# Patient Record
Sex: Male | Born: 1950
Health system: Southern US, Community
[De-identification: ages and names within clinical notes are randomized; demographics above are authoritative.]

## PROBLEM LIST (undated history)

## (undated) DIAGNOSIS — B019 Varicella without complication: Secondary | ICD-10-CM

## (undated) DIAGNOSIS — R7303 Prediabetes: Secondary | ICD-10-CM

## (undated) DIAGNOSIS — E785 Hyperlipidemia, unspecified: Secondary | ICD-10-CM

## (undated) DIAGNOSIS — M549 Dorsalgia, unspecified: Secondary | ICD-10-CM

## (undated) HISTORY — DX: Prediabetes: R73.03

## (undated) HISTORY — DX: Varicella without complication: B01.9

## (undated) HISTORY — DX: Hyperlipidemia, unspecified: E78.5

## (undated) HISTORY — DX: Dorsalgia, unspecified: M54.9

---

## 2006-11-02 ENCOUNTER — Ambulatory Visit: Payer: Self-pay | Admitting: Pulmonary Disease

## 2006-11-09 ENCOUNTER — Ambulatory Visit (HOSPITAL_BASED_OUTPATIENT_CLINIC_OR_DEPARTMENT_OTHER): Admission: RE | Admit: 2006-11-09 | Discharge: 2006-11-09 | Payer: Self-pay | Admitting: Pulmonary Disease

## 2006-11-16 ENCOUNTER — Ambulatory Visit: Payer: Self-pay | Admitting: Pulmonary Disease

## 2006-11-26 ENCOUNTER — Ambulatory Visit (HOSPITAL_COMMUNITY): Admission: RE | Admit: 2006-11-26 | Discharge: 2006-11-26 | Payer: Self-pay | Admitting: Pulmonary Disease

## 2006-12-17 ENCOUNTER — Ambulatory Visit: Payer: Self-pay | Admitting: Pulmonary Disease

## 2008-05-05 HISTORY — PX: OTHER SURGICAL HISTORY: SHX169

## 2008-05-16 ENCOUNTER — Emergency Department (HOSPITAL_BASED_OUTPATIENT_CLINIC_OR_DEPARTMENT_OTHER): Admission: EM | Admit: 2008-05-16 | Discharge: 2008-05-16 | Payer: Self-pay | Admitting: Emergency Medicine

## 2008-05-16 ENCOUNTER — Ambulatory Visit: Payer: Self-pay | Admitting: Diagnostic Radiology

## 2009-11-02 ENCOUNTER — Ambulatory Visit (HOSPITAL_BASED_OUTPATIENT_CLINIC_OR_DEPARTMENT_OTHER): Admission: RE | Admit: 2009-11-02 | Discharge: 2009-11-02 | Payer: Self-pay | Admitting: Orthopedic Surgery

## 2010-04-12 ENCOUNTER — Ambulatory Visit
Admission: RE | Admit: 2010-04-12 | Discharge: 2010-04-12 | Payer: Self-pay | Source: Home / Self Care | Attending: Orthopedic Surgery | Admitting: Orthopedic Surgery

## 2010-06-24 NOTE — Op Note (Signed)
  Justin Boyd, Justin Boyd              ACCOUNT NO.:  [id number]  MEDICAL RECORD NO.:  [id number]          PATIENT TYPE:  AMB  LOCATION:  DSC                          FACILITY:  [facility]  PHYSICIAN:  Cindee Salt, M.D.       DATE OF BIRTH:  December 19, 1950  DATE OF PROCEDURE:  04/12/2010 DATE OF DISCHARGE:                              OPERATIVE REPORT   PREOPERATIVE DIAGNOSIS:  Carpal tunnel syndrome, right hand.  POSTOPERATIVE DIAGNOSIS:  Carpal tunnel syndrome, right hand.  OPERATION:  Decompression of right median nerve.  SURGEON:  Cindee Salt, MD  ASSISTANT:  None.  ANESTHESIA:  Forearm-based IV regional with local infiltration.  ANESTHESIOLOGIST:  Germaine Pomfret, MD  HISTORY:  The patient is a 60 year old male with a history of carpal tunnel syndrome, EMG nerve conduction is positive, which has not responded to conservative treatment.  He has elected to undergo surgical decompression.  Pre, peri, and postoperative course have been discussed along with risks and complications.  He is aware that there is no guarantee with the surgery, possibility of infection, recurrence of injury to arteries, nerves, tendons, incomplete relief of symptoms, and dystrophy.  In the preoperative area, the patient is seen, the extremity marked by both the patient and surgeon, and antibiotic given.  PROCEDURE:  The patient was brought to the operating room where a forearm-based IV regional anesthetic was carried out without difficulty, was prepped using ChloraPrep, supine position with the right arm free. A 3-minute dry time was allowed.  Time-out taken confirming the patient and procedure.  A longitudinal incision was made in the palm and carried down through the subcutaneous tissue.  Bleeders were electrocauterized. Palmar fascia was split.  Superficial palmar arch identified.  The flexor tendon to the ring little finger identified.  To the ulnar side of the median nerve, the carpal retinaculum was  incised with sharp dissection.  A right-angle and Sewall retractor were placed between the skin and forearm fascia.  The fascia was released for approximately a 1.5 cm proximal to the wrist crease under direct vision.  The canal was explored.  The area of compression to the median nerve was apparent.  No further lesions were identified.  The wound was irrigated.  The skin was closed with interrupted 5-0 Vicryl Rapide sutures.  A local infiltration was given during the procedure with 0.25% Marcaine without epinephrine, approximately 5 mL was used.  Sterile compressive dressing and splint with the wrist fingers free was applied.  The patient tolerated the procedure well and was taken to the recovery room for observation in satisfactory condition.  He will be discharged to home to return to the [facility] [place] in 1 week, on [facility]win NX.          ______________________________ Cindee Salt, M.D.     GK/MEDQ  D:  04/12/2010  T:  04/13/2010  Job:  [id number]  Electronically Signed by Cindee Salt M.D. on 06/24/2010 12:33:24 PM

## 2010-08-20 ENCOUNTER — Ambulatory Visit (HOSPITAL_BASED_OUTPATIENT_CLINIC_OR_DEPARTMENT_OTHER)
Admission: RE | Admit: 2010-08-20 | Discharge: 2010-08-20 | Disposition: A | Discharge status: Home / Self Care | Payer: BC Managed Care – PPO | Source: Ambulatory Visit | Attending: Orthopedic Surgery | Admitting: Orthopedic Surgery

## 2010-08-20 DIAGNOSIS — M65849 Other synovitis and tenosynovitis, unspecified hand: Secondary | ICD-10-CM | POA: Insufficient documentation

## 2010-08-20 DIAGNOSIS — F17200 Nicotine dependence, unspecified, uncomplicated: Secondary | ICD-10-CM | POA: Insufficient documentation

## 2010-08-20 DIAGNOSIS — Z01812 Encounter for preprocedural laboratory examination: Secondary | ICD-10-CM | POA: Insufficient documentation

## 2010-08-20 DIAGNOSIS — M65839 Other synovitis and tenosynovitis, unspecified forearm: Secondary | ICD-10-CM | POA: Insufficient documentation

## 2010-08-20 DIAGNOSIS — M6530 Trigger finger, unspecified finger: Secondary | ICD-10-CM | POA: Insufficient documentation

## 2010-08-20 LAB — POCT HEMOGLOBIN-HEMACUE: Hemoglobin: 16.5 g/dL (ref 13.0–17.0)

## 2010-09-17 NOTE — Procedures (Signed)
NAMEBRETT, Justin Boyd              ACCOUNT NO.:  [id number]   MEDICAL RECORD NO.:  [id number]          PATIENT TYPE:  OUT   LOCATION:  SLEEP CENTER                 FACILITY:  [facility]   PHYSICIAN:  Barbaraann Share, MD,FCCPDATE OF BIRTH:  02[birth year]18-1951   DATE OF STUDY:  11/09/2006                            NOCTURNAL POLYSOMNOGRAM   REFERRING PHYSICIAN:  Barbaraann Share, MD,FCCP   INDICATION FOR STUDY:  Hypersomnia with sleep apnea, also history of  seizure disorder.   EPWORTH SLEEPINESS SCORE:  10.   MEDICATIONS:   SLEEP ARCHITECTURE:  The patient had total sleep time of 346 minutes  with no slow wave sleep and decreased REM.  Sleep onset latency was  normal and REM onset was prolonged.  Sleep efficiency was decreased at  82%.   RESPIRATORY DATA:  Patient was found to have 19 obstructive hypopneas, 8  obstructive apneas and 4 central apneas for an apnea/hypopnea index of 5  events/hour.  The events were not positional and there was moderate  snoring noted throughout.   OXYGEN DATA:  Patient had O2 desaturation as low as 85% with a few of  his obstructive events.   CARDIAC DATA:  No clinically significant cardiac arrhythmias were noted.   MOVEMENT-PARASOMNIA:  Patient was found to have small numbers of leg  jerks with no significant sleep disruption.  There were also 2  incidences noted where there was questionable seizure activity through  all leads in the expanded seizure montage.   IMPRESSIONS-RECOMMENDATIONS:  1. Very mild obstructive sleep apnea/hypopnea syndrome with an      apnea/hypopnea index of 5 events/hour and O2 desaturation as low as      85%.  Treatment for this may include weight loss alone if      applicable, upper airway surgery, oral appliance, and also CPAP.  2. Questionable seizure activity noted on 2 occasions during the      night.  This is not clear-cut and will be reviewed with a      neurologist.  The patient may ultimately need a sleep deprived EEG    for better clarification.      Barbaraann Share, MD,FCCP  Diplomate, American Board of Sleep  Medicine  Electronically Signed     [facility]/MEDQ  D:  11/18/2006 17:17:24  T:  11/19/2006 12:55:54  Job:  [id number]

## 2010-09-17 NOTE — Procedures (Signed)
EEG#:  [id number].   This is a sleep-deprived EEG in a patient noted to have epileptiform  activity on a sleep study EEG performed in July 2008.   No medications were listed.  This was a sleep-deprived study.   TECHNICAL DESCRIPTION:  This EEG was recorded during the awake state.  There was no stage-II sleep present during this recording.  There was  much low-voltage fast beta activity but the predominant background  rhythm is 14 Hz with higher amplitudes seen in the posterior head  regions like by eye-opening and accentuated by eye-closing maneuvers.  There is muscle artifact seen during much of this EEG.  Photic  stimulation was performed which did not produce a driving response.  Hyperventilation testing was performed without any definite  abnormalities present.  There was some drowsiness in the EEG but there  was no evidence of any epileptiform activity present.  There is much low-  voltage fast beta activity suggestive of medication effect, but none  were listed.   IMPRESSION:  This is a normal EEG during the awake state.  It was a  sleep-deprived study with much low-voltage fast beta activity suggestive  of medication effect.  No definite epileptiform activity was seen on  this study.           ______________________________  Genene Churn. Sandria Manly, M.D.     ZOX:WRUE  D:  11/26/2006 15:14:07  T:  11/27/2006 08:40:45  Job #:  [id number]   cc:   Barbaraann Share, MD,FCCP  [address]  [place]  [place] [address]

## 2010-09-17 NOTE — Procedures (Signed)
Justin Boyd, Justin Boyd              ACCOUNT NO.:  [id number]   MEDICAL RECORD NO.:  [id number]          PATIENT TYPE:  OUT   LOCATION:  SLEEP CENTER                 FACILITY:  [facility]   PHYSICIAN:  Barbaraann Share, MD,FCCPDATE OF BIRTH:  [birth year]Aug-1950   DATE OF STUDY:                            NOCTURNAL POLYSOMNOGRAM   REFERRING PHYSICIAN:   HISTORY OF PRESENT ILLNESS:  The patient is   DICTATION ENDS HERE.      Barbaraann Share, MD,FCCP  Diplomate, American Board of Sleep  Medicine     [facility]/MEDQ  D:  11/18/2006 17:14:02  T:  11/19/2006 12:39:29  Job:  [id number]

## 2010-09-17 NOTE — Assessment & Plan Note (Signed)
[facility]                             PULMONARY OFFICE NOTE   GENIE, MIRABAL                       MRN:          [id number]  DATE:11/02/2006                            DOB:          07/26/1950    The patient is a 60 year old gentleman who I have been asked to see for  unknown sleep issues.  The patient states that for the last 2 years he  has had significant sleepiness during the day with periods of  inactivity.  He works as a Visual merchandiser and feels that his  sleepiness interferes with his work.  He typically goes to bed between  9:30 and 10, and it typically takes him 15-30 minutes to get to sleep  after taking a Tylenol PM.  He typically arises at 4:40 a.m. to start  his day.  The patient states that he will awaken at least 6-8 times a  night and really does not know why.  He goes back to sleep usually  within minutes.  He does not have shortness of breath or GERD symptoms  whenever he arises.  The patient does states the he has leg jerks prior  to going to bed and describes it as an unusual sensation in his legs in  the evenings.  It clearly gets better if he gets up and tries to walk  around.  The patient does have some sleepiness with driving, but has not  been involved in any accidents.  He has lost 15 pounds over the last few  years.   PAST MEDICAL HISTORY:  1. Significant only for episodic vertigo.  2. Barrett's esophagus.  3. Questionable history of a seizure disorder.   CURRENT MEDICATIONS:  1. Tylenol PM 1 daily.  2. Zantac 150 daily.   The patient is intolerant/allergic to PENICILLIN which causes pain.   SOCIAL HISTORY:  The patient is married and does not have children.  He  has a history of smoking for 32 years.   FAMILY HISTORY:  Remarkable for mother and father both having heart  disease.   REVIEW OF SYSTEMS:  As per history of present illness, also see patient  intake form documented in the chart.   PHYSICAL  EXAMINATION:  IN GENERAL:  He is an overweight male in no acute  distress.  Blood pressure is 105/70, pulse 69, temperature 97.9, weight is 189  pounds, O2 saturation on room air is 94%.  HEENT:  Pupils are equal, round, and reactive to light and  accommodation, extraocular muscles are intact.  Nares show no  significant septal deviation.  Oropharynx does show mild elongation of  the soft palate and uvula.  NECK:  Supple without JVD or lymphadenopathy.  There is no palpable  thyromegaly.  CHEST:  Totally clear except for a few rhonchi.  CARDIAC EXAM:  Reveals regular rate and rhythm with no murmurs, rubs, or  gallops.  ABDOMEN:  Soft, nontender, good bowel sounds.  GENITAL/RECTAL/BREAST EXAM:  Not done and not indicated.  LOWER EXTREMITIES:  Without edema.  Pulses are intact distally.  NEUROLOGICAL:  He is alert and  oriented with no obvious motor deficits.   IMPRESSION:  Large number of awakenings during the night of unknown  etiology, and accompanied by poor sleep efficiency and inappropriate  daytime sleepiness which is interfering with his quality of life.  The  patient is not sure if he snores, and denies any gasping arousals.  He  does have leg jerks during the night and symptoms in the early evening  that may be consistent with restless leg syndrome.  He also has a  possible history of seizures, and I would wonder whether this is  spilling over into the evening.  At this point in time, he clearly needs  nocturnal polysomnogram to help with differential diagnosis.   PLAN:  1. Will order nocturnal polysomnogram with expanded seizure montage,      and also arm leads to rule out REM behavior disorder.  2. The patient will follow up after the above.     Barbaraann Share, MD,FCCP  Electronically Signed    KMC/MedQ  DD: 11/24/2006  DT: 11/24/2006  Job #: [id number]   cc:   Lieutenant Diego, MD, M.P.H.

## 2010-11-22 NOTE — Op Note (Signed)
  Justin Boyd, Justin Boyd              ACCOUNT NO.:  [id number]  MEDICAL RECORD NO.:  [id number]           PATIENT TYPE:  LOCATION:                                 FACILITY:  PHYSICIAN:  Cindee Salt, M.D.       DATE OF BIRTH:  December 30, 1950  DATE OF PROCEDURE:  08/20/2010 DATE OF DISCHARGE:                              OPERATIVE REPORT   PREOPERATIVE DIAGNOSIS:  Stenosing tenosynovitis, right thumb.  POSTOPERATIVE DIAGNOSIS:  Stenosing tenosynovitis, right thumb.  OPERATION:  Release A1 pulley, right thumb.  SURGEON:  Cindee Salt, MD  ASSISTANT:  Betha Loa, MD  ANESTHESIA:  Forearm-based IV regional with local infiltration.  HISTORY:  The patient is a 60 year old male with history of triggering of his right thumb.  This has not responded to conservative treatment, and he has elected to undergo surgical release of the A1 pulley.  Pre, peri, and postoperative course have been discussed along with risks and complications.  He is aware that there is no guarantee with the surgery, possibility of infection, recurrence, injury to arteries, nerves, tendons, incomplete relief of symptoms, dystrophy.  In the preoperative area, the patient was seen.  The extremity was marked by both the patient and surgeon, antibiotic given.  PROCEDURE:  The patient was brought to the operating room where forearm- based IV regional anesthetic was carried out without difficulty.  He was prepped using ChloraPrep in supine position, right arm free, 3-minute dry time was allowed.  Time-out taken confirming the patient and the procedure.  A transverse incision was made over the A1 pulley of the right thumb, carried down through subcutaneous tissue.  Neurovascular structures identified and protected, retractors were placed.  A1 pulley was then released on its radial aspect.  It was found to be extremely tight, the oblique pulley was left intact.  No further lesions were identified.  Thumb was placed through full  range motion, no further triggering was noted.  The wound was irrigated and closed with interrupted 5-0 Vicryl Rapide suture.  Local infiltration with 0.25% Marcaine without epinephrine was given, approximately 4 mL was used.  A sterile compressive dressing with thumb free was applied.  On inflation of the tourniquet, all fingers immediately pinked.  He was taken to the recovery room for observation in satisfactory condition.  He will be discharged home to return to the [facility] [place] in 1 week on [place]win NX.          ______________________________ Cindee Salt, M.D.     GK/MEDQ  D:  08/20/2010  T:  08/20/2010  Job:  [id number]  Electronically Signed by Cindee Salt M.D. on 11/22/2010 09:09:27 AM

## 2011-02-27 DIAGNOSIS — E785 Hyperlipidemia, unspecified: Secondary | ICD-10-CM

## 2011-02-27 HISTORY — DX: Hyperlipidemia, unspecified: E78.5

## 2011-12-04 DIAGNOSIS — K5790 Diverticulosis of intestine, part unspecified, without perforation or abscess without bleeding: Secondary | ICD-10-CM | POA: Insufficient documentation

## 2011-12-04 HISTORY — DX: Diverticulosis of intestine, part unspecified, without perforation or abscess without bleeding: K57.90

## 2014-05-05 HISTORY — PX: SHOULDER SURGERY: SHX246

## 2017-01-04 DIAGNOSIS — M545 Low back pain: Secondary | ICD-10-CM | POA: Diagnosis not present

## 2017-04-06 DIAGNOSIS — M7712 Lateral epicondylitis, left elbow: Secondary | ICD-10-CM | POA: Diagnosis not present

## 2017-04-17 DIAGNOSIS — M25522 Pain in left elbow: Secondary | ICD-10-CM | POA: Diagnosis not present

## 2017-04-17 DIAGNOSIS — M7712 Lateral epicondylitis, left elbow: Secondary | ICD-10-CM | POA: Diagnosis not present

## 2017-04-26 DIAGNOSIS — R0781 Pleurodynia: Secondary | ICD-10-CM | POA: Diagnosis not present

## 2017-04-26 DIAGNOSIS — S299XXA Unspecified injury of thorax, initial encounter: Secondary | ICD-10-CM | POA: Diagnosis not present

## 2017-04-26 DIAGNOSIS — R0789 Other chest pain: Secondary | ICD-10-CM | POA: Diagnosis not present

## 2017-04-26 DIAGNOSIS — T148XXA Other injury of unspecified body region, initial encounter: Secondary | ICD-10-CM | POA: Diagnosis not present

## 2017-04-26 DIAGNOSIS — W19XXXA Unspecified fall, initial encounter: Secondary | ICD-10-CM | POA: Diagnosis not present

## 2017-04-29 DIAGNOSIS — M7712 Lateral epicondylitis, left elbow: Secondary | ICD-10-CM | POA: Diagnosis not present

## 2017-04-29 DIAGNOSIS — M25522 Pain in left elbow: Secondary | ICD-10-CM | POA: Diagnosis not present

## 2017-05-06 DIAGNOSIS — M25522 Pain in left elbow: Secondary | ICD-10-CM | POA: Diagnosis not present

## 2017-05-06 DIAGNOSIS — M7712 Lateral epicondylitis, left elbow: Secondary | ICD-10-CM | POA: Diagnosis not present

## 2017-05-13 DIAGNOSIS — M25522 Pain in left elbow: Secondary | ICD-10-CM | POA: Diagnosis not present

## 2017-05-13 DIAGNOSIS — M7712 Lateral epicondylitis, left elbow: Secondary | ICD-10-CM | POA: Diagnosis not present

## 2017-07-15 DIAGNOSIS — Z713 Dietary counseling and surveillance: Secondary | ICD-10-CM | POA: Diagnosis not present

## 2017-11-13 DIAGNOSIS — Z79899 Other long term (current) drug therapy: Secondary | ICD-10-CM | POA: Diagnosis not present

## 2017-11-13 DIAGNOSIS — Z5181 Encounter for therapeutic drug level monitoring: Secondary | ICD-10-CM | POA: Diagnosis not present

## 2018-02-01 DIAGNOSIS — N289 Disorder of kidney and ureter, unspecified: Secondary | ICD-10-CM | POA: Diagnosis not present

## 2018-02-01 DIAGNOSIS — K6389 Other specified diseases of intestine: Secondary | ICD-10-CM | POA: Diagnosis not present

## 2018-02-01 DIAGNOSIS — N2889 Other specified disorders of kidney and ureter: Secondary | ICD-10-CM | POA: Diagnosis not present

## 2018-02-01 DIAGNOSIS — K2270 Barrett's esophagus without dysplasia: Secondary | ICD-10-CM | POA: Diagnosis not present

## 2018-02-01 DIAGNOSIS — Z86010 Personal history of colonic polyps: Secondary | ICD-10-CM | POA: Diagnosis not present

## 2018-02-01 DIAGNOSIS — I77811 Abdominal aortic ectasia: Secondary | ICD-10-CM | POA: Diagnosis not present

## 2018-02-01 DIAGNOSIS — R1010 Upper abdominal pain, unspecified: Secondary | ICD-10-CM | POA: Diagnosis not present

## 2018-02-01 DIAGNOSIS — K219 Gastro-esophageal reflux disease without esophagitis: Secondary | ICD-10-CM | POA: Diagnosis not present

## 2018-02-05 DIAGNOSIS — K2930 Chronic superficial gastritis without bleeding: Secondary | ICD-10-CM | POA: Diagnosis not present

## 2018-02-05 DIAGNOSIS — K2270 Barrett's esophagus without dysplasia: Secondary | ICD-10-CM | POA: Diagnosis not present

## 2018-02-05 DIAGNOSIS — R933 Abnormal findings on diagnostic imaging of other parts of digestive tract: Secondary | ICD-10-CM | POA: Diagnosis not present

## 2018-02-05 DIAGNOSIS — K210 Gastro-esophageal reflux disease with esophagitis: Secondary | ICD-10-CM | POA: Diagnosis not present

## 2018-02-05 DIAGNOSIS — K317 Polyp of stomach and duodenum: Secondary | ICD-10-CM | POA: Diagnosis not present

## 2018-02-05 DIAGNOSIS — R1010 Upper abdominal pain, unspecified: Secondary | ICD-10-CM | POA: Diagnosis not present

## 2018-02-05 DIAGNOSIS — K3189 Other diseases of stomach and duodenum: Secondary | ICD-10-CM | POA: Diagnosis not present

## 2018-02-05 DIAGNOSIS — K2950 Unspecified chronic gastritis without bleeding: Secondary | ICD-10-CM | POA: Diagnosis not present

## 2018-02-05 DIAGNOSIS — K449 Diaphragmatic hernia without obstruction or gangrene: Secondary | ICD-10-CM | POA: Diagnosis not present

## 2018-02-18 DIAGNOSIS — N289 Disorder of kidney and ureter, unspecified: Secondary | ICD-10-CM | POA: Diagnosis not present

## 2018-02-18 DIAGNOSIS — K869 Disease of pancreas, unspecified: Secondary | ICD-10-CM | POA: Diagnosis not present

## 2018-02-18 DIAGNOSIS — N2889 Other specified disorders of kidney and ureter: Secondary | ICD-10-CM | POA: Diagnosis not present

## 2018-02-18 DIAGNOSIS — K8689 Other specified diseases of pancreas: Secondary | ICD-10-CM | POA: Diagnosis not present

## 2018-03-26 ENCOUNTER — Ambulatory Visit (INDEPENDENT_AMBULATORY_CARE_PROVIDER_SITE_OTHER): Payer: BLUE CROSS/BLUE SHIELD | Admitting: Family Medicine

## 2018-03-26 ENCOUNTER — Encounter: Payer: Self-pay | Admitting: Family Medicine

## 2018-03-26 ENCOUNTER — Ambulatory Visit: Payer: Self-pay | Admitting: Family Medicine

## 2018-03-26 ENCOUNTER — Ambulatory Visit (INDEPENDENT_AMBULATORY_CARE_PROVIDER_SITE_OTHER): Payer: BLUE CROSS/BLUE SHIELD

## 2018-03-26 VITALS — BP 160/82 | HR 83 | Ht 64.0 in | Wt 194.4 lb

## 2018-03-26 DIAGNOSIS — Z23 Encounter for immunization: Secondary | ICD-10-CM

## 2018-03-26 DIAGNOSIS — R0602 Shortness of breath: Secondary | ICD-10-CM

## 2018-03-26 DIAGNOSIS — R9389 Abnormal findings on diagnostic imaging of other specified body structures: Secondary | ICD-10-CM | POA: Diagnosis not present

## 2018-03-26 DIAGNOSIS — Z0001 Encounter for general adult medical examination with abnormal findings: Secondary | ICD-10-CM

## 2018-03-26 DIAGNOSIS — Z720 Tobacco use: Secondary | ICD-10-CM

## 2018-03-26 DIAGNOSIS — R030 Elevated blood-pressure reading, without diagnosis of hypertension: Secondary | ICD-10-CM

## 2018-03-26 MED ORDER — VARENICLINE TARTRATE 0.5 MG X 11 & 1 MG X 42 PO MISC: ORAL | 0 refills | Status: DC

## 2018-03-26 NOTE — Progress Notes (Addendum)
New Patient Office Visit  Subjective:  Patient ID: Justin Boyd, male    DOB: Jul 22, 1950  Age: 67 y.o. MRN: [id number]  CC:  Chief Complaint  Patient presents with  . Establish Care    HPI Mirza Fessel presents for establishment of care. He is married and lives with his wife. Both of his parents are deceased as well as her sister. His brother is still living. Patient is in agreement to get his first pneumonia vaccine and would also like the new shingles vaccine. Patient has regular insurance, not medicare. He has already had his flu vaccine and had a colonoscopy in 2013.  Patient is here today for a physical exam and to establish care.  He is not fasting.  He continues to work.  He wants to quit smoking and is interested in the possibility of taking Chantix to help him quit.  He has read about it online.  His exercise capacity has been due to shortness of breath he experienced on exertion.  He does feel his heart race at times with this.  He has no chest pain nausea or diaphoresis with exertion.  His cholesterol has been treated in the past but he was lost to follow-up.  He has no history of hypertension.  Recently his blood pressure has been up and down.  Last colonoscopy was 3 years ago.  He is on the 5-year plan with [facility] in in [place].  Endoscopy 2 years ago was normal.  He takes Protonix a regular basis.    Past Medical History:  Diagnosis Date  . Chicken pox   . Hyperlipidemia     Past Surgical History:  Procedure Laterality Date  . carpal tunnel both hands  2010  . SHOULDER SURGERY Right 2016   History reviewed. No pertinent family history.  History reviewed. No pertinent family history.  Social History   Socioeconomic History  . Marital status: Married    Spouse name: Not on file  . Number of children: Not on file  . Years of education: Not on file  . Highest education level: Not on file  Occupational History  . Not on file  Social Needs  . Financial  resource strain: Not on file  . Food insecurity:    Worry: Not on file    Inability: Not on file  . Transportation needs:    Medical: Not on file    Non-medical: Not on file  Tobacco Use  . Smoking status: Current Every Day Smoker  . Smokeless tobacco: Never Used  Substance and Sexual Activity  . Alcohol use: Yes  . Drug use: Never  . Sexual activity: Yes    Partners: Female  Lifestyle  . Physical activity:    Days per week: Not on file    Minutes per session: Not on file  . Stress: Not on file  Relationships  . Social connections:    Talks on phone: Not on file    Gets together: Not on file    Attends religious service: Not on file    Active member of club or organization: Not on file    Attends meetings of clubs or organizations: Not on file    Relationship status: Not on file  . Intimate partner violence:    Fear of current or ex partner: Not on file    Emotionally abused: Not on file    Physically abused: Not on file    Forced sexual activity: Not on file  Other Topics  Concern  . Not on file  Social History Narrative  . Not on file    ROS Review of Systems  Constitutional: Negative for chills, diaphoresis, fatigue, fever and unexpected weight change.  HENT: Negative.   Eyes: Negative for photophobia and visual disturbance.  Respiratory: Positive for shortness of breath. Negative for chest tightness and wheezing.   Cardiovascular: Positive for palpitations. Negative for chest pain and leg swelling.  Gastrointestinal: Negative.   Endocrine: Negative for polyphagia and polyuria.  Genitourinary: Positive for difficulty urinating.  Musculoskeletal: Negative for gait problem and joint swelling.  Skin: Negative for pallor and rash.  Allergic/Immunologic: Negative for immunocompromised state.  Neurological: Negative for light-headedness, numbness and headaches.  Hematological: Does not bruise/bleed easily.  Psychiatric/Behavioral: Negative.     Objective:    Today's Vitals: BP (!) 160/82 (BP Location: Left Arm, Patient Position: Sitting, Cuff Size: Normal)   Pulse 83   Ht 5\' 4"  (1.626 m)   Wt 194 lb 6 oz (88.2 kg)   SpO2 92%   BMI 33.36 kg/m   Physical Exam  Constitutional: He is oriented to person, place, and time. He appears well-developed and well-nourished. No distress.  HENT:  Head: Normocephalic and atraumatic.  Right Ear: External ear normal.  Left Ear: External ear normal.  Mouth/Throat: Oropharynx is clear and moist. No oropharyngeal exudate.  Eyes: Pupils are equal, round, and reactive to light. Conjunctivae and EOM are normal. Right eye exhibits no discharge. Left eye exhibits no discharge. No scleral icterus.  Neck: Neck supple. No JVD present. No tracheal deviation present. No thyromegaly present.  Cardiovascular: Normal rate, regular rhythm and normal heart sounds.  Pulmonary/Chest: Effort normal.  Abdominal: Soft. Bowel sounds are normal. He exhibits no distension and no mass. There is no tenderness. There is no rebound and no guarding. A hernia is present. Hernia confirmed positive in the ventral area.  Genitourinary: Rectal exam shows no external hemorrhoid, no internal hemorrhoid, no fissure, no mass, no tenderness, anal tone normal and guaiac negative stool. Prostate is not enlarged and not tender.  Lymphadenopathy:    He has no cervical adenopathy.  Neurological: He is alert and oriented to person, place, and time.  Skin: Skin is warm and dry. He is not diaphoretic.  Psychiatric: He has a normal mood and affect.  The 10-year ASCVD risk score Mikey Bussing DC Brooke Bonito., et al., 2013) is: 30.9%   Values used to calculate the score:     Age: 89 years     Sex: Male     Is Non-Hispanic African American: No     Diabetic: No     Tobacco smoker: Yes     Systolic Blood Pressure: 751 mmHg     Is BP treated: No     HDL Cholesterol: 34.6 mg/dL     Total Cholesterol: 211 mg/dL  Assessment & Plan:   Problem List Items Addressed This  Visit      Other   Encounter for health maintenance examination with abnormal findings - Primary   Relevant Orders   CBC (Completed)   Comprehensive metabolic panel (Completed)   LDL cholesterol, direct (Completed)   Lipid panel (Completed)   PSA (Completed)   TSH (Completed)   VITAMIN D 25 Hydroxy (Vit-D Deficiency, Fractures) (Completed)   SOB (shortness of breath) on exertion   Relevant Orders   DG Chest 2 View (Completed)   Ambulatory referral to Pulmonology   Elevated BP without diagnosis of hypertension   Need for 23-polyvalent pneumococcal polysaccharide vaccine  Relevant Orders   Pneumococcal polysaccharide vaccine 23-valent greater than or equal to 2yo subcutaneous/IM (Completed)   Tobacco abuse   Relevant Medications   varenicline (CHANTIX PAK) 0.5 MG X 11 & 1 MG X 42 tablet   Abnormal CXR      Outpatient Encounter Medications as of 03/26/2018  Medication Sig  . pantoprazole (PROTONIX) 40 MG tablet Take 40 mg by mouth daily.  . varenicline (CHANTIX PAK) 0.5 MG X 11 & 1 MG X 42 tablet Take one 0.5 mg tablet by mouth once daily for 3 days, then increase to one 0.5 mg tablet twice daily for 4 days, then increase to one 1 mg tablet twice daily.   No facility-administered encounter medications on file as of 03/26/2018.    We discussed starting Chantix 1 week prior to his quit date.  He is aware of the possible side effects of depression and homicidal ideation.  If these occur he will stop the medication and inform me immediately.  He is to follow-up with me 1 month after starting the medicine.  Advised him to return for a blood pressure check in 2 weeks.  He will have his blood drawn med center.  He was given anticipatory guidance on health maintenance and disease prevention.  Given information on the DASH diet. Follow-up: Return in about 2 weeks (around 04/09/2018).

## 2018-03-26 NOTE — Patient Instructions (Addendum)
You received two vaccines today; your 1st pneumonia vaccine and your 1st shingles vaccine. Pain, redness, and swelling at the injection site, muscle pain, tiredness, headache, shivering, fever, and upset stomach are all common side effects of SHINGRIX. You will receive your second pneumonia vaccine in one year, and your second shingles vaccine in 2-6 months. Recombinant Zoster (Shingles) Vaccine, RZV: What You Need to Know 1. Why get vaccinated? Shingles (also called herpes zoster, or just zoster) is a painful skin rash, often with blisters. Shingles is caused by the varicella zoster virus, the same virus that causes chickenpox. After you have chickenpox, the virus stays in your body and can cause shingles later in life. You can't catch shingles from another person. However, a person who has never had chickenpox (or chickenpox vaccine) could get chickenpox from someone with shingles. A shingles rash usually appears on one side of the face or body and heals within 2 to 4 weeks. Its main symptom is pain, which can be severe. Other symptoms can include fever, headache, chills and upset stomach. Very rarely, a shingles infection can lead to pneumonia, hearing problems, blindness, brain inflammation (encephalitis), or death. For about 1 person in 5, severe pain can continue even long after the rash has cleared up. This long-lasting pain is called post-herpetic neuralgia (PHN). Shingles is far more common in people 6 years of age and older than in younger people, and the risk increases with age. It is also more common in people whose immune system is weakened because of a disease such as cancer, or by drugs such as steroids or chemotherapy. At least 1 million people a year in the [place] get shingles. 2. Shingles vaccine (recombinant) Recombinant shingles vaccine was approved by FDA in 2017 for the prevention of shingles. In clinical trials, it was more than 90% effective in preventing shingles. It can  also reduce the likelihood of PHN. Two doses, 2 to 6 months apart, are recommended for adults 70 and older. This vaccine is also recommended for people who have already gotten the live shingles vaccine (Zostavax). There is no live virus in this vaccine. 3. Some people should not get this vaccine Tell your vaccine provider if you:  Have any severe, life-threatening allergies. A person who has ever had a life-threatening allergic reaction after a dose of recombinant shingles vaccine, or has a severe allergy to any component of this vaccine, may be advised not to be vaccinated. Ask your health care provider if you want information about vaccine components.  Are pregnant or breastfeeding. There is not much information about use of recombinant shingles vaccine in pregnant or nursing women. Your healthcare provider might recommend delaying vaccination.  Are not feeling well. If you have a mild illness, such as a cold, you can probably get the vaccine today. If you are moderately or severely ill, you should probably wait until you recover. Your doctor can advise you.  4. Risks of a vaccine reaction With any medicine, including vaccines, there is a chance of reactions. After recombinant shingles vaccination, a person might experience:  Pain, redness, soreness, or swelling at the site of the injection  Headache, muscle aches, fever, shivering, fatigue  In clinical trials, most people got a sore arm with mild or moderate pain after vaccination, and some also had redness and swelling where they got the shot. Some people felt tired, had muscle pain, a headache, shivering, fever, stomach pain, or nausea. About 1 out of 6 people who got recombinant zoster vaccine  experienced side effects that prevented them from doing regular activities. Symptoms went away on their own in about 2 to 3 days. Side effects were more common in younger people. You should still get the second dose of recombinant zoster vaccine even  if you had one of these reactions after the first dose. Other things that could happen after this vaccine:  People sometimes faint after medical procedures, including vaccination. Sitting or lying down for about 15 minutes can help prevent fainting and injuries caused by a fall. Tell your provider if you feel dizzy or have vision changes or ringing in the ears.  Some people get shoulder pain that can be more severe and longer-lasting than routine soreness that can follow injections. This happens very rarely.  Any medication can cause a severe allergic reaction. Such reactions to a vaccine are estimated at about 1 in a million doses, and would happen within a few minutes to a few hours after the vaccination. As with any medicine, there is a very remote chance of a vaccine causing a serious injury or death. The safety of vaccines is always being monitored. For more information, visit: http://www.aguilar.org/ 5. What if there is a serious problem? What should I look for?  Look for anything that concerns you, such as signs of a severe allergic reaction, very high fever, or unusual behavior. Signs of a severe allergic reaction can include hives, swelling of the face and throat, difficulty breathing, a fast heartbeat, dizziness, and weakness. These would usually start a few minutes to a few hours after the vaccination. What should I do?  If you think it is a severe allergic reaction or other emergency that can't wait, call 9-1-1 and get to the nearest hospital. Otherwise, call your health care provider. Afterward, the reaction should be reported to the Vaccine Adverse Event Reporting System (VAERS). Your doctor should file this report, or you can do it yourself through the VAERS web site atwww.vaers.https://www.bray.com/ by calling 256-234-2919. VAERS does not give medical advice. 6. How can I learn more?  Ask your healthcare provider. He or she can give you the vaccine package insert or suggest other  sources of information.  Call your local or state health department.  Contact the Centers for Disease Control and Prevention (CDC): ? Call (210) 569-9227 (1-800-CDC-INFO) or ? Visit the CDC's website at http://hunter.com/ CDC Vaccine Information Statement (VIS) Recombinant Zoster Vaccine (06/16/2016) This information is not intended to replace advice given to you by your health care provider. Make sure you discuss any questions you have with your health care provider. Document Released: 07/01/2016 Document Revised: 07/01/2016 Document Reviewed: 07/01/2016 Elsevier Interactive Patient Education  2018 [place] Maintenance, Male A healthy lifestyle and preventive care is important for your health and wellness. Ask your health care provider about what schedule of regular examinations is right for you. What should I know about weight and diet? Eat a Healthy Diet  Eat plenty of vegetables, fruits, whole grains, low-fat dairy products, and lean protein.  Do not eat a lot of foods high in solid fats, added sugars, or salt.  Maintain a Healthy Weight Regular exercise can help you achieve or maintain a healthy weight. You should:  Do at least 150 minutes of exercise each week. The exercise should increase your heart rate and make you sweat (moderate-intensity exercise).  Do strength-training exercises at least twice a week.  Watch Your Levels of Cholesterol and Blood Lipids  Have your blood tested for lipids and cholesterol every  5 years starting at 65 years of age. If you are at high risk for heart disease, you should start having your blood tested when you are 65 years old. You may need to have your cholesterol levels checked more often if: ? Your lipid or cholesterol levels are high. ? You are older than 65 years of age. ? You are at high risk for heart disease.  What should I know about cancer screening? Many types of cancers can be detected early and may often be  prevented. Lung Cancer  You should be screened every year for lung cancer if: ? You are a current smoker who has smoked for at least 30 years. ? You are a former smoker who has quit within the past 15 years.  Talk to your health care provider about your screening options, when you should start screening, and how often you should be screened.  Colorectal Cancer  Routine colorectal cancer screening usually begins at 65 years of age and should be repeated every 5-10 years until you are 65 years old. You may need to be screened more often if early forms of precancerous polyps or small growths are found. Your health care provider may recommend screening at an earlier age if you have risk factors for colon cancer.  Your health care provider may recommend using home test kits to check for hidden blood in the stool.  A small camera at the end of a tube can be used to examine your colon (sigmoidoscopy or colonoscopy). This checks for the earliest forms of colorectal cancer.  Prostate and Testicular Cancer  Depending on your age and overall health, your health care provider may do certain tests to screen for prostate and testicular cancer.  Talk to your health care provider about any symptoms or concerns you have about testicular or prostate cancer.  Skin Cancer  Check your skin from head to toe regularly.  Tell your health care provider about any new moles or changes in moles, especially if: ? There is a change in a mole's size, shape, or color. ? You have a mole that is larger than a pencil eraser.  Always use sunscreen. Apply sunscreen liberally and repeat throughout the day.  Protect yourself by wearing long sleeves, pants, a wide-brimmed hat, and sunglasses when outside.  What should I know about heart disease, diabetes, and high blood pressure?  If you are 68-21 years of age, have your blood pressure checked every 3-5 years. If you are 70 years of age or older, have your blood  pressure checked every year. You should have your blood pressure measured twice-once when you are at a hospital or clinic, and once when you are not at a hospital or clinic. Record the average of the two measurements. To check your blood pressure when you are not at a hospital or clinic, you can use: ? An automated blood pressure machine at a pharmacy. ? A home blood pressure monitor.  Talk to your health care provider about your target blood pressure.  If you are between 3-22 years old, ask your health care provider if you should take aspirin to prevent heart disease.  Have regular diabetes screenings by checking your fasting blood sugar level. ? If you are at a normal weight and have a low risk for diabetes, have this test once every three years after the age of 92. ? If you are overweight and have a high risk for diabetes, consider being tested at a younger age or more  often.  A one-time screening for abdominal aortic aneurysm (AAA) by ultrasound is recommended for men aged 86-75 years who are current or former smokers. What should I know about preventing infection? Hepatitis B If you have a higher risk for hepatitis B, you should be screened for this virus. Talk with your health care provider to find out if you are at risk for hepatitis B infection. Hepatitis C Blood testing is recommended for:  Everyone born from [birth year] through 1963.  Anyone with known risk factors for hepatitis C.  Sexually Transmitted Diseases (STDs)  You should be screened each year for STDs including gonorrhea and chlamydia if: ? You are sexually active and are younger than 65 years of age. ? You are older than 65 years of age and your health care provider tells you that you are at risk for this type of infection. ? Your sexual activity has changed since you were last screened and you are at an increased risk for chlamydia or gonorrhea. Ask your health care provider if you are at risk.  Talk with your health  care provider about whether you are at high risk of being infected with HIV. Your health care provider may recommend a prescription medicine to help prevent HIV infection.  What else can I do?  Schedule regular health, dental, and eye exams.  Stay current with your vaccines (immunizations).  Do not use any tobacco products, such as cigarettes, chewing tobacco, and e-cigarettes. If you need help quitting, ask your health care provider.  Limit alcohol intake to no more than 2 drinks per day. One drink equals 12 ounces of beer, 5 ounces of wine, or 1 ounces of hard liquor.  Do not use street drugs.  Do not share needles.  Ask your health care provider for help if you need support or information about quitting drugs.  Tell your health care provider if you often feel depressed.  Tell your health care provider if you have ever been abused or do not feel safe at home. This information is not intended to replace advice given to you by your health care provider. Make sure you discuss any questions you have with your health care provider. Document Released: 10/18/2007 Document Revised: 12/19/2015 Document Reviewed: 01/23/2015 Elsevier Interactive Patient Education  2018 [place] Eating Plan DASH stands for "Dietary Approaches to Stop Hypertension." The DASH eating plan is a healthy eating plan that has been shown to reduce high blood pressure (hypertension). It may also reduce your risk for type 2 diabetes, heart disease, and stroke. The DASH eating plan may also help with weight loss. What are tips for following this plan? General guidelines  Avoid eating more than 2,300 mg (milligrams) of salt (sodium) a day. If you have hypertension, you may need to reduce your sodium intake to 1,500 mg a day.  Limit alcohol intake to no more than 1 drink a day for nonpregnant women and 2 drinks a day for men. One drink equals 12 oz of beer, 5 oz of wine, or 1 oz of hard liquor.  Work with  your health care provider to maintain a healthy body weight or to lose weight. Ask what an ideal weight is for you.  Get at least 30 minutes of exercise that causes your heart to beat faster (aerobic exercise) most days of the week. Activities may include walking, swimming, or biking.  Work with your health care provider or diet and nutrition specialist (dietitian) to adjust your eating plan to  your individual calorie needs. Reading food labels  Check food labels for the amount of sodium per serving. Choose foods with less than 5 percent of the Daily Value of sodium. Generally, foods with less than 300 mg of sodium per serving fit into this eating plan.  To find whole grains, look for the word "whole" as the first word in the ingredient list. Shopping  Buy products labeled as "low-sodium" or "no salt added."  Buy fresh foods. Avoid canned foods and premade or frozen meals. Cooking  Avoid adding salt when cooking. Use salt-free seasonings or herbs instead of table salt or sea salt. Check with your health care provider or pharmacist before using salt substitutes.  Do not fry foods. Cook foods using healthy methods such as baking, boiling, grilling, and broiling instead.  Cook with heart-healthy oils, such as olive, canola, soybean, or sunflower oil. Meal planning   Eat a balanced diet that includes: ? 5 or more servings of fruits and vegetables each day. At each meal, try to fill half of your plate with fruits and vegetables. ? Up to 6-8 servings of whole grains each day. ? Less than 6 oz of lean meat, poultry, or fish each day. A 3-oz serving of meat is about the same size as a deck of cards. One egg equals 1 oz. ? 2 servings of low-fat dairy each day. ? A serving of nuts, seeds, or beans 5 times each week. ? Heart-healthy fats. Healthy fats called Omega-3 fatty acids are found in foods such as flaxseeds and coldwater fish, like sardines, salmon, and mackerel.  Limit how much you eat  of the following: ? Canned or prepackaged foods. ? Food that is high in trans fat, such as fried foods. ? Food that is high in saturated fat, such as fatty meat. ? Sweets, desserts, sugary drinks, and other foods with added sugar. ? Full-fat dairy products.  Do not salt foods before eating.  Try to eat at least 2 vegetarian meals each week.  Eat more home-cooked food and less restaurant, buffet, and fast food.  When eating at a restaurant, ask that your food be prepared with less salt or no salt, if possible. What foods are recommended? The items listed may not be a complete list. Talk with your dietitian about what dietary choices are best for you. Grains Whole-grain or whole-wheat bread. Whole-grain or whole-wheat pasta. Brown rice. [place]. Bulgur. Whole-grain and low-sodium cereals. Pita bread. Low-fat, low-sodium crackers. Whole-wheat flour tortillas. Vegetables Fresh or frozen vegetables (raw, steamed, roasted, or grilled). Low-sodium or reduced-sodium tomato and vegetable juice. Low-sodium or reduced-sodium tomato sauce and tomato paste. Low-sodium or reduced-sodium canned vegetables. Fruits All fresh, dried, or frozen fruit. Canned fruit in natural juice (without added sugar). Meat and other protein foods Skinless chicken or [place]. Ground chicken or [place]. Pork with fat trimmed off. Fish and seafood. Egg whites. Dried beans, peas, or lentils. Unsalted nuts, nut butters, and seeds. Unsalted canned beans. Lean cuts of beef with fat trimmed off. Low-sodium, lean deli meat. Dairy Low-fat (1%) or fat-free (skim) milk. Fat-free, low-fat, or reduced-fat cheeses. Nonfat, low-sodium ricotta or cottage cheese. Low-fat or nonfat yogurt. Low-fat, low-sodium cheese. Fats and oils Soft margarine without trans fats. Vegetable oil. Low-fat, reduced-fat, or light mayonnaise and salad dressings (reduced-sodium). Canola, safflower, olive, soybean, and sunflower oils. Avocado. Seasoning and  other foods Herbs. Spices. Seasoning mixes without salt. Unsalted popcorn and pretzels. Fat-free sweets. What foods are not recommended? The items listed may not  be a complete list. Talk with your dietitian about what dietary choices are best for you. Grains Baked goods made with fat, such as croissants, muffins, or some breads. Dry pasta or rice meal packs. Vegetables Creamed or fried vegetables. Vegetables in a cheese sauce. Regular canned vegetables (not low-sodium or reduced-sodium). Regular canned tomato sauce and paste (not low-sodium or reduced-sodium). Regular tomato and vegetable juice (not low-sodium or reduced-sodium). Angie Fava. Olives. Fruits Canned fruit in a light or heavy syrup. Fried fruit. Fruit in cream or butter sauce. Meat and other protein foods Fatty cuts of meat. Ribs. Fried meat. Berniece Salines. Sausage. Bologna and other processed lunch meats. Salami. Fatback. Hotdogs. Bratwurst. Salted nuts and seeds. Canned beans with added salt. Canned or smoked fish. Whole eggs or egg yolks. Chicken or [place] with skin. Dairy Whole or 2% milk, cream, and half-and-half. Whole or full-fat cream cheese. Whole-fat or sweetened yogurt. Full-fat cheese. Nondairy creamers. Whipped toppings. Processed cheese and cheese spreads. Fats and oils Butter. Stick margarine. Lard. Shortening. Ghee. Bacon fat. Tropical oils, such as coconut, palm kernel, or palm oil. Seasoning and other foods Salted popcorn and pretzels. Onion salt, garlic salt, seasoned salt, table salt, and sea salt. Worcestershire sauce. Tartar sauce. Barbecue sauce. Teriyaki sauce. Soy sauce, including reduced-sodium. Steak sauce. Canned and packaged gravies. Fish sauce. Oyster sauce. Cocktail sauce. Horseradish that you find on the shelf. Ketchup. Mustard. Meat flavorings and tenderizers. Bouillon cubes. Hot sauce and Tabasco sauce. Premade or packaged marinades. Premade or packaged taco seasonings. Relishes. Regular salad dressings. Where to  find more information:  National Heart, Lung, and [place]: https://wilson-eaton.com/  American Heart Association: www.heart.org Summary  The DASH eating plan is a healthy eating plan that has been shown to reduce high blood pressure (hypertension). It may also reduce your risk for type 2 diabetes, heart disease, and stroke.  With the DASH eating plan, you should limit salt (sodium) intake to 2,300 mg a day. If you have hypertension, you may need to reduce your sodium intake to 1,500 mg a day.  When on the DASH eating plan, aim to eat more fresh fruits and vegetables, whole grains, lean proteins, low-fat dairy, and heart-healthy fats.  Work with your health care provider or diet and nutrition specialist (dietitian) to adjust your eating plan to your individual calorie needs. This information is not intended to replace advice given to you by your health care provider. Make sure you discuss any questions you have with your health care provider. Document Released: 04/10/2011 Document Revised: 04/14/2016 Document Reviewed: 04/14/2016 Elsevier Interactive Patient Education  2018 [employer]. Varenicline oral tablets What is this medicine? VARENICLINE (var EN i kleen) is used to help people quit smoking. It can reduce the symptoms caused by stopping smoking. It is used with a patient support program recommended by your physician. This medicine may be used for other purposes; ask your health care provider or pharmacist if you have questions. COMMON BRAND NAME(S): Chantix What should I tell my health care provider before I take this medicine? They need to know if you have any of these conditions: -bipolar disorder, depression, schizophrenia or other mental illness -heart disease -if you often drink alcohol -kidney disease -peripheral vascular disease -seizures -stroke -suicidal thoughts, plans, or attempt; a previous suicide attempt by you or a family member -an unusual or allergic reaction  to varenicline, other medicines, foods, dyes, or preservatives -pregnant or trying to get pregnant -breast-feeding How should I use this medicine? Take this medicine by mouth after  eating. Take with a full glass of water. Follow the directions on the prescription label. Take your doses at regular intervals. Do not take your medicine more often than directed. There are 3 ways you can use this medicine to help you quit smoking; talk to your health care professional to decide which plan is right for you: 1) you can choose a quit date and start this medicine 1 week before the quit date, or, 2) you can start taking this medicine before you choose a quit date, and then pick a quit date between day 8 and 35 days of treatment, or, 3) if you are not sure that you are able or willing to quit smoking right away, start taking this medicine and slowly decrease the amount you smoke as directed by your health care professional with the goal of being cigarette-free by week 12 of treatment. Stick to your plan; ask about support groups or other ways to help you remain cigarette-free. If you are motivated to quit smoking and did not succeed during a previous attempt with this medicine for reasons other than side effects, or if you returned to smoking after this treatment, speak with your health care professional about whether another course of this medicine may be right for you. A special MedGuide will be given to you by the pharmacist with each prescription and refill. Be sure to read this information carefully each time. Talk to your pediatrician regarding the use of this medicine in children. This medicine is not approved for use in children. Overdosage: If you think you have taken too much of this medicine contact a poison control center or emergency room at once. NOTE: This medicine is only for you. Do not share this medicine with others. What if I miss a dose? If you miss a dose, take it as soon as you can. If it  is almost time for your next dose, take only that dose. Do not take double or extra doses. What may interact with this medicine? -alcohol or any product that contains alcohol -insulin -other stop smoking aids -theophylline -warfarin This list may not describe all possible interactions. Give your health care provider a list of all the medicines, herbs, non-prescription drugs, or dietary supplements you use. Also tell them if you smoke, drink alcohol, or use illegal drugs. Some items may interact with your medicine. What should I watch for while using this medicine? Visit your doctor or health care professional for regular check ups. Ask for ongoing advice and encouragement from your doctor or healthcare professional, friends, and family to help you quit. If you smoke while on this medication, quit again Your mouth may get dry. Chewing sugarless gum or sucking hard candy, and drinking plenty of water may help. Contact your doctor if the problem does not go away or is severe. You may get drowsy or dizzy. Do not drive, use machinery, or do anything that needs mental alertness until you know how this medicine affects you. Do not stand or sit up quickly, especially if you are an older patient. This reduces the risk of dizzy or fainting spells. Sleepwalking can happen during treatment with this medicine, and can sometimes lead to behavior that is harmful to you, other people, or property. Stop taking this medicine and tell your doctor if you start sleepwalking or have other unusual sleep-related activity. Decrease the amount of alcoholic beverages that you drink during treatment with this medicine until you know if this medicine affects your ability to tolerate alcohol.  Some people have experienced increased drunkenness (intoxication), unusual or sometimes aggressive behavior, or no memory of things that have happened (amnesia) during treatment with this medicine. The use of this medicine may increase the  chance of suicidal thoughts or actions. Pay special attention to how you are responding while on this medicine. Any worsening of mood, or thoughts of suicide or dying should be reported to your health care professional right away. What side effects may I notice from receiving this medicine? Side effects that you should report to your doctor or health care professional as soon as possible: -allergic reactions like skin rash, itching or hives, swelling of the face, lips, tongue, or throat -acting aggressive, being angry or violent, or acting on dangerous impulses -breathing problems -changes in vision -chest pain or chest tightness -confusion, trouble speaking or understanding -new or worsening depression, anxiety, or panic attacks -extreme increase in activity and talking (mania) -fast, irregular heartbeat -feeling faint or lightheaded, falls -fever -pain in legs when walking -problems with balance, talking, walking -redness, blistering, peeling or loosening of the skin, including inside the mouth -ringing in ears -seeing or hearing things that aren't there (hallucinations) -seizures -sleepwalking -sudden numbness or weakness of the face, arm or leg -thoughts about suicide or dying, or attempts to commit suicide -trouble passing urine or change in the amount of urine -unusual bleeding or bruising -unusually weak or tired Side effects that usually do not require medical attention (report to your doctor or health care professional if they continue or are bothersome): -constipation -headache -nausea, vomiting -strange dreams -stomach gas -trouble sleeping This list may not describe all possible side effects. Call your doctor for medical advice about side effects. You may report side effects to FDA at 1-800-FDA-1088. Where should I keep my medicine? Keep out of the reach of children. Store at room temperature between 15 and 30 degrees C (59 and 86 degrees F). Throw away any unused  medicine after the expiration date. NOTE: This sheet is a summary. It may not cover all possible information. If you have questions about this medicine, talk to your doctor, pharmacist, or health care provider.  2018 [employer]vier/Gold Standard (2015-01-04 16:14:23)

## 2018-03-29 ENCOUNTER — Other Ambulatory Visit (INDEPENDENT_AMBULATORY_CARE_PROVIDER_SITE_OTHER): Payer: BLUE CROSS/BLUE SHIELD

## 2018-03-29 DIAGNOSIS — Z0001 Encounter for general adult medical examination with abnormal findings: Secondary | ICD-10-CM

## 2018-03-29 LAB — COMPREHENSIVE METABOLIC PANEL
ALBUMIN: 4 g/dL (ref 3.5–5.2)
ALT: 27 U/L (ref 0–53)
AST: 14 U/L (ref 0–37)
Alkaline Phosphatase: 98 U/L (ref 39–117)
BILIRUBIN TOTAL: 0.6 mg/dL (ref 0.2–1.2)
BUN: 19 mg/dL (ref 6–23)
CALCIUM: 9.3 mg/dL (ref 8.4–10.5)
CHLORIDE: 104 meq/L (ref 96–112)
CO2: 31 meq/L (ref 19–32)
Creatinine, Ser: 1.07 mg/dL (ref 0.40–1.50)
GFR: 73.62 mL/min (ref >60.00)
Glucose, Bld: 101 mg/dL — ABNORMAL HIGH (ref 70–99)
Potassium: 4.4 mEq/L (ref 3.5–5.1)
Sodium: 142 mEq/L (ref 135–145)
Total Protein: 6.3 g/dL (ref 6.0–8.3)

## 2018-03-29 LAB — CBC
HEMATOCRIT: 49.5 % (ref 39.0–52.0)
Hemoglobin: 16.6 g/dL (ref 13.0–17.0)
MCHC: 33.5 g/dL (ref 30.0–36.0)
MCV: 90.8 fl (ref 78.0–100.0)
PLATELETS: 152 10*3/uL (ref 150.0–400.0)
RBC: 5.46 Mil/uL (ref 4.22–5.81)
RDW: 14.3 % (ref 11.5–15.5)
WBC: 5.9 10*3/uL (ref 4.0–10.5)

## 2018-03-29 LAB — VITAMIN D 25 HYDROXY (VIT D DEFICIENCY, FRACTURES): VITD: 23.31 ng/mL — ABNORMAL LOW (ref 30.00–100.00)

## 2018-03-29 LAB — LIPID PANEL
CHOL/HDL RATIO: 6
CHOLESTEROL: 211 mg/dL — AB (ref 0–200)
HDL: 34.6 mg/dL — ABNORMAL LOW (ref >39.00)
NONHDL: 176.03
TRIGLYCERIDES: 211 mg/dL — AB (ref 0.0–149.0)
VLDL: 42.2 mg/dL — ABNORMAL HIGH (ref 0.0–40.0)

## 2018-03-29 LAB — PSA: PSA: 0.39 ng/mL (ref 0.10–4.00)

## 2018-03-29 LAB — TSH: TSH: 1.16 u[IU]/mL (ref 0.35–4.50)

## 2018-03-29 LAB — LDL CHOLESTEROL, DIRECT: Direct LDL: 159 mg/dL

## 2018-03-30 DIAGNOSIS — R0602 Shortness of breath: Secondary | ICD-10-CM

## 2018-03-30 DIAGNOSIS — Z720 Tobacco use: Secondary | ICD-10-CM | POA: Insufficient documentation

## 2018-03-30 DIAGNOSIS — R030 Elevated blood-pressure reading, without diagnosis of hypertension: Secondary | ICD-10-CM

## 2018-03-30 DIAGNOSIS — R9389 Abnormal findings on diagnostic imaging of other specified body structures: Secondary | ICD-10-CM | POA: Insufficient documentation

## 2018-03-30 DIAGNOSIS — Z23 Encounter for immunization: Secondary | ICD-10-CM

## 2018-03-30 HISTORY — DX: Abnormal findings on diagnostic imaging of other specified body structures: R93.89

## 2018-03-30 HISTORY — DX: Shortness of breath: R06.02

## 2018-03-30 HISTORY — DX: Encounter for immunization: Z23

## 2018-03-30 HISTORY — DX: Elevated blood-pressure reading, without diagnosis of hypertension: R03.0

## 2018-04-12 ENCOUNTER — Encounter: Payer: Self-pay | Admitting: Family Medicine

## 2018-04-12 ENCOUNTER — Ambulatory Visit: Payer: BLUE CROSS/BLUE SHIELD | Admitting: Family Medicine

## 2018-04-12 VITALS — BP 146/80 | HR 79 | Ht 64.0 in | Wt 200.4 lb

## 2018-04-12 DIAGNOSIS — E7800 Pure hypercholesterolemia, unspecified: Secondary | ICD-10-CM | POA: Insufficient documentation

## 2018-04-12 DIAGNOSIS — F419 Anxiety disorder, unspecified: Secondary | ICD-10-CM | POA: Insufficient documentation

## 2018-04-12 DIAGNOSIS — E559 Vitamin D deficiency, unspecified: Secondary | ICD-10-CM

## 2018-04-12 DIAGNOSIS — Z720 Tobacco use: Secondary | ICD-10-CM | POA: Diagnosis not present

## 2018-04-12 DIAGNOSIS — R030 Elevated blood-pressure reading, without diagnosis of hypertension: Secondary | ICD-10-CM

## 2018-04-12 DIAGNOSIS — R9389 Abnormal findings on diagnostic imaging of other specified body structures: Secondary | ICD-10-CM

## 2018-04-12 HISTORY — DX: Pure hypercholesterolemia, unspecified: E78.00

## 2018-04-12 HISTORY — DX: Vitamin D deficiency, unspecified: E55.9

## 2018-04-12 HISTORY — DX: Anxiety disorder, unspecified: F41.9

## 2018-04-12 MED ORDER — VITAMIN D (ERGOCALCIFEROL) 1.25 MG (50000 UNIT) PO CAPS: 50000.0000 [IU] | ORAL_CAPSULE | ORAL | 1 refills | Status: DC

## 2018-04-12 MED ORDER — ATORVASTATIN CALCIUM 20 MG PO TABS: 20.0000 mg | ORAL_TABLET | Freq: Every day | ORAL | 3 refills | Status: DC

## 2018-04-12 MED ORDER — ALPRAZOLAM 0.25 MG PO TABS: 0.2500 mg | ORAL_TABLET | Freq: Two times a day (BID) | ORAL | 0 refills | Status: DC | PRN

## 2018-04-12 NOTE — Progress Notes (Signed)
Established Patient Office Visit  Subjective:  Patient ID: Justin Boyd, male    DOB: 1951/02/02  Age: 67 y.o. MRN: [id number]  CC:  Chief Complaint  Patient presents with  . Follow-up    HPI Justin Boyd presents for follow up on his blood pressure.  Blood pressure remains elevated.  He has never been treated for hypertension before.  He has taken Lipitor successfully in the past for elevated cholesterol.  He has started the Chantix and has decreased his tobacco usage by 40%.  He does feel like he is having some withdrawal anxiety associated with quitting smoking.  Radiology did feel as though his chest x-ray deserved follow-up in 4 to 6 weeks.  With this patient's past medical history of extensive tobacco use we will go ahead and order a CT scan.  Past Medical History:  Diagnosis Date  . Chicken pox   . Hyperlipidemia     Past Surgical History:  Procedure Laterality Date  . carpal tunnel both hands  2010  . SHOULDER SURGERY Right 2016    History reviewed. No pertinent family history.  Social History   Socioeconomic History  . Marital status: Married    Spouse name: Not on file  . Number of children: Not on file  . Years of education: Not on file  . Highest education level: Not on file  Occupational History  . Not on file  Social Needs  . Financial resource strain: Not on file  . Food insecurity:    Worry: Not on file    Inability: Not on file  . Transportation needs:    Medical: Not on file    Non-medical: Not on file  Tobacco Use  . Smoking status: Current Every Day Smoker  . Smokeless tobacco: Never Used  Substance and Sexual Activity  . Alcohol use: Yes  . Drug use: Never  . Sexual activity: Yes    Partners: Female  Lifestyle  . Physical activity:    Days per week: Not on file    Minutes per session: Not on file  . Stress: Not on file  Relationships  . Social connections:    Talks on phone: Not on file    Gets together: Not on file    Attends  religious service: Not on file    Active member of club or organization: Not on file    Attends meetings of clubs or organizations: Not on file    Relationship status: Not on file  . Intimate partner violence:    Fear of current or ex partner: Not on file    Emotionally abused: Not on file    Physically abused: Not on file    Forced sexual activity: Not on file  Other Topics Concern  . Not on file  Social History Narrative  . Not on file    Outpatient Medications Prior to Visit  Medication Sig Dispense Refill  . pantoprazole (PROTONIX) 40 MG tablet Take 40 mg by mouth daily.    . varenicline (CHANTIX PAK) 0.5 MG X 11 & 1 MG X 42 tablet Take one 0.5 mg tablet by mouth once daily for 3 days, then increase to one 0.5 mg tablet twice daily for 4 days, then increase to one 1 mg tablet twice daily. 53 tablet 0   No facility-administered medications prior to visit.     Allergies  Allergen Reactions  . Oxycodone-Acetaminophen Other (See Comments)    Insomnia, so is able to take it Insomnia, so is  able to take it   . Hydrocodone-Acetaminophen Itching    insomnia insomnia   . Penicillins Swelling    Extreme soreness Extreme soreness     ROS Review of Systems  Constitutional: Negative for chills, diaphoresis, fatigue, fever and unexpected weight change.  HENT: Negative.   Eyes: Negative for photophobia.  Respiratory: Positive for cough and shortness of breath. Negative for wheezing.   Cardiovascular: Negative.   Gastrointestinal: Negative.   Endocrine: Negative for polyphagia and polyuria.  Genitourinary: Negative.   Musculoskeletal: Negative for joint swelling and myalgias.  Skin: Negative for pallor and rash.  Neurological: Negative for seizures, speech difficulty and numbness.  Hematological: Does not bruise/bleed easily.  Psychiatric/Behavioral: Negative.       Objective:    Physical Exam  Constitutional: He is oriented to person, place, and time. He appears  well-developed and well-nourished. No distress.  HENT:  Head: Normocephalic and atraumatic.  Right Ear: External ear normal.  Left Ear: External ear normal.  Eyes: Right eye exhibits no discharge. Left eye exhibits no discharge. No scleral icterus.  Neck: No JVD present. No tracheal deviation present.  Pulmonary/Chest: Effort normal.  Neurological: He is alert and oriented to person, place, and time.  Skin: Skin is warm and dry. He is not diaphoretic.  Psychiatric: He has a normal mood and affect. His behavior is normal.    BP (!) 146/80   Pulse 79   Ht 5\' 4"  (1.626 m)   Wt 200 lb 6 oz (90.9 kg)   SpO2 92%   BMI 34.39 kg/m  Wt Readings from Last 3 Encounters:  04/12/18 200 lb 6 oz (90.9 kg)  03/26/18 194 lb 6 oz (88.2 kg)   BP Readings from Last 3 Encounters:  04/12/18 (!) 146/80  03/26/18 (!) 160/82   Health Maintenance Due  Topic Date Due  . Hepatitis C Screening  1950/10/14  . HIV Screening  10/28/1965    There are no preventive care reminders to display for this patient.  Lab Results  Component Value Date   TSH 1.16 03/29/2018   Lab Results  Component Value Date   WBC 5.9 03/29/2018   HGB 16.6 03/29/2018   HCT 49.5 03/29/2018   MCV 90.8 03/29/2018   PLT 152.0 03/29/2018   Lab Results  Component Value Date   NA 142 03/29/2018   K 4.4 03/29/2018   CO2 31 03/29/2018   GLUCOSE 101 (H) 03/29/2018   BUN 19 03/29/2018   CREATININE 1.07 03/29/2018   BILITOT 0.6 03/29/2018   ALKPHOS 98 03/29/2018   AST 14 03/29/2018   ALT 27 03/29/2018   PROT 6.3 03/29/2018   ALBUMIN 4.0 03/29/2018   CALCIUM 9.3 03/29/2018   GFR 73.62 03/29/2018   Lab Results  Component Value Date   CHOL 211 (H) 03/29/2018   Lab Results  Component Value Date   HDL 34.60 (L) 03/29/2018   No results found for: [facility] Lab Results  Component Value Date   TRIG 211.0 (H) 03/29/2018   Lab Results  Component Value Date   CHOLHDL 6 03/29/2018   No results found for: HGBA1C      Assessment & Plan:   Problem List Items Addressed This Visit      Other   Elevated BP without diagnosis of hypertension   Tobacco abuse   Abnormal CXR - Primary   Relevant Orders   CT CHEST LUNG CA SCREEN LOW DOSE W/O CM   Elevated LDL cholesterol level   Relevant Medications  atorvastatin (LIPITOR) 20 MG tablet   Anxiety disorder   Relevant Medications   ALPRAZolam (XANAX) 0.25 MG tablet   Vitamin D deficiency   Relevant Medications   Vitamin D, Ergocalciferol, (DRISDOL) 1.25 MG (50000 UT) CAPS capsule      Meds ordered this encounter  Medications  . ALPRAZolam (XANAX) 0.25 MG tablet    Sig: Take 1 tablet (0.25 mg total) by mouth 2 (two) times daily as needed for anxiety.    Dispense:  40 tablet    Refill:  0  . atorvastatin (LIPITOR) 20 MG tablet    Sig: Take 1 tablet (20 mg total) by mouth daily.    Dispense:  90 tablet    Refill:  3  . Vitamin D, Ergocalciferol, (DRISDOL) 1.25 MG (50000 UT) CAPS capsule    Sig: Take 1 capsule (50,000 Units total) by mouth every 7 (seven) days.    Dispense:  30 capsule    Refill:  1    Follow-up: Return in about 5 weeks (around 05/17/2018).   Patient will continue decreasing his tobacco use is in a stepdown fashion.  Will use Xanax as needed.  We discussed not using this medication past its immediate need.  He will go ahead and start Lipitor.  He will follow-up in 5 weeks for for an office visit and a recheck of his blood pressure.  We will go ahead and start vitamin D replacement therapy.Marland Kitchen  He was given information on the DASH diet and quitting smoking.

## 2018-04-12 NOTE — Patient Instructions (Signed)
DASH Eating Plan DASH stands for "Dietary Approaches to Stop Hypertension." The DASH eating plan is a healthy eating plan that has been shown to reduce high blood pressure (hypertension). It may also reduce your risk for type 2 diabetes, heart disease, and stroke. The DASH eating plan may also help with weight loss. What are tips for following this plan? General guidelines  Avoid eating more than 2,300 mg (milligrams) of salt (sodium) a day. If you have hypertension, you may need to reduce your sodium intake to 1,500 mg a day.  Limit alcohol intake to no more than 1 drink a day for nonpregnant women and 2 drinks a day for men. One drink equals 12 oz of beer, 5 oz of wine, or 1 oz of hard liquor.  Work with your health care provider to maintain a healthy body weight or to lose weight. Ask what an ideal weight is for you.  Get at least 30 minutes of exercise that causes your heart to beat faster (aerobic exercise) most days of the week. Activities may include walking, swimming, or biking.  Work with your health care provider or diet and nutrition specialist (dietitian) to adjust your eating plan to your individual calorie needs. Reading food labels  Check food labels for the amount of sodium per serving. Choose foods with less than 5 percent of the Daily Value of sodium. Generally, foods with less than 300 mg of sodium per serving fit into this eating plan.  To find whole grains, look for the word "whole" as the first word in the ingredient list. Shopping  Buy products labeled as "low-sodium" or "no salt added."  Buy fresh foods. Avoid canned foods and premade or frozen meals. Cooking  Avoid adding salt when cooking. Use salt-free seasonings or herbs instead of table salt or sea salt. Check with your health care provider or pharmacist before using salt substitutes.  Do not fry foods. Cook foods using healthy methods such as baking, boiling, grilling, and broiling instead.  Cook with  heart-healthy oils, such as olive, canola, soybean, or sunflower oil. Meal planning   Eat a balanced diet that includes: ? 5 or more servings of fruits and vegetables each day. At each meal, try to fill half of your plate with fruits and vegetables. ? Up to 6-8 servings of whole grains each day. ? Less than 6 oz of lean meat, poultry, or fish each day. A 3-oz serving of meat is about the same size as a deck of cards. One egg equals 1 oz. ? 2 servings of low-fat dairy each day. ? A serving of nuts, seeds, or beans 5 times each week. ? Heart-healthy fats. Healthy fats called Omega-3 fatty acids are found in foods such as flaxseeds and coldwater fish, like sardines, salmon, and mackerel.  Limit how much you eat of the following: ? Canned or prepackaged foods. ? Food that is high in trans fat, such as fried foods. ? Food that is high in saturated fat, such as fatty meat. ? Sweets, desserts, sugary drinks, and other foods with added sugar. ? Full-fat dairy products.  Do not salt foods before eating.  Try to eat at least 2 vegetarian meals each week.  Eat more home-cooked food and less restaurant, buffet, and fast food.  When eating at a restaurant, ask that your food be prepared with less salt or no salt, if possible. What foods are recommended? The items listed may not be a complete list. Talk with your dietitian about what   dietary choices are best for you. Grains Whole-grain or whole-wheat bread. Whole-grain or whole-wheat pasta. Brown rice. Oatmeal. Quinoa. Bulgur. Whole-grain and low-sodium cereals. Pita bread. Low-fat, low-sodium crackers. Whole-wheat flour tortillas. Vegetables Fresh or frozen vegetables (raw, steamed, roasted, or grilled). Low-sodium or reduced-sodium tomato and vegetable juice. Low-sodium or reduced-sodium tomato sauce and tomato paste. Low-sodium or reduced-sodium canned vegetables. Fruits All fresh, dried, or frozen fruit. Canned fruit in natural juice (without  added sugar). Meat and other protein foods Skinless chicken or turkey. Ground chicken or turkey. Pork with fat trimmed off. Fish and seafood. Egg whites. Dried beans, peas, or lentils. Unsalted nuts, nut butters, and seeds. Unsalted canned beans. Lean cuts of beef with fat trimmed off. Low-sodium, lean deli meat. Dairy Low-fat (1%) or fat-free (skim) milk. Fat-free, low-fat, or reduced-fat cheeses. Nonfat, low-sodium ricotta or cottage cheese. Low-fat or nonfat yogurt. Low-fat, low-sodium cheese. Fats and oils Soft margarine without trans fats. Vegetable oil. Low-fat, reduced-fat, or light mayonnaise and salad dressings (reduced-sodium). Canola, safflower, olive, soybean, and sunflower oils. Avocado. Seasoning and other foods Herbs. Spices. Seasoning mixes without salt. Unsalted popcorn and pretzels. Fat-free sweets. What foods are not recommended? The items listed may not be a complete list. Talk with your dietitian about what dietary choices are best for you. Grains Baked goods made with fat, such as croissants, muffins, or some breads. Dry pasta or rice meal packs. Vegetables Creamed or fried vegetables. Vegetables in a cheese sauce. Regular canned vegetables (not low-sodium or reduced-sodium). Regular canned tomato sauce and paste (not low-sodium or reduced-sodium). Regular tomato and vegetable juice (not low-sodium or reduced-sodium). Pickles. Olives. Fruits Canned fruit in a light or heavy syrup. Fried fruit. Fruit in cream or butter sauce. Meat and other protein foods Fatty cuts of meat. Ribs. Fried meat. Bacon. Sausage. Bologna and other processed lunch meats. Salami. Fatback. Hotdogs. Bratwurst. Salted nuts and seeds. Canned beans with added salt. Canned or smoked fish. Whole eggs or egg yolks. Chicken or turkey with skin. Dairy Whole or 2% milk, cream, and half-and-half. Whole or full-fat cream cheese. Whole-fat or sweetened yogurt. Full-fat cheese. Nondairy creamers. Whipped toppings.  Processed cheese and cheese spreads. Fats and oils Butter. Stick margarine. Lard. Shortening. Ghee. Bacon fat. Tropical oils, such as coconut, palm kernel, or palm oil. Seasoning and other foods Salted popcorn and pretzels. Onion salt, garlic salt, seasoned salt, table salt, and sea salt. Worcestershire sauce. Tartar sauce. Barbecue sauce. Teriyaki sauce. Soy sauce, including reduced-sodium. Steak sauce. Canned and packaged gravies. Fish sauce. Oyster sauce. Cocktail sauce. Horseradish that you find on the shelf. Ketchup. Mustard. Meat flavorings and tenderizers. Bouillon cubes. Hot sauce and Tabasco sauce. Premade or packaged marinades. Premade or packaged taco seasonings. Relishes. Regular salad dressings. Where to find more information:  National Heart, Lung, and Blood Institute: www.nhlbi.nih.gov  American Heart Association: www.heart.org Summary  The DASH eating plan is a healthy eating plan that has been shown to reduce high blood pressure (hypertension). It may also reduce your risk for type 2 diabetes, heart disease, and stroke.  With the DASH eating plan, you should limit salt (sodium) intake to 2,300 mg a day. If you have hypertension, you may need to reduce your sodium intake to 1,500 mg a day.  When on the DASH eating plan, aim to eat more fresh fruits and vegetables, whole grains, lean proteins, low-fat dairy, and heart-healthy fats.  Work with your health care provider or diet and nutrition specialist (dietitian) to adjust your eating plan to your individual   calorie needs. This information is not intended to replace advice given to you by your health care provider. Make sure you discuss any questions you have with your health care provider. Document Released: 04/10/2011 Document Revised: 04/14/2016 Document Reviewed: 04/14/2016 Elsevier Interactive Patient Education  2018 [place].  Generalized Anxiety Disorder, Adult Generalized anxiety disorder (GAD) is a mental health  disorder. People with this condition constantly worry about everyday events. Unlike normal anxiety, worry related to GAD is not triggered by a specific event. These worries also do not fade or get better with time. GAD interferes with life functions, including relationships, work, and school. GAD can vary from mild to severe. People with severe GAD can have intense waves of anxiety with physical symptoms (panic attacks). What are the causes? The exact cause of GAD is not known. What increases the risk? This condition is more likely to develop in:  Women.  People who have a family history of anxiety disorders.  People who are very shy.  People who experience very stressful life events, such as the death of a loved one.  People who have a very stressful family environment.  What are the signs or symptoms? People with GAD often worry excessively about many things in their lives, such as their health and family. They may also be overly concerned about:  Doing well at work.  Being on time.  Natural disasters.  Friendships.  Physical symptoms of GAD include:  Fatigue.  Muscle tension or having muscle twitches.  Trembling or feeling shaky.  Being easily startled.  Feeling like your heart is pounding or racing.  Feeling out of breath or like you cannot take a deep breath.  Having trouble falling asleep or staying asleep.  Sweating.  Nausea, diarrhea, or irritable bowel syndrome (IBS).  Headaches.  Trouble concentrating or remembering facts.  Restlessness.  Irritability.  How is this diagnosed? Your health care provider can diagnose GAD based on your symptoms and medical history. You will also have a physical exam. The health care provider will ask specific questions about your symptoms, including how severe they are, when they started, and if they come and go. Your health care provider may ask you about your use of alcohol or drugs, including prescription medicines.  Your health care provider may refer you to a mental health specialist for further evaluation. Your health care provider will do a thorough examination and may perform additional tests to rule out other possible causes of your symptoms. To be diagnosed with GAD, a person must have anxiety that:  Is out of his or her control.  Affects several different aspects of his or her life, such as work and relationships.  Causes distress that makes him or her unable to take part in normal activities.  Includes at least three physical symptoms of GAD, such as restlessness, fatigue, trouble concentrating, irritability, muscle tension, or sleep problems.  Before your health care provider can confirm a diagnosis of GAD, these symptoms must be present more days than they are not, and they must last for six months or longer. How is this treated? The following therapies are usually used to treat GAD:  Medicine. Antidepressant medicine is usually prescribed for long-term daily control. Antianxiety medicines may be added in severe cases, especially when panic attacks occur.  Talk therapy (psychotherapy). Certain types of talk therapy can be helpful in treating GAD by providing support, education, and guidance. Options include: ? Cognitive behavioral therapy (CBT). People learn coping skills and techniques to ease  their anxiety. They learn to identify unrealistic or negative thoughts and behaviors and to replace them with positive ones. ? Acceptance and commitment therapy (ACT). This treatment teaches people how to be mindful as a way to cope with unwanted thoughts and feelings. ? Biofeedback. This process trains you to manage your body's response (physiological response) through breathing techniques and relaxation methods. You will work with a therapist while machines are used to monitor your physical symptoms.  Stress management techniques. These include yoga, meditation, and exercise.  A mental health  specialist can help determine which treatment is best for you. Some people see improvement with one type of therapy. However, other people require a combination of therapies. Follow these instructions at home:  Take over-the-counter and prescription medicines only as told by your health care provider.  Try to maintain a normal routine.  Try to anticipate stressful situations and allow extra time to manage them.  Practice any stress management or self-calming techniques as taught by your health care provider.  Do not punish yourself for setbacks or for not making progress.  Try to recognize your accomplishments, even if they are small.  Keep all follow-up visits as told by your health care provider. This is important. Contact a health care provider if:  Your symptoms do not get better.  Your symptoms get worse.  You have signs of depression, such as: ? A persistently sad, cranky, or irritable mood. ? Loss of enjoyment in activities that used to bring you joy. ? Change in weight or eating. ? Changes in sleeping habits. ? Avoiding friends or family members. ? Loss of energy for normal tasks. ? Feelings of guilt or worthlessness. Get help right away if:  You have serious thoughts about hurting yourself or others. If you ever feel like you may hurt yourself or others, or have thoughts about taking your own life, get help right away. You can go to your nearest emergency department or call:  Your local emergency services (911 in the [place]  A suicide crisis helpline, such as the [place] at 260-044-9187. This is open 24 hours a day.  Summary  Generalized anxiety disorder (GAD) is a mental health disorder that involves worry that is not triggered by a specific event.  People with GAD often worry excessively about many things in their lives, such as their health and family.  GAD may cause physical symptoms such as restlessness, trouble concentrating,  sleep problems, frequent sweating, nausea, diarrhea, headaches, and trembling or muscle twitching.  A mental health specialist can help determine which treatment is best for you. Some people see improvement with one type of therapy. However, other people require a combination of therapies. This information is not intended to replace advice given to you by your health care provider. Make sure you discuss any questions you have with your health care provider. Document Released: 08/16/2012 Document Revised: 03/11/2016 Document Reviewed: 03/11/2016 Elsevier Interactive Patient Education  2018 [place] with Quitting Smoking Quitting smoking is a physical and mental challenge. You will face cravings, withdrawal symptoms, and temptation. Before quitting, work with your health care provider to make a plan that can help you cope. Preparation can help you quit and keep you from giving in. How can I cope with cravings? Cravings usually last for 5-10 minutes. If you get through it, the craving will pass. Consider taking the following actions to help you cope with cravings:  Keep your mouth busy: ? Chew sugar-free gum. ? Suck  on hard candies or a straw. ? Brush your teeth.  Keep your hands and body busy: ? Immediately change to a different activity when you feel a craving. ? Squeeze or play with a ball. ? Do an activity or a hobby, like making bead jewelry, practicing needlepoint, or working with wood. ? Mix up your normal routine. ? Take a short exercise break. Go for a quick walk or run up and down stairs. ? Spend time in public places where smoking is not allowed.  Focus on doing something kind or helpful for someone else.  Call a friend or family member to talk during a craving.  Join a support group.  Call a quit line, such as 1-800-QUIT-NOW.  Talk with your health care provider about medicines that might help you cope with cravings and make quitting easier for you.  How can  I deal with withdrawal symptoms? Your body may experience negative effects as it tries to get used to not having nicotine in the system. These effects are called withdrawal symptoms. They may include:  Feeling hungrier than normal.  Trouble concentrating.  Irritability.  Trouble sleeping.  Feeling depressed.  Restlessness and agitation.  Craving a cigarette.  To manage withdrawal symptoms:  Avoid places, people, and activities that trigger your cravings.  Remember why you want to quit.  Get plenty of sleep.  Avoid coffee and other caffeinated drinks. These may worsen some of your symptoms.  How can I handle social situations? Social situations can be difficult when you are quitting smoking, especially in the first few weeks. To manage this, you can:  Avoid parties, bars, and other social situations where people might be smoking.  Avoid alcohol.  Leave right away if you have the urge to smoke.  Explain to your family and friends that you are quitting smoking. Ask for understanding and support.  Plan activities with friends or family where smoking is not an option.  What are some ways I can cope with stress? Wanting to smoke may cause stress, and stress can make you want to smoke. Find ways to manage your stress. Relaxation techniques can help. For example:  Breathe slowly and deeply, in through your nose and out through your mouth.  Listen to soothing, relaxing music.  Talk with a family member or friend about your stress.  Light a candle.  Soak in a bath or take a shower.  Think about a peaceful place.  What are some ways I can prevent weight gain? Be aware that many people gain weight after they quit smoking. However, not everyone does. To keep from gaining weight, have a plan in place before you quit and stick to the plan after you quit. Your plan should include:  Having healthy snacks. When you have a craving, it may help to: ? Eat plain popcorn, crunchy  carrots, celery, or other cut vegetables. ? Chew sugar-free gum.  Changing how you eat: ? Eat small portion sizes at meals. ? Eat 4-6 small meals throughout the day instead of 1-2 large meals a day. ? Be mindful when you eat. Do not watch television or do other things that might distract you as you eat.  Exercising regularly: ? Make time to exercise each day. If you do not have time for a long workout, do short bouts of exercise for 5-10 minutes several times a day. ? Do some form of strengthening exercise, like weight lifting, and some form of aerobic exercise, like running or swimming.  Drinking plenty  of water or other low-calorie or no-calorie drinks. Drink 6-8 glasses of water daily, or as much as instructed by your health care provider.  Summary  Quitting smoking is a physical and mental challenge. You will face cravings, withdrawal symptoms, and temptation to smoke again. Preparation can help you as you go through these challenges.  You can cope with cravings by keeping your mouth busy (such as by chewing gum), keeping your body and hands busy, and making calls to family, friends, or a helpline for people who want to quit smoking.  You can cope with withdrawal symptoms by avoiding places where people smoke, avoiding drinks with caffeine, and getting plenty of rest.  Ask your health care provider about the different ways to prevent weight gain, avoid stress, and handle social situations. This information is not intended to replace advice given to you by your health care provider. Make sure you discuss any questions you have with your health care provider. Document Released: 04/18/2016 Document Revised: 04/18/2016 Document Reviewed: 04/18/2016 Elsevier Interactive Patient Education  [employer].

## 2018-04-15 NOTE — Addendum Note (Signed)
Addended by: Kateri Mc E on: 04/15/2018 10:11 AM   Modules accepted: Orders

## 2018-04-19 ENCOUNTER — Other Ambulatory Visit: Payer: Self-pay | Admitting: Acute Care

## 2018-04-19 ENCOUNTER — Telehealth: Payer: Self-pay

## 2018-04-19 DIAGNOSIS — Z122 Encounter for screening for malignant neoplasm of respiratory organs: Secondary | ICD-10-CM

## 2018-04-19 DIAGNOSIS — F17210 Nicotine dependence, cigarettes, uncomplicated: Principal | ICD-10-CM

## 2018-04-19 NOTE — Telephone Encounter (Signed)
This is the patient I sent you a staff message about. The CT that was ordered can only be done [facility] for the lung cancer screening. You changed the referral to [facility].

## 2018-04-19 NOTE — Telephone Encounter (Signed)
I called and made patient aware that he will receive another phone call to schedule the screening. He verbalized understanding.

## 2018-04-19 NOTE — Telephone Encounter (Signed)
Copied from [place] [id number]. Topic: General - Inquiry >> Apr 19, 2018  8:38 AM Virl Axe D wrote: Reason for CRM: Pt called and stated Dr. Ethelene Hal was supposed to be scheduling a CT scan for him but he has not heard anything since last OV. Please advise. CB#[phone]

## 2018-04-26 ENCOUNTER — Other Ambulatory Visit: Payer: Self-pay | Admitting: Family Medicine

## 2018-04-26 DIAGNOSIS — Z720 Tobacco use: Secondary | ICD-10-CM

## 2018-04-26 MED ORDER — VARENICLINE TARTRATE 0.5 MG X 11 & 1 MG X 42 PO MISC: ORAL | 5 refills | Status: DC

## 2018-04-26 NOTE — Telephone Encounter (Signed)
Copied from [place] [id number]. Topic: Quick Communication - Rx Refill/Question >> Apr 26, 2018 12:20 PM Mcneil, Ja-Kwan wrote: Medication: varenicline (CHANTIX PAK) 0.5 MG X 11 & 1 MG X 42 tablet  Has the patient contacted their pharmacy? no  Preferred Pharmacy (with phone number or street name): [id number] - [place], [address] AT [address] [phone] (Phone) [phone] (Fax)  Agent: Please be advised that RX refills may take up to 3 business days. We ask that you follow-up with your pharmacy.

## 2018-05-11 NOTE — Progress Notes (Signed)
Shared Decision Making Visit Lung Cancer Screening Program [phone])   Eligibility:  Age 68 y.o.  Pack Years Smoking History Calculation 45 pack year smoking history (# packs/per year x # years smoked)  Recent History of coughing up blood  no  Unexplained weight loss? no ( >Than 15 pounds within the last 6 months )  Prior History Lung / other cancer no (Diagnosis within the last 5 years already requiring surveillance chest CT Scans).  Smoking Status Current Smoker  Former Smokers: Years since quit:NA  Quit Date: NA  Visit Components:  Discussion included one or more decision making aids. yes  Discussion included risk/benefits of screening. yes  Discussion included potential follow up diagnostic testing for abnormal scans. yes  Discussion included meaning and risk of over diagnosis. yes  Discussion included meaning and risk of False Positives. yes  Discussion included meaning of total radiation exposure. yes  Counseling Included:  Importance of adherence to annual lung cancer LDCT screening. yes  Impact of comorbidities on ability to participate in the program. yes  Ability and willingness to under diagnostic treatment. yes  Smoking Cessation Counseling:  Current Smokers:   Discussed importance of smoking cessation. yes  Information about tobacco cessation classes and interventions provided to patient. yes  Patient provided with "ticket" for LDCT Scan. yes  Symptomatic Patient. no  Counseling  Diagnosis Code: Tobacco Use Z72.0  Asymptomatic Patient yes  Counseling (Intermediate counseling: > three minutes counseling) G8366  Former Smokers:   Discussed the importance of maintaining cigarette abstinence. yes  Diagnosis Code: Personal History of Nicotine Dependence. Q94.765  Information about tobacco cessation classes and interventions provided to patient. Yes  Patient provided with "ticket" for LDCT Scan. yes  Written Order for Lung Cancer  Screening with LDCT placed in Epic. Yes (CT Chest Lung Cancer Screening Low Dose W/O CM) [id number] Z12.2-Screening of respiratory organs Z87.891-Personal history of nicotine dependence  I have spent 25 minutes of face to face time with Mr. Guiffre discussing the risks and benefits of lung cancer screening. We viewed a power point together that explained in detail the above noted topics. We paused at intervals to allow for questions to be asked and answered to ensure understanding.We discussed that the single most powerful action that he can take to decrease his risk of developing lung cancer is to quit smoking. We discussed whether or not he is ready to commit to setting a quit date.He had set a quit date of 05/05/2018 and he has been smoke free since that date. He is currently on his last pack of Chantrix.  I have also given him my card and contact information in the event he needs to contact me. We discussed the time and location of the scan, and that either Doroteo Glassman RN or I will call with the results within 24-48 hours of receiving them. I have offered him  a copy of the power point we viewed  as a resource in the event they need reinforcement of the concepts we discussed today in the office. The patient verbalized understanding of all of  the above and had no further questions upon leaving the office. They have my contact information in the event they have any further questions.  I spent 4 minutes counseling on smoking cessation and the health risks of continued tobacco abuse.  I explained to the patient that there has been a high incidence of coronary artery disease noted on these exams. I explained that this is a non-gated exam therefore  degree or severity cannot be determined. This patient is currently on statin therapy. I have asked the patient to follow-up with their PCP regarding any incidental finding of coronary artery disease and management with diet or medication as their PCP  feels is  clinically indicated. The patient verbalized understanding of the above and had no further questions upon completion of the visit.      Magdalen Spatz, NP 05/12/2018 12:29 PM

## 2018-05-12 ENCOUNTER — Ambulatory Visit
Admission: RE | Admit: 2018-05-12 | Discharge: 2018-05-12 | Disposition: A | Discharge status: Home / Self Care | Payer: BLUE CROSS/BLUE SHIELD | Source: Ambulatory Visit | Attending: Acute Care | Admitting: Acute Care

## 2018-05-12 ENCOUNTER — Encounter: Payer: Self-pay | Admitting: Acute Care

## 2018-05-12 ENCOUNTER — Ambulatory Visit (INDEPENDENT_AMBULATORY_CARE_PROVIDER_SITE_OTHER): Payer: BLUE CROSS/BLUE SHIELD | Admitting: Acute Care

## 2018-05-12 VITALS — BP 158/88 | HR 84 | Ht 64.0 in | Wt 201.0 lb

## 2018-05-12 DIAGNOSIS — F17210 Nicotine dependence, cigarettes, uncomplicated: Secondary | ICD-10-CM | POA: Diagnosis not present

## 2018-05-12 DIAGNOSIS — Z122 Encounter for screening for malignant neoplasm of respiratory organs: Secondary | ICD-10-CM

## 2018-05-12 DIAGNOSIS — Z87891 Personal history of nicotine dependence: Secondary | ICD-10-CM

## 2018-05-14 ENCOUNTER — Other Ambulatory Visit: Payer: Self-pay | Admitting: Acute Care

## 2018-05-14 DIAGNOSIS — Z122 Encounter for screening for malignant neoplasm of respiratory organs: Secondary | ICD-10-CM

## 2018-05-14 DIAGNOSIS — F17210 Nicotine dependence, cigarettes, uncomplicated: Principal | ICD-10-CM

## 2018-05-14 DIAGNOSIS — Z87891 Personal history of nicotine dependence: Secondary | ICD-10-CM

## 2018-05-17 ENCOUNTER — Encounter: Payer: Self-pay | Admitting: Family Medicine

## 2018-05-17 ENCOUNTER — Ambulatory Visit: Payer: BLUE CROSS/BLUE SHIELD | Admitting: Family Medicine

## 2018-05-17 VITALS — BP 124/70 | HR 80 | Ht 64.0 in | Wt 201.1 lb

## 2018-05-17 DIAGNOSIS — E7800 Pure hypercholesterolemia, unspecified: Secondary | ICD-10-CM | POA: Diagnosis not present

## 2018-05-17 DIAGNOSIS — R030 Elevated blood-pressure reading, without diagnosis of hypertension: Secondary | ICD-10-CM | POA: Diagnosis not present

## 2018-05-17 DIAGNOSIS — R0602 Shortness of breath: Secondary | ICD-10-CM

## 2018-05-17 DIAGNOSIS — E559 Vitamin D deficiency, unspecified: Secondary | ICD-10-CM | POA: Diagnosis not present

## 2018-05-17 NOTE — Patient Instructions (Signed)

## 2018-05-17 NOTE — Progress Notes (Signed)
Established Patient Office Visit  Subjective:  Patient ID: Justin Boyd, male    DOB: 1950/11/01  Age: 68 y.o. MRN: [id number]  CC:  Chief Complaint  Patient presents with  . Follow-up    HPI Justin Boyd presents for follow-up of his blood pressure, vitamin D deficiency and elevated LDL cholesterol.  He is taking the high-dose vitamin D weekly without issue.  He is back on atorvastatin and having no problems with it.  He did have his low-dose CT scan of his chest and just received a call that it was toward totally normal.  There was no masses or COPD.  He did quit smoking at the first of the year.  He continues to experience shortness of breath walking up an incline or stairs.  There is no chest pain nausea or diaphoresis.  He tells me that his exercise is limited due to past history of acute right shoulder injury.  Past Medical History:  Diagnosis Date  . Chicken pox   . Hyperlipidemia     Past Surgical History:  Procedure Laterality Date  . carpal tunnel both hands  2010  . SHOULDER SURGERY Right 2016    History reviewed. No pertinent family history.  Social History   Socioeconomic History  . Marital status: Married    Spouse name: Not on file  . Number of children: Not on file  . Years of education: Not on file  . Highest education level: Not on file  Occupational History  . Not on file  Social Needs  . Financial resource strain: Not on file  . Food insecurity:    Worry: Not on file    Inability: Not on file  . Transportation needs:    Medical: Not on file    Non-medical: Not on file  Tobacco Use  . Smoking status: Former Smoker    Packs/day: 1.00    Years: 45.00    Pack years: 45.00    Types: Cigarettes    Last attempt to quit: 05/05/2018    Years since quitting: 0.0  . Smokeless tobacco: Never Used  Substance and Sexual Activity  . Alcohol use: Yes  . Drug use: Never  . Sexual activity: Yes    Partners: Female  Lifestyle  . Physical activity:   Days per week: Not on file    Minutes per session: Not on file  . Stress: Not on file  Relationships  . Social connections:    Talks on phone: Not on file    Gets together: Not on file    Attends religious service: Not on file    Active member of club or organization: Not on file    Attends meetings of clubs or organizations: Not on file    Relationship status: Not on file  . Intimate partner violence:    Fear of current or ex partner: Not on file    Emotionally abused: Not on file    Physically abused: Not on file    Forced sexual activity: Not on file  Other Topics Concern  . Not on file  Social History Narrative  . Not on file    Outpatient Medications Prior to Visit  Medication Sig Dispense Refill  . ALPRAZolam (XANAX) 0.25 MG tablet Take 1 tablet (0.25 mg total) by mouth 2 (two) times daily as needed for anxiety. 40 tablet 0  . atorvastatin (LIPITOR) 20 MG tablet Take 1 tablet (20 mg total) by mouth daily. 90 tablet 3  . pantoprazole (PROTONIX) 40  MG tablet Take 40 mg by mouth daily.    . varenicline (CHANTIX PAK) 0.5 MG X 11 & 1 MG X 42 tablet Take one 0.5 mg tablet by mouth once daily for 3 days, then increase to one 0.5 mg tablet twice daily for 4 days, then increase to one 1 mg tablet twice daily. 53 tablet 5  . Vitamin D, Ergocalciferol, (DRISDOL) 1.25 MG (50000 UT) CAPS capsule Take 1 capsule (50,000 Units total) by mouth every 7 (seven) days. 30 capsule 1   No facility-administered medications prior to visit.     Allergies  Allergen Reactions  . Oxycodone-Acetaminophen Other (See Comments)    Insomnia, so is able to take it Insomnia, so is able to take it   . Hydrocodone-Acetaminophen Itching    insomnia insomnia   . Penicillins Swelling    Extreme soreness Extreme soreness     ROS Review of Systems  Constitutional: Negative.   Respiratory: Positive for shortness of breath. Negative for chest tightness and wheezing.   Cardiovascular: Negative for chest  pain and palpitations.  Gastrointestinal: Negative.   Endocrine: Negative for polyphagia and polyuria.  Musculoskeletal: Positive for arthralgias.  Allergic/Immunologic: Negative for immunocompromised state.  Neurological: Negative for headaches.  Hematological: Does not bruise/bleed easily.  Psychiatric/Behavioral: Negative.    The 10-year ASCVD risk score Mikey Bussing DC Brooke Bonito., et al., 2013) is: 21%   Values used to calculate the score:     Age: 36 years     Sex: Male     Is Non-Hispanic African American: No     Diabetic: No     Tobacco smoker: Yes     Systolic Blood Pressure: 474 mmHg     Is BP treated: No     HDL Cholesterol: 34.6 mg/dL     Total Cholesterol: 211 mg/dL   Objective:    Physical Exam  Constitutional: He is oriented to person, place, and time. He appears well-developed and well-nourished. No distress.  HENT:  Head: Normocephalic and atraumatic.  Right Ear: External ear normal.  Left Ear: External ear normal.  Eyes: Right eye exhibits no discharge. Left eye exhibits no discharge. No scleral icterus.  Neck: No JVD present. No tracheal deviation present.  Pulmonary/Chest: Effort normal. No stridor.  Neurological: He is alert and oriented to person, place, and time.  Skin: Skin is warm and dry. He is not diaphoretic.  Psychiatric: He has a normal mood and affect. His behavior is normal.    BP 124/70   Pulse 80   Ht 5\' 4"  (1.626 m)   Wt 201 lb 2 oz (91.2 kg)   SpO2 92%   BMI 34.52 kg/m  Wt Readings from Last 3 Encounters:  05/17/18 201 lb 2 oz (91.2 kg)  05/12/18 201 lb (91.2 kg)  04/12/18 200 lb 6 oz (90.9 kg)   BP Readings from Last 3 Encounters:  05/17/18 124/70  05/12/18 (!) 158/88  04/12/18 (!) 146/80   Guideline developer:  UpToDate (see UpToDate for funding source) Date Released: June 2014  Health Maintenance Due  Topic Date Due  . Hepatitis C Screening  Jul 03, 1950  . HIV Screening  10/28/1967    There are no preventive care reminders to  display for this patient.  Lab Results  Component Value Date   TSH 1.16 03/29/2018   Lab Results  Component Value Date   WBC 5.9 03/29/2018   HGB 16.6 03/29/2018   HCT 49.5 03/29/2018   MCV 90.8 03/29/2018   PLT 152.0 03/29/2018  Lab Results  Component Value Date   NA 142 03/29/2018   K 4.4 03/29/2018   CO2 31 03/29/2018   GLUCOSE 101 (H) 03/29/2018   BUN 19 03/29/2018   CREATININE 1.07 03/29/2018   BILITOT 0.6 03/29/2018   ALKPHOS 98 03/29/2018   AST 14 03/29/2018   ALT 27 03/29/2018   PROT 6.3 03/29/2018   ALBUMIN 4.0 03/29/2018   CALCIUM 9.3 03/29/2018   GFR 73.62 03/29/2018   Lab Results  Component Value Date   CHOL 211 (H) 03/29/2018   Lab Results  Component Value Date   HDL 34.60 (L) 03/29/2018   No results found for: [facility] Lab Results  Component Value Date   TRIG 211.0 (H) 03/29/2018   Lab Results  Component Value Date   CHOLHDL 6 03/29/2018   No results found for: HGBA1C    Assessment & Plan:   Problem List Items Addressed This Visit      Other   SOB (shortness of breath) on exertion - Primary   Relevant Orders   Ambulatory referral to Cardiology   Elevated BP without diagnosis of hypertension   Elevated LDL cholesterol level   Vitamin D deficiency      No orders of the defined types were placed in this encounter.   Follow-up: Return in about 1 month (around 06/17/2018).  Believe that the patient's dyspnea is more likely due to deconditioning.  Reminded him that he could still ride an exercise bike with his history of shoulder injury.  He is actually signed up for a walking program with his wife.  They will walk 5000 steps daily with a goal to reach 10,000 steps daily.  We will send for a stress test to rule out silent ischemia.  He does have risk factors for heart disease.

## 2018-05-25 ENCOUNTER — Ambulatory Visit (INDEPENDENT_AMBULATORY_CARE_PROVIDER_SITE_OTHER): Payer: BLUE CROSS/BLUE SHIELD | Admitting: Cardiology

## 2018-05-25 ENCOUNTER — Other Ambulatory Visit: Payer: Self-pay | Admitting: Cardiology

## 2018-05-25 ENCOUNTER — Encounter: Payer: Self-pay | Admitting: Cardiology

## 2018-05-25 VITALS — BP 130/72 | HR 73 | Ht 64.0 in | Wt 202.0 lb

## 2018-05-25 DIAGNOSIS — Z87891 Personal history of nicotine dependence: Secondary | ICD-10-CM

## 2018-05-25 DIAGNOSIS — Z0001 Encounter for general adult medical examination with abnormal findings: Secondary | ICD-10-CM

## 2018-05-25 DIAGNOSIS — R0602 Shortness of breath: Secondary | ICD-10-CM | POA: Diagnosis not present

## 2018-05-25 DIAGNOSIS — I2510 Atherosclerotic heart disease of native coronary artery without angina pectoris: Secondary | ICD-10-CM

## 2018-05-25 DIAGNOSIS — Z720 Tobacco use: Secondary | ICD-10-CM | POA: Diagnosis not present

## 2018-05-25 DIAGNOSIS — E7800 Pure hypercholesterolemia, unspecified: Secondary | ICD-10-CM

## 2018-05-25 DIAGNOSIS — Z122 Encounter for screening for malignant neoplasm of respiratory organs: Secondary | ICD-10-CM

## 2018-05-25 DIAGNOSIS — F17210 Nicotine dependence, cigarettes, uncomplicated: Secondary | ICD-10-CM

## 2018-05-25 HISTORY — DX: Atherosclerotic heart disease of native coronary artery without angina pectoris: I25.10

## 2018-05-25 NOTE — Patient Instructions (Signed)
Medication Instructions:   Your physician recommends that you continue on your current medications as directed. Please refer to the Current Medication list given to you today.  If you need a refill on your cardiac medications before your next appointment, please call your pharmacy.   Lab work:  NONE  If you have labs (blood work) drawn today and your tests are completely normal, you will receive your results only by: Marland Kitchen MyChart Message (if you have MyChart) OR . A paper copy in the mail If you have any lab test that is abnormal or we need to change your treatment, we will call you to review the results.  Testing/Procedures:  Your physician has requested that you have an echocardiogram. Echocardiography is a painless test that uses sound waves to create images of your heart. It provides your doctor with information about the size and shape of your heart and how well your heart's chambers and valves are working. This procedure takes approximately one hour. There are no restrictions for this procedure.  Your physician has requested that you have en exercise stress myoview. For further information please visit [email or web address]. Please follow instruction sheet, as given.    Follow-Up: At [facility], you and your health needs are our priority.  As part of our continuing mission to provide you with exceptional heart care, we have created designated Provider Care Teams.  These Care Teams include your primary Cardiologist (physician) and Advanced Practice Providers (APPs -  Physician Assistants and Nurse Practitioners) who all work together to provide you with the care you need, when you need it.  You will need a follow up appointment in 6 months.  Please call our office 2 months in advance to schedule this appointment.

## 2018-05-25 NOTE — Progress Notes (Signed)
Cardiology Office Note:    Date:  05/25/2018   ID:  Justin Boyd, DOB 1951-01-09, MRN [id number]  PCP:  [name], MD  Cardiologist:  Jenean Lindau, MD   Referring MD: [name],*    ASSESSMENT:    1. SOB (shortness of breath) on exertion   2. Encounter for health maintenance examination with abnormal findings   3. Tobacco abuse   4. Elevated LDL cholesterol level   5. Atherosclerosis of native coronary artery of native heart, angina presence unspecified    PLAN:    In order of problems listed above:  1. Secondary prevention stressed with the patient.  Importance of compliance with diet and medication stressed and he vocalized understanding.  His blood pressure is stable. 2. I discussed with him the findings of the coronary calcifications.  Shortness of breath is concerning and we will do an exercise stress Cardiolite. 3. Echocardiogram will be done to assess murmur heard on auscultation. 4. Diet was discussed for dyslipidemia and obesity.  Risks of obesity explained and he vocalized understanding.  I counseled him extensively never to go back to smoking and congratulated him on quitting smoking.  Risks of smoking explained he vocalized understanding. 5. Patient will be seen in follow-up appointment in 6 months or earlier if the patient has any concerns 6.    Medication Adjustments/Labs and Tests Ordered: Current medicines are reviewed at length with the patient today.  Concerns regarding medicines are outlined above.  No orders of the defined types were placed in this encounter.  No orders of the defined types were placed in this encounter.    History of Present Illness:    Justin Boyd is a 68 y.o. male who is being seen today for the evaluation of dyspnea on exertion and atherosclerotic coronary disease at the request of [name],*.  Patient is a pleasant 68 year old male.  He has past medical history of dyslipidemia.  He has  been a active smoker and this he has been doing since a very young age.  Is me that he quit a few days ago.  No orthopnea or PND.  Shortness of breath on exertion and this is getting progressively worse.  No chest tightness.  At the time of my evaluation, the patient is alert awake oriented and in no distress.  I asked him with a sexual activity brings around any chest tightness answered in the negative.  Overall he leads a sedentary lifestyle.  Recently has been initiated on lipid-lowering medications.  Past Medical History:  Diagnosis Date  . Chicken pox   . Hyperlipidemia     Past Surgical History:  Procedure Laterality Date  . carpal tunnel both hands  2010  . SHOULDER SURGERY Right 2016    Current Medications: Current Meds  Medication Sig  . atorvastatin (LIPITOR) 20 MG tablet Take 1 tablet (20 mg total) by mouth daily.  . pantoprazole (PROTONIX) 40 MG tablet Take 40 mg by mouth daily.  . varenicline (CHANTIX PAK) 0.5 MG X 11 & 1 MG X 42 tablet Take one 0.5 mg tablet by mouth once daily for 3 days, then increase to one 0.5 mg tablet twice daily for 4 days, then increase to one 1 mg tablet twice daily.  . Vitamin D, Ergocalciferol, (DRISDOL) 1.25 MG (50000 UT) CAPS capsule Take 1 capsule (50,000 Units total) by mouth every 7 (seven) days.     Allergies:   Oxycodone-acetaminophen; Hydrocodone-acetaminophen; and Penicillins   Social History  Socioeconomic History  . Marital status: Married    Spouse name: Not on file  . Number of children: Not on file  . Years of education: Not on file  . Highest education level: Not on file  Occupational History  . Not on file  Social Needs  . Financial resource strain: Not on file  . Food insecurity:    Worry: Not on file    Inability: Not on file  . Transportation needs:    Medical: Not on file    Non-medical: Not on file  Tobacco Use  . Smoking status: Former Smoker    Packs/day: 1.00    Years: 45.00    Pack years: 45.00     Types: Cigarettes    Last attempt to quit: 05/05/2018    Years since quitting: 0.0  . Smokeless tobacco: Never Used  Substance and Sexual Activity  . Alcohol use: Yes  . Drug use: Never  . Sexual activity: Yes    Partners: Female  Lifestyle  . Physical activity:    Days per week: Not on file    Minutes per session: Not on file  . Stress: Not on file  Relationships  . Social connections:    Talks on phone: Not on file    Gets together: Not on file    Attends religious service: Not on file    Active member of club or organization: Not on file    Attends meetings of clubs or organizations: Not on file    Relationship status: Not on file  Other Topics Concern  . Not on file  Social History Narrative  . Not on file     Family History: The patient's family history is not on file.  ROS:   Please see the history of present illness.    All other systems reviewed and are negative.  EKGs/Labs/Other Studies Reviewed:    The following studies were reviewed today: EKG reveals sinus rhythm and nonspecific ST-T changes.   Recent Labs: 03/29/2018: ALT 27; BUN 19; Creatinine, Ser 1.07; Hemoglobin 16.6; Platelets 152.0; Potassium 4.4; Sodium 142; TSH 1.16  Recent Lipid Panel    Component Value Date/Time   CHOL 211 (H) 03/29/2018 0803   TRIG 211.0 (H) 03/29/2018 0803   HDL 34.60 (L) 03/29/2018 0803   CHOLHDL 6 03/29/2018 0803   VLDL 42.2 (H) 03/29/2018 0803   LDLDIRECT 159.0 03/29/2018 0803    Physical Exam:    VS:  BP 130/72 (BP Location: Right Arm, Patient Position: Sitting, Cuff Size: Normal)   Pulse 73   Ht 5\' 4"  (1.626 m)   Wt 202 lb (91.6 kg)   SpO2 93%   BMI 34.67 kg/m     Wt Readings from Last 3 Encounters:  05/25/18 202 lb (91.6 kg)  05/17/18 201 lb 2 oz (91.2 kg)  05/12/18 201 lb (91.2 kg)     GEN: Patient is in no acute distress HEENT: Normal NECK: No JVD; No carotid bruits LYMPHATICS: No lymphadenopathy CARDIAC: S1 S2 regular, 2/6 systolic murmur at the  apex. RESPIRATORY:  Clear to auscultation without rales, wheezing or rhonchi  ABDOMEN: Soft, non-tender, non-distended MUSCULOSKELETAL:  No edema; No deformity  SKIN: Warm and dry NEUROLOGIC:  Alert and oriented x 3 PSYCHIATRIC:  Normal affect    Signed, Jenean Lindau, MD  05/25/2018 8:39 AM    [facility]

## 2018-05-27 ENCOUNTER — Ambulatory Visit (INDEPENDENT_AMBULATORY_CARE_PROVIDER_SITE_OTHER): Payer: BLUE CROSS/BLUE SHIELD

## 2018-05-27 ENCOUNTER — Telehealth: Payer: Self-pay

## 2018-05-27 DIAGNOSIS — I2510 Atherosclerotic heart disease of native coronary artery without angina pectoris: Secondary | ICD-10-CM

## 2018-05-27 DIAGNOSIS — R0602 Shortness of breath: Secondary | ICD-10-CM

## 2018-05-27 NOTE — Telephone Encounter (Signed)
Patient called and notified of test results. 

## 2018-05-27 NOTE — Telephone Encounter (Signed)
-----   Message from Jenean Lindau, MD sent at 05/27/2018  1:31 PM EST ----- The results of the study is unremarkable. Please inform patient. I will discuss in detail at next appointment. Cc  primary care/referring physician Jenean Lindau, MD 05/27/2018 1:31 PM

## 2018-05-27 NOTE — Progress Notes (Signed)
Complete echocardiogram has been performed.  Jimmy Faren Florence, RDCS, RVT 

## 2018-06-05 HISTORY — PX: HERNIA REPAIR: SHX51

## 2018-06-09 ENCOUNTER — Ambulatory Visit: Payer: BLUE CROSS/BLUE SHIELD | Admitting: Pulmonary Disease

## 2018-06-09 ENCOUNTER — Encounter: Payer: Self-pay | Admitting: Pulmonary Disease

## 2018-06-09 VITALS — BP 158/90 | HR 76 | Ht 64.0 in | Wt 208.0 lb

## 2018-06-09 DIAGNOSIS — R0602 Shortness of breath: Secondary | ICD-10-CM

## 2018-06-09 DIAGNOSIS — R9389 Abnormal findings on diagnostic imaging of other specified body structures: Secondary | ICD-10-CM

## 2018-06-09 MED ORDER — FLUTICASONE-UMECLIDIN-VILANT 100-62.5-25 MCG/INH IN AEPB: 1.0000 | INHALATION_SPRAY | Freq: Every day | RESPIRATORY_TRACT | 3 refills | Status: DC

## 2018-06-09 MED ORDER — FLUTICASONE-UMECLIDIN-VILANT 100-62.5-25 MCG/INH IN AEPB: 1.0000 | INHALATION_SPRAY | Freq: Every day | RESPIRATORY_TRACT | 0 refills | Status: DC

## 2018-06-09 NOTE — Addendum Note (Signed)
Addended by: Madolyn Frieze on: 06/09/2018 01:49 PM   Modules accepted: Orders

## 2018-06-09 NOTE — Patient Instructions (Addendum)
Shortness of breath Emphysema on CT scan of the chest  We will start you on an inhaler to be used daily-we will start you on Trelegy  I will see you back in the office in about 3 months We will get a breathing study on you at next visit  Call with any significant concerns

## 2018-06-09 NOTE — Progress Notes (Signed)
Justin Boyd    [id number]    06-Sep-1950  Primary Care Physician:Kremer, Mortimer Fries, MD  Referring Physician: [name], MD [place], [place] [address]  Chief complaint:   Shortness of breath with exertion  HPI:  Reformed smoker, recently quit in January Shortness of breath with exertion Since quitting smoking he has gained about 20 pounds Denies any chest pains or chest discomfort No significant cough or expectoration  He is part of the lung cancer screening program where he had a CT-CT was reviewed with patient showing evidence of emphysema  History of hypercholesterolemia  Exercise tolerance is not really significantly limited He states he can walk a couple of miles if he chooses to, was able to go further prior to recent shortness of breath  Does not feel acutely ill at present  Office work  Outpatient Encounter Medications as of 06/09/2018  Medication Sig  . atorvastatin (LIPITOR) 20 MG tablet Take 1 tablet (20 mg total) by mouth daily.  . magnesium citrate SOLN Take 1 Bottle by mouth once.  . pantoprazole (PROTONIX) 40 MG tablet Take 40 mg by mouth daily.  . Vitamin D, Ergocalciferol, (DRISDOL) 1.25 MG (50000 UT) CAPS capsule Take 1 capsule (50,000 Units total) by mouth every 7 (seven) days.  . [DISCONTINUED] varenicline (CHANTIX PAK) 0.5 MG X 11 & 1 MG X 42 tablet Take one 0.5 mg tablet by mouth once daily for 3 days, then increase to one 0.5 mg tablet twice daily for 4 days, then increase to one 1 mg tablet twice daily.   No facility-administered encounter medications on file as of 06/09/2018.     Allergies as of 06/09/2018 - Review Complete 06/09/2018  Allergen Reaction Noted  . Oxycodone-acetaminophen Other (See Comments) 12/16/2010  . Hydrocodone-acetaminophen Itching 12/16/2010  . Penicillins Swelling 12/16/2010    Past Medical History:  Diagnosis Date  . Chicken pox   . Hyperlipidemia     Past Surgical  History:  Procedure Laterality Date  . carpal tunnel both hands  2010  . SHOULDER SURGERY Right 2016    Family History  Problem Relation Age of Onset  . Hypertension Mother   . Stroke Mother     Social History   Socioeconomic History  . Marital status: Married    Spouse name: Not on file  . Number of children: Not on file  . Years of education: Not on file  . Highest education level: Not on file  Occupational History  . Not on file  Social Needs  . Financial resource strain: Not on file  . Food insecurity:    Worry: Not on file    Inability: Not on file  . Transportation needs:    Medical: Not on file    Non-medical: Not on file  Tobacco Use  . Smoking status: Former Smoker    Packs/day: 1.00    Years: 45.00    Pack years: 45.00    Types: Cigarettes    Last attempt to quit: 05/05/2018    Years since quitting: 0.0  . Smokeless tobacco: Never Used  Substance and Sexual Activity  . Alcohol use: Yes  . Drug use: Never  . Sexual activity: Yes    Partners: Female  Lifestyle  . Physical activity:    Days per week: Not on file    Minutes per session: Not on file  . Stress: Not on file  Relationships  . Social connections:  Talks on phone: Not on file    Gets together: Not on file    Attends religious service: Not on file    Active member of club or organization: Not on file    Attends meetings of clubs or organizations: Not on file    Relationship status: Not on file  . Intimate partner violence:    Fear of current or ex partner: Not on file    Emotionally abused: Not on file    Physically abused: Not on file    Forced sexual activity: Not on file  Other Topics Concern  . Not on file  Social History Narrative  . Not on file    Review of Systems  Constitutional: Negative.   HENT: Negative.   Eyes: Negative.   Respiratory: Positive for shortness of breath. Negative for cough.   Cardiovascular: Negative for chest pain, palpitations and leg swelling.    Gastrointestinal: Negative.     Vitals:   06/09/18 1022  BP: (!) 158/90  Pulse: 76  SpO2: 94%     Physical Exam  Constitutional: He appears well-developed and well-nourished.  HENT:  Head: Normocephalic and atraumatic.  Eyes: Pupils are equal, round, and reactive to light. Conjunctivae are normal. Right eye exhibits no discharge. Left eye exhibits no discharge.  Neck: Normal range of motion. Neck supple. No tracheal deviation present. No thyromegaly present.  Cardiovascular: Normal rate, regular rhythm and normal heart sounds.  Pulmonary/Chest: Effort normal and breath sounds normal. No respiratory distress. He has no wheezes. He has no rales. He exhibits no tenderness.  Abdominal: Soft. Bowel sounds are normal. He exhibits no distension. There is no abdominal tenderness. There is no rebound.   Data Reviewed: CT scan of the chest reviewed with patient-evidence of emphysema  Assessment:  Chronic obstructive pulmonary disease -Emphysema noted on CT scan of the chest Shortness of breath -Does not limit his activities of daily living    Plan/Recommendations:  We will obtain a pulmonary function study  We will start him on an inhaler Benefits of using an inhaler was discussed with the patient -Trelegy  I will see him back in the office in about 3 months Encouraged to call with any significant concerns   Sherrilyn Rist MD [facility] 06/09/2018, 10:41 AM  CC: [name],*

## 2018-06-09 NOTE — Addendum Note (Signed)
Addended by: Madolyn Frieze on: 06/09/2018 10:52 AM   Modules accepted: Orders

## 2018-06-09 NOTE — Addendum Note (Signed)
Addended by: Madolyn Frieze on: 06/09/2018 12:05 PM   Modules accepted: Orders

## 2018-06-10 ENCOUNTER — Ambulatory Visit (INDEPENDENT_AMBULATORY_CARE_PROVIDER_SITE_OTHER): Payer: BLUE CROSS/BLUE SHIELD | Admitting: Pulmonary Disease

## 2018-06-10 DIAGNOSIS — R0602 Shortness of breath: Secondary | ICD-10-CM | POA: Diagnosis not present

## 2018-06-10 DIAGNOSIS — R9389 Abnormal findings on diagnostic imaging of other specified body structures: Secondary | ICD-10-CM

## 2018-06-10 LAB — PULMONARY FUNCTION TEST
DL/VA % pred: 78 %
DL/VA: 3.32 ml/min/mmHg/L
DLCO unc % pred: 76 %
DLCO unc: 16.96 ml/min/mmHg
FEF 25-75 POST: 0.85 L/s
FEF 25-75 Pre: 0.59 L/sec
FEF2575-%CHANGE-POST: 44 %
FEF2575-%PRED-PRE: 26 %
FEF2575-%Pred-Post: 39 %
FEV1-%Change-Post: 20 %
FEV1-%PRED-PRE: 49 %
FEV1-%Pred-Post: 60 %
FEV1-PRE: 1.35 L
FEV1-Post: 1.62 L
FEV1FVC-%Change-Post: 11 %
FEV1FVC-%PRED-PRE: 68 %
FEV6-%CHANGE-POST: 6 %
FEV6-%Pred-Post: 80 %
FEV6-%Pred-Pre: 75 %
FEV6-Post: 2.76 L
FEV6-Pre: 2.59 L
FEV6FVC-%Change-Post: 0 %
FEV6FVC-%PRED-POST: 103 %
FEV6FVC-%Pred-Pre: 103 %
FVC-%Change-Post: 7 %
FVC-%PRED-POST: 78 %
FVC-%PRED-PRE: 72 %
FVC-POST: 2.84 L
FVC-Pre: 2.65 L
PRE FEV1/FVC RATIO: 51 %
PRE FEV6/FVC RATIO: 98 %
Post FEV1/FVC ratio: 57 %
Post FEV6/FVC ratio: 97 %
RV % pred: 117 %
RV: 2.37 L
TLC % PRED: 92 %
TLC: 5.38 L

## 2018-06-10 NOTE — Progress Notes (Signed)
PFT done today. 

## 2018-06-11 ENCOUNTER — Telehealth: Payer: Self-pay | Admitting: Pulmonary Disease

## 2018-06-11 NOTE — Telephone Encounter (Signed)
Notes recorded by Joella Prince, RN on 06/11/2018 at 2:50 PM EST Notified patient by phone of the following PFT results which was reviewed by Dr. Ander Slade as follows: PFT shows severe obstructive disease with significant bronchodilator response. Continue using bronchodilators. Routine follow-up in the office as scheduled  Patient acknowledged understanding of above information. Will keep regularly scheduled OV or call sooner if he needs sooner assistance. Nothing further needed. _____________________________________________________________________________________  Hulen Skains and spoke with patient he stated that he had a few questions about his results. He wanted to know what the breakdown was for severe obstructive disease and will this be managed or will it get progressively worse. AO please advise, thank you.

## 2018-06-14 MED ORDER — ALBUTEROL SULFATE HFA 108 (90 BASE) MCG/ACT IN AERS: 2.0000 | INHALATION_SPRAY | RESPIRATORY_TRACT | 5 refills | Status: DC | PRN

## 2018-06-14 NOTE — Telephone Encounter (Signed)
Yes, it is a slowly progressive disease  The severe disease is based on multiple factors, most importantly past smoking, other exposures to dust, fumes, chemicals may contribute to progression  Treatment is still inhalers and not to continue to do harm to the lungs- smoking

## 2018-06-14 NOTE — Telephone Encounter (Signed)
Spoke with pt. He is aware of Dr. [name] response. Pt would like to have an albuterol prescription sent in just for him to have on hand in case he needed it.  Dr. Ander Slade - please advise. Thanks.

## 2018-06-14 NOTE — Telephone Encounter (Signed)
Rx has been sent in per Dr. Ander Slade. Pt is aware. Nothing further was needed.

## 2018-06-14 NOTE — Telephone Encounter (Signed)
Previously routed to Dr Ander Slade

## 2018-06-14 NOTE — Telephone Encounter (Signed)
Albuterol MDI 2 puffs 4 times daily as needed for shortness of breath

## 2018-06-16 ENCOUNTER — Telehealth (HOSPITAL_COMMUNITY): Payer: Self-pay | Admitting: *Deleted

## 2018-06-16 NOTE — Telephone Encounter (Signed)
Patient given detailed instructions per Myocardial Perfusion Study Information Sheet for the test on 06/22/18 at 0800. Patient notified to arrive 15 minutes early and that it is imperative to arrive on time for appointment to keep from having the test rescheduled.  If you need to cancel or reschedule your appointment, please call the office within 24 hours of your appointment. . Patient verbalized understanding.Batoul Limes, Ranae [place]ms Patient does not use mychart

## 2018-06-17 ENCOUNTER — Encounter: Payer: Self-pay | Admitting: Family Medicine

## 2018-06-17 ENCOUNTER — Ambulatory Visit: Payer: BLUE CROSS/BLUE SHIELD | Admitting: Family Medicine

## 2018-06-17 VITALS — BP 138/78 | HR 84 | Ht 64.0 in | Wt 207.5 lb

## 2018-06-17 DIAGNOSIS — Z23 Encounter for immunization: Secondary | ICD-10-CM | POA: Diagnosis not present

## 2018-06-17 DIAGNOSIS — R030 Elevated blood-pressure reading, without diagnosis of hypertension: Secondary | ICD-10-CM

## 2018-06-17 DIAGNOSIS — I2510 Atherosclerotic heart disease of native coronary artery without angina pectoris: Secondary | ICD-10-CM | POA: Diagnosis not present

## 2018-06-17 DIAGNOSIS — R0602 Shortness of breath: Secondary | ICD-10-CM | POA: Diagnosis not present

## 2018-06-17 DIAGNOSIS — E559 Vitamin D deficiency, unspecified: Secondary | ICD-10-CM | POA: Diagnosis not present

## 2018-06-17 LAB — BASIC METABOLIC PANEL
BUN: 28 mg/dL — ABNORMAL HIGH (ref 6–23)
CO2: 29 mEq/L (ref 19–32)
Calcium: 9.4 mg/dL (ref 8.4–10.5)
Chloride: 103 mEq/L (ref 96–112)
Creatinine, Ser: 0.98 mg/dL (ref 0.40–1.50)
GFR: 76.61 mL/min (ref >60.00)
Glucose, Bld: 108 mg/dL — ABNORMAL HIGH (ref 70–99)
Potassium: 4.5 mEq/L (ref 3.5–5.1)
Sodium: 141 mEq/L (ref 135–145)

## 2018-06-17 LAB — VITAMIN D 25 HYDROXY (VIT D DEFICIENCY, FRACTURES): VITD: 42.69 ng/mL (ref 30.00–100.00)

## 2018-06-17 LAB — LDL CHOLESTEROL, DIRECT: Direct LDL: 126 mg/dL

## 2018-06-17 MED ORDER — SIMVASTATIN 40 MG PO TABS: 40.0000 mg | ORAL_TABLET | Freq: Every day | ORAL | 3 refills | Status: DC

## 2018-06-17 MED ORDER — ATORVASTATIN CALCIUM 40 MG PO TABS: 40.0000 mg | ORAL_TABLET | Freq: Every day | ORAL | 3 refills | Status: DC

## 2018-06-17 NOTE — Progress Notes (Addendum)
Established Patient Office Visit  Subjective:  Patient ID: Justin Boyd, male    DOB: 1950/06/18  Age: 68 y.o. MRN: [id number]  CC:  Chief Complaint  Patient presents with  . Follow-up    HPI Justin Boyd presents for follow-up of his shortness of breath, elevated blood pressure vitamin D deficiency and 2nd Shingrix vaccine.  Patient continues to have some shortness of breath with exertion but denies chest pain paresis palpitations or nausea.  He is scheduled for an exercise stress test on Tuesday.  Blood pressure remains elevated.  He has been taking the Lipitor without issue over the last 2-1/2 months.  He has been taking the vitamin D weekly as well.  Pulmonology confirms COPD he is currently taking Trelegy.  Past Medical History:  Diagnosis Date  . Chicken pox   . Hyperlipidemia     Past Surgical History:  Procedure Laterality Date  . carpal tunnel both hands  2010  . SHOULDER SURGERY Right 2016    Family History  Problem Relation Age of Onset  . Hypertension Mother   . Stroke Mother     Social History   Socioeconomic History  . Marital status: Married    Spouse name: Not on file  . Number of children: Not on file  . Years of education: Not on file  . Highest education level: Not on file  Occupational History  . Not on file  Social Needs  . Financial resource strain: Not on file  . Food insecurity:    Worry: Not on file    Inability: Not on file  . Transportation needs:    Medical: Not on file    Non-medical: Not on file  Tobacco Use  . Smoking status: Former Smoker    Packs/day: 1.00    Years: 45.00    Pack years: 45.00    Types: Cigarettes    Last attempt to quit: 05/05/2018    Years since quitting: 0.1  . Smokeless tobacco: Never Used  Substance and Sexual Activity  . Alcohol use: Yes  . Drug use: Never  . Sexual activity: Yes    Partners: Female  Lifestyle  . Physical activity:    Days per week: Not on file    Minutes per session: Not on  file  . Stress: Not on file  Relationships  . Social connections:    Talks on phone: Not on file    Gets together: Not on file    Attends religious service: Not on file    Active member of club or organization: Not on file    Attends meetings of clubs or organizations: Not on file    Relationship status: Not on file  . Intimate partner violence:    Fear of current or ex partner: Not on file    Emotionally abused: Not on file    Physically abused: Not on file    Forced sexual activity: Not on file  Other Topics Concern  . Not on file  Social History Narrative  . Not on file    Outpatient Medications Prior to Visit  Medication Sig Dispense Refill  . albuterol (PROVENTIL HFA;VENTOLIN HFA) 108 (90 Base) MCG/ACT inhaler Inhale 2 puffs into the lungs every 4 (four) hours as needed for wheezing or shortness of breath. 1 Inhaler 5  . atorvastatin (LIPITOR) 20 MG tablet Take 1 tablet (20 mg total) by mouth daily. 90 tablet 3  . Fluticasone-Umeclidin-Vilant (TRELEGY ELLIPTA) 100-62.5-25 MCG/INH AEPB Inhale 1 puff into the lungs  daily. 60 each 3  . magnesium citrate SOLN Take 1 Bottle by mouth once.    . pantoprazole (PROTONIX) 40 MG tablet Take 40 mg by mouth daily.    . Vitamin D, Ergocalciferol, (DRISDOL) 1.25 MG (50000 UT) CAPS capsule Take 1 capsule (50,000 Units total) by mouth every 7 (seven) days. 30 capsule 1  . Fluticasone-Umeclidin-Vilant (TRELEGY ELLIPTA) 100-62.5-25 MCG/INH AEPB Inhale 1 puff into the lungs daily. 2 each 0   No facility-administered medications prior to visit.     Allergies  Allergen Reactions  . Oxycodone-Acetaminophen Other (See Comments)    Insomnia, so is able to take it Insomnia, so is able to take it   . Hydrocodone-Acetaminophen Itching    insomnia insomnia   . Penicillins Swelling    Extreme soreness Extreme soreness     ROS Review of Systems  Constitutional: Negative.   HENT: Negative.   Eyes: Negative for photophobia and visual  disturbance.  Respiratory: Positive for shortness of breath. Negative for chest tightness and wheezing.   Cardiovascular: Negative for chest pain, palpitations and leg swelling.  Gastrointestinal: Negative.   Endocrine: Negative for polyphagia and polyuria.  Genitourinary: Negative.   Musculoskeletal: Negative for gait problem and joint swelling.  Skin: Negative for pallor and rash.  Allergic/Immunologic: Negative for immunocompromised state.  Neurological: Negative for light-headedness and headaches.  Hematological: Negative.   Psychiatric/Behavioral: Negative.       Objective:    Physical Exam  Constitutional: He is oriented to person, place, and time. He appears well-developed and well-nourished. No distress.  HENT:  Head: Normocephalic and atraumatic.  Right Ear: External ear normal.  Left Ear: External ear normal.  Mouth/Throat: Oropharynx is clear and moist. No oropharyngeal exudate.  Eyes: Pupils are equal, round, and reactive to light. Right eye exhibits no discharge. Left eye exhibits no discharge. No scleral icterus.  Neck: Neck supple. No JVD present. No tracheal deviation present. No thyromegaly present.  Cardiovascular: Normal rate, regular rhythm and normal heart sounds.  Pulmonary/Chest: Effort normal and breath sounds normal. No stridor.  Lymphadenopathy:    He has no cervical adenopathy.  Neurological: He is alert and oriented to person, place, and time.  Skin: Skin is warm and dry. He is not diaphoretic.  Psychiatric: He has a normal mood and affect. His behavior is normal.    BP 138/78   Pulse 84   Ht 5\' 4"  (1.626 m)   Wt 207 lb 8 oz (94.1 kg)   SpO2 93%   BMI 35.62 kg/m  Wt Readings from Last 3 Encounters:  06/17/18 207 lb 8 oz (94.1 kg)  06/09/18 208 lb (94.3 kg)  05/25/18 202 lb (91.6 kg)   BP Readings from Last 3 Encounters:  06/17/18 138/78  06/09/18 (!) 158/90  05/25/18 130/72   Guideline developer:  UpToDate (see UpToDate for funding  source) Date Released: June 2014  There are no preventive care reminders to display for this patient.  There are no preventive care reminders to display for this patient.  Lab Results  Component Value Date   TSH 1.16 03/29/2018   Lab Results  Component Value Date   WBC 5.9 03/29/2018   HGB 16.6 03/29/2018   HCT 49.5 03/29/2018   MCV 90.8 03/29/2018   PLT 152.0 03/29/2018   Lab Results  Component Value Date   NA 141 06/17/2018   K 4.5 06/17/2018   CO2 29 06/17/2018   GLUCOSE 108 (H) 06/17/2018   BUN 28 (H) 06/17/2018  CREATININE 0.98 06/17/2018   BILITOT 0.6 03/29/2018   ALKPHOS 98 03/29/2018   AST 14 03/29/2018   ALT 27 03/29/2018   PROT 6.3 03/29/2018   ALBUMIN 4.0 03/29/2018   CALCIUM 9.4 06/17/2018   GFR 76.61 06/17/2018   Lab Results  Component Value Date   CHOL 211 (H) 03/29/2018   Lab Results  Component Value Date   HDL 34.60 (L) 03/29/2018   No results found for: [facility] Lab Results  Component Value Date   TRIG 211.0 (H) 03/29/2018   Lab Results  Component Value Date   CHOLHDL 6 03/29/2018   No results found for: HGBA1C    Assessment & Plan:   Problem List Items Addressed This Visit      Cardiovascular and Mediastinum   Atherosclerosis of coronary artery - Primary   Relevant Medications   simvastatin (ZOCOR) 40 MG tablet   Other Relevant Orders   LDL cholesterol, direct (Completed)     Other   SOB (shortness of breath) on exertion   Elevated BP without diagnosis of hypertension   Relevant Orders   Basic metabolic panel (Completed)   Vitamin D deficiency   Relevant Orders   VITAMIN D 25 Hydroxy (Vit-D Deficiency, Fractures) (Completed)    Other Visit Diagnoses    Need for shingles vaccine       Relevant Orders   Varicella-zoster vaccine IM (Completed)      Meds ordered this encounter  Medications  . simvastatin (ZOCOR) 40 MG tablet    Sig: Take 1 tablet (40 mg total) by mouth at bedtime.    Dispense:  90 tablet    Refill:   3    Follow-up: Return in about 2 weeks (around 07/01/2018).  Stress test Tuesday.  Follow-up in 2 weeks for blood pressure recheck.  May consider cardioselective beta-blocker.  Caution regarding history of COPD.  Congratulated patient for quitting smoking.

## 2018-06-17 NOTE — Patient Instructions (Signed)
Exercise Guidelines During Cardiac Rehabilitation  When you are recovering from a heart condition, such as from heart surgery, heart attack, or heart failure, it is important to have heart-healthy habits, including exercise routines. Discuss an appropriate exercise program with your heart specialist (cardiologist) and rehabilitation therapist.  It is important to design a program that is safe and effective for you. The program should meet your specific abilities and needs. Walking, biking, jogging, and swimming are all good aerobic activities. These take light to moderate effort. Adding some light resistance training is also important. Even simple lifestyle changes can help. These lifestyle changes may include parking farther from the store or taking the stairs instead of the elevator.  At first, you may begin exercising under supervision, such as at a hospital or clinic. Over time, you may begin exercising at home, with your health care provider's approval.  Types of exercise  Aerobic exercise  During cardiac rehabilitation, it is important to do aerobic activities. Aerobic exercise keeps joints and muscles moving. It involves large muscle groups. It is also rhythmic and must be done for a longer period of time. Doing these exercises improves circulation and endurance. Examples of aerobic exercise include:  · Swimming.  · Walking.  · Hiking.  · Jogging.  · Cross-country skiing.  · Biking.  · Dancing.    Static exercise  Static exercise (isometric exercise) uses muscles at high intensities without moving the joints. Some examples of static exercise include pushing against a heavy couch that does not move, doing a wall sit, or holding a plank position. Static exercise improves strength but also quickly increases blood pressure. Follow these guidelines:  · If you have circulation problems or high blood pressure, talk with your health care provider before starting any static exercise routines. Do not do static  exercises if your health care provider tells you not to.  · Do not hold your breath while doing static exercises. Holding your breath during static exercises can raise your blood pressure to a dangerously high level.    Weight-resistance exercise  Weight-resistance exercises are another important part of rehabilitation. These exercises strengthen your muscles by making them work against resistance. Resistance exercises may help you return to activities of daily living sooner and improve your quality of life. They also help reduce cardiac risk factors. Examples of weight-resistance exercise include using:  · Free weights.  · Weight-lifting machines.  · Large, specially designed rubber bands.  You will usually do weight-resistance exercises 2 times a week with a 2-day rest period between workouts.  Stretching  Stretching before you exercise warms up your muscles and prevents injury. Stretching also improves your flexibility, balance, coordination, and range of motion. Follow these guidelines:  · Stretch both before and after exercising.  · Do not force a muscle or joint into a painful angle. Stretching should be a relaxing part of your exercise routine.  · Once you feel resistance in your muscle, hold the stretch for a few seconds. Make sure you keep breathing while you hold the stretch.  · Go slowly when doing all stretches.  Setting a pace  · Choose a pace that is comfortable for you.  ? You should be able to talk while exercising. If you are short of breath or unable to speak while you exercise, slow down.  ? If you are able to sing while exercising, you are not exercising hard enough.  · Keep track of how hard you are working as you exercise (exertion level).   Your rehabilitation therapist can teach you to use a mental scale to measure your level of exertion (perceived exertion). Using a mental scale, you will think about your exertion level and rate it in a range from 6 to 20.  ? A rating of 6 to 9. This means  that you are doing "very light" exercise and are not exerting yourself enough. For a healthy person, this may be walking at a slow pace.  ? A rating of 11 to 15. This is exercise that is "somewhat hard." For a healthy exercise session, you should aim for an exertion rate that is within this range.  ? A rating of 16 to 17. This is considered "very hard" or strenuous. For a healthy person, exercise at this rating may start to feel heavy and difficult.  ? A rating of 19 to 20. This means that you are working "extremely hard." For most people, these numbers represent the hardest you've ever worked to exercise.  · Your health care provider or cardiac rehabilitation specialist may also recommend that you wear a heart rate monitor while you exercise. This will help you keep track of your heart rate zones and how hard your heart is working.  Frequency  As you are recovering, it is important to start exercising slowly and to gradually work up to your goal. Work with your health care provider to set up an exercise routine that works for you. Generally, cardiac rehabilitation exercise should include:  · 40 minutes of aerobic activity 3 - 4 days a week.  · Stretching and strength exercises 2 - 3 days a week.  Contact a doctor if:  · You have any of the following symptoms while exercising:  ? Pain, pressure, or burning in your chest, jaw, shoulder, or back (angina).  ? Lightheadedness.  ? Dizziness.  ? Irregular or fast heartbeat.  ? Shortness of breath.  · You are extremely tired after exercising.  Get help right away if:  · You have angina that does not get better with medicine and lasts for more than 5 minutes.  · You have nausea or you vomit.  · You have excessive sweating that is not caused by exercise.  Summary  · When you are recovering from a heart condition, it is important to have heart-healthy habits, including exercise routines.  · At first, you may begin exercising under supervision, such as at a hospital or clinic.  Over time, you may begin exercising at home, with your health care provider's approval.  · Aim for 40 minutes of aerobic exercises 3 - 4 days a week.  · Aim to do stretching and strength exercises 2 - 3 days a week.  · Choose a pace that is comfortable for you. You should be able to talk while exercising.  This information is not intended to replace advice given to you by your health care provider. Make sure you discuss any questions you have with your health care provider.  Document Released: 04/26/2013 Document Revised: 03/21/2016 Document Reviewed: 03/21/2016  Elsevier Interactive Patient Education © 2019 [employer]

## 2018-06-17 NOTE — Addendum Note (Signed)
Addended by: Jon Billings on: 06/17/2018 02:59 PM   Modules accepted: Orders

## 2018-06-22 ENCOUNTER — Telehealth: Payer: Self-pay

## 2018-06-22 ENCOUNTER — Ambulatory Visit (INDEPENDENT_AMBULATORY_CARE_PROVIDER_SITE_OTHER): Payer: BLUE CROSS/BLUE SHIELD

## 2018-06-22 VITALS — Ht 64.0 in | Wt 202.0 lb

## 2018-06-22 DIAGNOSIS — I2510 Atherosclerotic heart disease of native coronary artery without angina pectoris: Secondary | ICD-10-CM

## 2018-06-22 DIAGNOSIS — R0602 Shortness of breath: Secondary | ICD-10-CM | POA: Diagnosis not present

## 2018-06-22 DIAGNOSIS — R030 Elevated blood-pressure reading, without diagnosis of hypertension: Secondary | ICD-10-CM

## 2018-06-22 LAB — MYOCARDIAL PERFUSION IMAGING
CHL CUP NUCLEAR SSS: 4
CSEPEDS: 38 s
CSEPHR: 88 %
CSEPPHR: 137 {beats}/min
Estimated workload: 8.7 METS
Exercise duration (min): 7 min
LV dias vol: 66 mL (ref 62–150)
LVSYSVOL: 20 mL
MPHR: 155 {beats}/min
Rest HR: 70 {beats}/min
SDS: 2
SRS: 2
TID: 0.92

## 2018-06-22 MED ORDER — TECHNETIUM TC 99M TETROFOSMIN IV KIT
31.4000 | PACK | Freq: Once | INTRAVENOUS | Status: AC | PRN
  Administered 2018-06-22: 31.4 via INTRAVENOUS

## 2018-06-22 MED ORDER — TECHNETIUM TC 99M TETROFOSMIN IV KIT
10.3000 | PACK | Freq: Once | INTRAVENOUS | Status: AC | PRN
  Administered 2018-06-22: 10.3 via INTRAVENOUS

## 2018-06-22 NOTE — Telephone Encounter (Signed)
-----   Message from Jenean Lindau, MD sent at 06/22/2018  4:27 PM EST ----- The results of the study is unremarkable. Please inform patient. I will discuss in detail at next appointment. Cc  primary care/referring physician Jenean Lindau, MD 06/22/2018 4:27 PM

## 2018-06-23 ENCOUNTER — Telehealth: Payer: Self-pay

## 2018-06-23 ENCOUNTER — Telehealth: Payer: Self-pay | Admitting: Cardiology

## 2018-06-23 NOTE — Telephone Encounter (Signed)
Returning your call. °

## 2018-06-23 NOTE — Telephone Encounter (Signed)
The patient has been notified of the result and verbalized understanding.  No further questions at this time.

## 2018-06-23 NOTE — Progress Notes (Signed)
Results relayed to patient and he verbalized understanding.No questions at this time. Copy of results forwarded to Dr. Ethelene Hal per Dr. Docia Furl request.

## 2018-06-23 NOTE — Telephone Encounter (Signed)
Results relayed to patient and he verbalized understanding.No questions at this time. Copy of results forwarded to Dr. Ethelene Hal per Dr. Docia Furl request.

## 2018-06-23 NOTE — Telephone Encounter (Signed)
-----   Message from Jenean Lindau, MD sent at 06/22/2018  4:27 PM EST ----- The results of the study is unremarkable. Please inform patient. I will discuss in detail at next appointment. Cc  primary care/referring physician Jenean Lindau, MD 06/22/2018 4:27 PM

## 2018-06-24 ENCOUNTER — Telehealth: Payer: Self-pay | Admitting: Pulmonary Disease

## 2018-06-24 NOTE — Telephone Encounter (Signed)
ATC pt, no answer. Left message for pt to call back.  

## 2018-06-24 NOTE — Telephone Encounter (Signed)
Pt is returning call CB# [phone]//[phone]

## 2018-06-24 NOTE — Telephone Encounter (Signed)
Spoke with pt, he states he would like to have 90 day supply of Trelegy on the next refill. I advised pt to call for refill and remind the nurse to send it in for 90 days. I changed his Rx in his chart to 90 days. Nothing further is needed.

## 2018-07-01 ENCOUNTER — Ambulatory Visit: Payer: BLUE CROSS/BLUE SHIELD | Admitting: Family Medicine

## 2018-07-01 ENCOUNTER — Encounter: Payer: Self-pay | Admitting: Family Medicine

## 2018-07-01 VITALS — BP 140/80 | HR 75 | Ht 64.0 in | Wt 208.1 lb

## 2018-07-01 DIAGNOSIS — R030 Elevated blood-pressure reading, without diagnosis of hypertension: Secondary | ICD-10-CM | POA: Diagnosis not present

## 2018-07-01 DIAGNOSIS — I2510 Atherosclerotic heart disease of native coronary artery without angina pectoris: Secondary | ICD-10-CM

## 2018-07-01 DIAGNOSIS — F419 Anxiety disorder, unspecified: Secondary | ICD-10-CM

## 2018-07-01 MED ORDER — METOPROLOL SUCCINATE ER 25 MG PO TB24: 25.0000 mg | ORAL_TABLET | Freq: Every day | ORAL | 0 refills | Status: DC

## 2018-07-01 MED ORDER — FLUOXETINE HCL 10 MG PO TABS: ORAL_TABLET | ORAL | 3 refills | Status: DC

## 2018-07-01 NOTE — Progress Notes (Signed)
Established Patient Office Visit  Subjective:  Patient ID: Justin Boyd, male    DOB: Jan 24, 1951  Age: 68 y.o. MRN: [id number]  CC:  Chief Complaint  Patient presents with  . Follow-up    HPI Bardia Wangerin presents for follow-up follow-up of his blood pressure after his stress test.  Things went well with the stress test.  There is no evidence of myocardial ischemia in the face of his coronary artery disease.  Patient tells of anxiety and weight gain status post quitting smoking.  Cardiology had recommended that he lose 30 pounds.  He has been exercising and was able to actually walk 3 miles yesterday.  Past Medical History:  Diagnosis Date  . Chicken pox   . Hyperlipidemia     Past Surgical History:  Procedure Laterality Date  . carpal tunnel both hands  2010  . SHOULDER SURGERY Right 2016    Family History  Problem Relation Age of Onset  . Hypertension Mother   . Stroke Mother     Social History   Socioeconomic History  . Marital status: Married    Spouse name: Not on file  . Number of children: Not on file  . Years of education: Not on file  . Highest education level: Not on file  Occupational History  . Not on file  Social Needs  . Financial resource strain: Not on file  . Food insecurity:    Worry: Not on file    Inability: Not on file  . Transportation needs:    Medical: Not on file    Non-medical: Not on file  Tobacco Use  . Smoking status: Former Smoker    Packs/day: 1.00    Years: 45.00    Pack years: 45.00    Types: Cigarettes    Last attempt to quit: 05/05/2018    Years since quitting: 0.1  . Smokeless tobacco: Never Used  Substance and Sexual Activity  . Alcohol use: Yes  . Drug use: Never  . Sexual activity: Yes    Partners: Female  Lifestyle  . Physical activity:    Days per week: Not on file    Minutes per session: Not on file  . Stress: Not on file  Relationships  . Social connections:    Talks on phone: Not on file    Gets  together: Not on file    Attends religious service: Not on file    Active member of club or organization: Not on file    Attends meetings of clubs or organizations: Not on file    Relationship status: Not on file  . Intimate partner violence:    Fear of current or ex partner: Not on file    Emotionally abused: Not on file    Physically abused: Not on file    Forced sexual activity: Not on file  Other Topics Concern  . Not on file  Social History Narrative  . Not on file    Outpatient Medications Prior to Visit  Medication Sig Dispense Refill  . albuterol (PROVENTIL HFA;VENTOLIN HFA) 108 (90 Base) MCG/ACT inhaler Inhale 2 puffs into the lungs every 4 (four) hours as needed for wheezing or shortness of breath. 1 Inhaler 5  . atorvastatin (LIPITOR) 40 MG tablet Take 1 tablet (40 mg total) by mouth daily. 90 tablet 3  . Fluticasone-Umeclidin-Vilant (TRELEGY ELLIPTA) 100-62.5-25 MCG/INH AEPB Inhale 1 puff into the lungs daily. 60 each 3  . magnesium citrate SOLN Take 1 Bottle by mouth once.    Marland Kitchen  pantoprazole (PROTONIX) 40 MG tablet Take 40 mg by mouth daily.    . Vitamin D, Ergocalciferol, (DRISDOL) 1.25 MG (50000 UT) CAPS capsule Take 1 capsule (50,000 Units total) by mouth every 7 (seven) days. 30 capsule 1   No facility-administered medications prior to visit.     Allergies  Allergen Reactions  . Oxycodone-Acetaminophen Other (See Comments)    Insomnia, so is able to take it Insomnia, so is able to take it   . Hydrocodone-Acetaminophen Itching    insomnia insomnia   . Penicillins Swelling    Extreme soreness Extreme soreness     ROS Review of Systems  Constitutional: Negative.   HENT: Negative.   Respiratory: Negative for chest tightness, shortness of breath and wheezing.   Cardiovascular: Negative for chest pain and palpitations.  Gastrointestinal: Negative.   Genitourinary: Negative.   Musculoskeletal: Negative for gait problem and joint swelling.    Allergic/Immunologic: Negative for immunocompromised state.  Neurological: Negative for headaches.  Hematological: Does not bruise/bleed easily.       Depression screen [facility] 2/9 07/01/2018  Decreased Interest 1  Down, Depressed, Hopeless 1  PHQ - 2 Score 2  Altered sleeping 1  Tired, decreased energy 1  Change in appetite 1  Feeling bad or failure about yourself  0  Trouble concentrating 0  Moving slowly or fidgety/restless 1  Suicidal thoughts 0  PHQ-9 Score 6    Objective:    Physical Exam  Constitutional: He is oriented to person, place, and time. He appears well-developed and well-nourished. No distress.  HENT:  Head: Normocephalic and atraumatic.  Right Ear: External ear normal.  Left Ear: External ear normal.  Eyes: Conjunctivae are normal. Right eye exhibits no discharge. Left eye exhibits no discharge. No scleral icterus.  Neck: Neck supple. No JVD present. No tracheal deviation present.  Cardiovascular: Normal rate, regular rhythm and normal heart sounds.  Pulmonary/Chest: Effort normal. No stridor.  Neurological: He is alert and oriented to person, place, and time.  Skin: Skin is warm. He is not diaphoretic.  Psychiatric: He has a normal mood and affect. His behavior is normal.    BP 140/80   Pulse 75   Ht 5\' 4"  (1.626 m)   Wt 208 lb 2 oz (94.4 kg)   BMI 35.72 kg/m  Wt Readings from Last 3 Encounters:  07/01/18 208 lb 2 oz (94.4 kg)  06/22/18 202 lb (91.6 kg)  06/17/18 207 lb 8 oz (94.1 kg)   BP Readings from Last 3 Encounters:  07/01/18 140/80  06/17/18 138/78  06/09/18 (!) 158/90   Guideline developer:  UpToDate (see UpToDate for funding source) Date Released: June 2014  There are no preventive care reminders to display for this patient.  There are no preventive care reminders to display for this patient.  Lab Results  Component Value Date   TSH 1.16 03/29/2018   Lab Results  Component Value Date   WBC 5.9 03/29/2018   HGB 16.6 03/29/2018    HCT 49.5 03/29/2018   MCV 90.8 03/29/2018   PLT 152.0 03/29/2018   Lab Results  Component Value Date   NA 141 06/17/2018   K 4.5 06/17/2018   CO2 29 06/17/2018   GLUCOSE 108 (H) 06/17/2018   BUN 28 (H) 06/17/2018   CREATININE 0.98 06/17/2018   BILITOT 0.6 03/29/2018   ALKPHOS 98 03/29/2018   AST 14 03/29/2018   ALT 27 03/29/2018   PROT 6.3 03/29/2018   ALBUMIN 4.0 03/29/2018   CALCIUM 9.4  06/17/2018   GFR 76.61 06/17/2018   Lab Results  Component Value Date   CHOL 211 (H) 03/29/2018   Lab Results  Component Value Date   HDL 34.60 (L) 03/29/2018   No results found for: [facility] Lab Results  Component Value Date   TRIG 211.0 (H) 03/29/2018   Lab Results  Component Value Date   CHOLHDL 6 03/29/2018   No results found for: HGBA1C    Assessment & Plan:   Problem List Items Addressed This Visit      Cardiovascular and Mediastinum   Atherosclerosis of coronary artery - Primary   Relevant Medications   metoprolol succinate (TOPROL-XL) 25 MG 24 hr tablet     Other   Elevated BP without diagnosis of hypertension   Relevant Medications   metoprolol succinate (TOPROL-XL) 25 MG 24 hr tablet   RESOLVED: Depression with anxiety   Relevant Medications   FLUoxetine (PROZAC) 10 MG tablet      Meds ordered this encounter  Medications  . metoprolol succinate (TOPROL-XL) 25 MG 24 hr tablet    Sig: Take 1 tablet (25 mg total) by mouth daily.    Dispense:  90 tablet    Refill:  0  . FLUoxetine (PROZAC) 10 MG tablet    Sig: Take one each morning for one week and then increase to 2 each day.    Dispense:  60 tablet    Refill:  3    Follow-up: Return in about 1 month (around 07/30/2018).   We will start low-dose metoprolol for cardioprotection.  He will report any changes in his COPD symptoms.  We will start Prozac for anxiety and apparent dysthymia.  Hopefully this drug will also possibly lead to some weight loss.  Follow-up in 1 month.

## 2018-07-08 DIAGNOSIS — H35362 Drusen (degenerative) of macula, left eye: Secondary | ICD-10-CM | POA: Diagnosis not present

## 2018-07-30 ENCOUNTER — Telehealth: Payer: Self-pay | Admitting: Pulmonary Disease

## 2018-07-30 ENCOUNTER — Encounter: Payer: Self-pay | Admitting: Family Medicine

## 2018-07-30 ENCOUNTER — Ambulatory Visit (INDEPENDENT_AMBULATORY_CARE_PROVIDER_SITE_OTHER): Payer: BLUE CROSS/BLUE SHIELD | Admitting: Family Medicine

## 2018-07-30 VITALS — BP 135/75 | HR 69 | Wt 204.0 lb

## 2018-07-30 DIAGNOSIS — G2581 Restless legs syndrome: Secondary | ICD-10-CM

## 2018-07-30 DIAGNOSIS — I2510 Atherosclerotic heart disease of native coronary artery without angina pectoris: Secondary | ICD-10-CM

## 2018-07-30 DIAGNOSIS — E7800 Pure hypercholesterolemia, unspecified: Secondary | ICD-10-CM | POA: Diagnosis not present

## 2018-07-30 DIAGNOSIS — F419 Anxiety disorder, unspecified: Secondary | ICD-10-CM

## 2018-07-30 HISTORY — DX: Restless legs syndrome: G25.81

## 2018-07-30 MED ORDER — ROSUVASTATIN CALCIUM 10 MG PO TABS: 10.0000 mg | ORAL_TABLET | Freq: Every day | ORAL | 1 refills | Status: DC

## 2018-07-30 MED ORDER — GABAPENTIN 300 MG PO CAPS: 300.0000 mg | ORAL_CAPSULE | Freq: Three times a day (TID) | ORAL | 3 refills | Status: DC

## 2018-07-30 MED ORDER — FLUOXETINE HCL 10 MG PO TABS: ORAL_TABLET | ORAL | 3 refills | Status: DC

## 2018-07-30 MED ORDER — FLUTICASONE-UMECLIDIN-VILANT 100-62.5-25 MCG/INH IN AEPB: 1.0000 | INHALATION_SPRAY | Freq: Every day | RESPIRATORY_TRACT | 1 refills | Status: DC

## 2018-07-30 NOTE — Telephone Encounter (Signed)
Called and spoke with Patient. Patient request a refill of Trelegy to be sent to [facility], [place]. Patient requested a 90 day supply, per Patient insurance.  Requested prescription sent to pharmacy.  Nothing further at this time.

## 2018-07-30 NOTE — Progress Notes (Addendum)
Virtual Visit via Video Note  I connected with Justin Boyd on 08/03/18 at 10:00 AM EDT by a video enabled telemedicine application and verified that I am speaking with the correct person using two identifiers.   I discussed the limitations of evaluation and management by telemedicine and the availability of in person appointments. The patient expressed understanding and agreed to proceed.  History of Present Illness: Follow-up of his elevated LDL cholesterol associated with arterial sclerosis involving the arteries of his heart.  He has been taking Lipitor 40 mg daily.  LDL remains elevated at 126.  He feels as though he may have developed some shortness of breath when exercising while he was taking the low-dose metoprolol.  His doing quite well on the low-dose Prozac.  He would like to remain on it.  Anxiety is much less than it has been before.  Denies that present depression is been an issue for him.  More recently he is developed restless syndrome where his legs at nighttime.  He was diagnosed with this during a sleep study some years ago.  He had taken a medication for short time that it been helpful.  Blood pressure has remained in the less than 1    Observations/Objective: Patient appears well and is answering questions appropriately and is in no acute distress.  Depression screen [facility] 2/9 07/30/2018 07/30/2018 07/01/2018  Decreased Interest 0 0 1  Down, Depressed, Hopeless 0 0 1  PHQ - 2 Score 0 0 2  Altered sleeping 1 1 1   Tired, decreased energy 1 1 1   Change in appetite 0 0 1  Feeling bad or failure about yourself  0 0 0  Trouble concentrating 0 0 0  Moving slowly or fidgety/restless 0 0 1  Suicidal thoughts 0 0 0  PHQ-9 Score 2 2 6     Assessment and Plan:1. Elevated LDL cholesterol level  - rosuvastatin (CRESTOR) 10 MG tablet; Take 1 tablet (10 mg total) by mouth daily.  Dispense: 90 tablet; Refill: 1  2. Atherosclerosis of native coronary artery of native heart without angina  pectoris   3. Restless leg syndrome.diagnosis 1. Elevated LDL cholesterol level  - rosuvastatin (CRESTOR) 10 MG tablet; Take 1 tablet (10 mg total) by mouth daily.  Dispense: 90 tablet; Refill: 1  2. Atherosclerosis of native coronary artery of native heart without angina pectoris    3. Restless leg syndrome  - gabapentin (NEURONTIN) 300 MG capsule; Take 1 capsule (300 mg total) by mouth at bedtime.  Dispense: 90 capsule; Refill: 3  4. Anxiety disorder, unspecified type  - FLUoxetine (PROZAC) 10 MG tablet; Take one each morning for one week and then increase to 2 each day.  Dispense: 60 tablet; Refill: 3   4. Anxiety disorder, unspecified type  - FLUoxetine (PROZAC) 10 MG tablet; Take one each morning for one week and then increase to 2 each day.  Dispense: 60 tablet; Refill: 3   Follow Up Instructions:  Patient will taper the remaining metoprolol.  He will continue to monitor his blood pressure and let me know if it starts to average over 983 systolically.  We will continue Prozac for now.  We will switch to Crestor for control of his LDL cholesterol.  We discussed that his target would be in the 70 or less range.  We will try Neurontin 300 nightly for his restless legs.  He will let me know if he has any issue with this.  Follow-up will be in 3 months unless he  has any problem.    I discussed the assessment and treatment plan with the patient. The patient was provided an opportunity to ask questions and all were answered. The patient agreed with the plan and demonstrated an understanding of the instructions.   The patient was advised to call back or seek an in-person evaluation if the symptoms worsen or if the condition fails to improve as anticipated.  I provided 30 minutes of non-face-to-face time during this encounter.   [name], MD

## 2018-08-02 ENCOUNTER — Telehealth: Payer: Self-pay | Admitting: Family Medicine

## 2018-08-02 NOTE — Telephone Encounter (Signed)
Patient wants the nurse to call him concerning the medication Gabapentin he wants dosage clarification.   [phone]

## 2018-08-03 MED ORDER — GABAPENTIN 300 MG PO CAPS: 300.0000 mg | ORAL_CAPSULE | Freq: Every day | ORAL | 3 refills | Status: DC

## 2018-08-03 NOTE — Telephone Encounter (Signed)
Is patient suppose to take 1 capsule three times daily?

## 2018-08-03 NOTE — Telephone Encounter (Signed)
No. He is correct. For him it should only be taken at night. I changed it. Thank you.

## 2018-08-03 NOTE — Addendum Note (Signed)
Addended by: Abelino Derrick A on: 08/03/2018 09:00 AM   Modules accepted: Orders

## 2018-08-03 NOTE — Telephone Encounter (Signed)
I called and spoke with patient. We went over the information below & he verbalized understanding.  

## 2018-08-23 ENCOUNTER — Encounter: Payer: Self-pay | Admitting: Family Medicine

## 2018-09-06 ENCOUNTER — Telehealth: Payer: Self-pay | Admitting: Acute Care

## 2018-09-06 ENCOUNTER — Encounter: Payer: Self-pay | Admitting: Acute Care

## 2018-09-06 ENCOUNTER — Ambulatory Visit: Payer: BLUE CROSS/BLUE SHIELD | Admitting: Acute Care

## 2018-09-06 ENCOUNTER — Other Ambulatory Visit: Payer: Self-pay

## 2018-09-06 VITALS — BP 132/78 | HR 72 | Temp 98.1°F | Ht 64.0 in | Wt 214.0 lb

## 2018-09-06 DIAGNOSIS — J432 Centrilobular emphysema: Secondary | ICD-10-CM

## 2018-09-06 DIAGNOSIS — J439 Emphysema, unspecified: Secondary | ICD-10-CM | POA: Diagnosis not present

## 2018-09-06 HISTORY — DX: Centrilobular emphysema: J43.2

## 2018-09-06 NOTE — Patient Instructions (Addendum)
It is good to see you today. We will add Mucinex 1200 mg once daily . Take with a full glass of water. We will continue Trelegy 1 puff once daily  Rinse mouth after use. We can decide at your follow up if you want to continue Trelegy. Continue Rescue inhaler as needed for breakthrough shortness of breath of wheezing. Follow up in 3 months with Dr. Ander Slade Please contact office for sooner follow up if symptoms do not improve or worsen or seek emergency care  Continue exercising as you have been doing. You are doing great. Next low dose CT for Lung Cancer Screening is due 05/2019.  Note your daily symptoms > remember "red flags" for COPD:  Increase in cough, increase in sputum production, increase in shortness of breath or activity intolerance. If you notice these symptoms, please call to be seen.

## 2018-09-06 NOTE — Telephone Encounter (Signed)
Called and spoke with pt. Pt is requesting to know which type of Mucinex SG was wanting him to take whether she was wanting him to take regular Mucinex or Mucinex DM. I stated to pt that we would check with SG and then let him know once we heard from her and pt verbalized understanding.  [name], based on the visit pt had with you, please advise which mucinex you prefer for pt to get. Thanks!

## 2018-09-06 NOTE — Progress Notes (Signed)
History of Present Illness Justin Boyd is a 68 y.o. male recent former smoker ( Quit 05/2018) with a 45 pack year smoking history and  Emphysema and COPD.Marland Kitchen He is followed by Dr. Ander Boyd. He is part of the Justin Boyd  Of note he has had a 20 pound weight gain since quitting smoking.He is walking every day, and eating a high protein low carb diet.   09/06/2018  Pt. Presents for follow up. Dr. Ander Boyd started the patient on Trelegy 3 months ago. He states that  cannot tell any difference on  Trelegy, that he can attribute to the medication . He has quit smoking in January and has been exercising more and he feels his breathing is better due to these life changes. He has been taking his Trelegy daily however.He does not want to stop at present. He  states that he has continued to gain weight despite continued exercise and watching his diet. He is following a high protein low carb diet. He is walking 1200 steps daily 6 days a week. He states he does feel better since he quit smoking. He states his mucus is thick at times, but clear. He denies fever, chest pain, orthopnea or hemoptysis.  He is self isolating, wearing mask whenever he is out of the house. He is practicing social distancing. He is aware he is in a high risk group in regard to Covid 19, and he is appropriately protecting himself.  Test Results: 05/12/2018 LDCT Lung-RADS 2, benign appearance or behavior. Continue annual screening with low-dose chest CT without contrast in 12 months. Aortic atherosclerosis (ICD10-170.0). Coronary artery calcification.  CBC Latest Ref Rng & Units 03/29/2018 08/20/2010 04/12/2010  WBC 4.0 - 10.5 K/uL 5.9 - -  Hemoglobin 13.0 - 17.0 g/dL 16.6 16.5 17.8(H)  Hematocrit 39.0 - 52.0 % 49.5 - -  Platelets 150.0 - 400.0 K/uL 152.0 - -    BMP Latest Ref Rng & Units 06/17/2018 03/29/2018  Glucose 70 - 99 mg/dL 108(H) 101(H)  BUN 6 - 23 mg/dL 28(H) 19  Creatinine 0.40 - 1.50 mg/dL  0.98 1.07  Sodium 135 - 145 mEq/L 141 142  Potassium 3.5 - 5.1 mEq/L 4.5 4.4  Chloride 96 - 112 mEq/L 103 104  CO2 19 - 32 mEq/L 29 31  Calcium 8.4 - 10.5 mg/dL 9.4 9.3    BNP No results found for: BNP  ProBNP No results found for: PROBNP  PFT    Component Value Date/Time   FEV1PRE 1.35 06/10/2018 1533   FEV1POST 1.62 06/10/2018 1533   FVCPRE 2.65 06/10/2018 1533   FVCPOST 2.84 06/10/2018 1533   TLC 5.38 06/10/2018 1533   DLCOUNC 16.96 06/10/2018 1533   PREFEV1FVCRT 51 06/10/2018 1533   PSTFEV1FVCRT 57 06/10/2018 1533    No results found.   Past medical hx Past Medical History:  Diagnosis Date  . Chicken pox   . Hyperlipidemia      Social History   Tobacco Use  . Smoking status: Former Smoker    Packs/day: 1.00    Years: 45.00    Pack years: 45.00    Types: Cigarettes    Last attempt to quit: 05/05/2018    Years since quitting: 0.3  . Smokeless tobacco: Never Used  Substance Use Topics  . Alcohol use: Yes  . Drug use: Never    Justin Boyd reports that he quit smoking about 4 months ago. His smoking use included cigarettes. He has a 45.00 pack-year smoking history. He  has never used smokeless tobacco. He reports current alcohol use. He reports that he does not use drugs.  Tobacco Cessation: Former smoker with a 45 pack year smoking history, Quit 05/2018 and is doing well with cessation.  Past surgical hx, Family hx, Social hx all reviewed.  Current Outpatient Medications on File Prior to Visit  Medication Sig  . albuterol (PROVENTIL HFA;VENTOLIN HFA) 108 (90 Base) MCG/ACT inhaler Inhale 2 puffs into the lungs every 4 (four) hours as needed for wheezing or shortness of breath.  Marland Kitchen FLUoxetine (PROZAC) 10 MG tablet Take one each morning for one week and then increase to 2 each day.  . Fluticasone-Umeclidin-Vilant (TRELEGY ELLIPTA) 100-62.5-25 MCG/INH AEPB Inhale 1 puff into the lungs daily.  Marland Kitchen gabapentin (NEURONTIN) 300 MG capsule Take 1 capsule (300 mg total)  by mouth at bedtime.  . magnesium citrate SOLN Take 1 Bottle by mouth once.  . pantoprazole (PROTONIX) 40 MG tablet Take 40 mg by mouth daily.  . rosuvastatin (CRESTOR) 10 MG tablet Take 1 tablet (10 mg total) by mouth daily.   No current facility-administered medications on file prior to visit.      Allergies  Allergen Reactions  . Oxycodone-Acetaminophen Other (See Comments)    Insomnia, so is able to take it Insomnia, so is able to take it   . Hydrocodone-Acetaminophen Itching    insomnia insomnia   . Penicillins Swelling    Extreme soreness Extreme soreness     Review Of Systems:  Constitutional:   No  weight loss, night sweats,  Fevers, chills, fatigue, or  lassitude.  HEENT:   No headaches,  Difficulty swallowing,  Tooth/dental problems, or  Sore throat,                No sneezing, itching, ear ache, nasal congestion, post nasal drip,   CV:  No chest pain,  Orthopnea, PND, swelling in lower extremities, anasarca, dizziness, palpitations, syncope.   GI  No heartburn, indigestion, abdominal pain, nausea, vomiting, diarrhea, change in bowel habits, loss of appetite, bloody stools.   Resp: + shortness of breath with exertion none  at rest.  + excess clear mucus, no productive cough,  No non-productive cough,  No coughing up of blood.  No change in color of mucus.  No wheezing.  No chest wall deformity  Skin: no rash or lesions.  GU: no dysuria, change in color of urine, no urgency or frequency.  No flank pain, no hematuria   MS:  No joint pain or swelling.  No decreased range of motion.  No back pain.  Psych:  No change in mood or affect. No depression or anxiety.  No memory loss.   Vital Signs BP 132/78 (BP Location: Left Arm, Patient Position: Sitting, Cuff Size: Normal)   Pulse 72   Temp 98.1 F (36.7 C) (Oral)   Ht 5\' 4"  (1.626 m)   Wt 214 lb (97.1 kg)   SpO2 94%   BMI 36.73 kg/m    Physical Exam:  General- No distress,  A&Ox3, Pleasant and  appropriate ENT: No sinus tenderness, TM clear, pale nasal mucosa, no oral exudate,no post nasal drip, no LAN Cardiac: S1, S2, regular rate and rhythm, no murmur Chest: No wheeze/ rales/ dullness; no accessory muscle use, no nasal flaring, no sternal retractions Abd.: Soft Non-tender, ND, BS +, Body mass index is 36.73 kg/m. Ext: No clubbing cyanosis, edema, no obvious abnormalities Neuro:  normal strength, A&O x 3, MAE x 4, appropriate Skin: No rashes,lesions  warm and dry Psych: normal mood and behavior   Assessment/Plan  Centrilobular emphysema (HCC) COPD It is great to see you today. We will add Mucinex 1200 mg once daily for thick secretions. Take with a full glass of water. We will continue Trelegy 1 puff once daily  Rinse mouth after use. We can decide at your follow up if you want to continue taking Trelegy. Continue Rescue inhaler as needed for breakthrough shortness of breath of wheezing. Follow up in 3 months with Dr. Ander Boyd Please contact office for sooner follow up if symptoms do not improve or worsen or seek emergency care  Continue exercising as you have been doing. You are doing great. Next low dose CT for Lung Cancer Screening is due 05/2019.  Note your daily symptoms > remember "red flags" for COPD:  Increase in cough, increase in sputum production, increase in shortness of breath or activity intolerance. If you notice these symptoms, please call to be seen.        Magdalen Spatz, NP 09/06/2018  9:58 AM

## 2018-09-06 NOTE — Assessment & Plan Note (Signed)
COPD It is great to see you today. We will add Mucinex 1200 mg once daily for thick secretions. Take with a full glass of water. We will continue Trelegy 1 puff once daily  Rinse mouth after use. We can decide at your follow up if you want to continue taking Trelegy. Continue Rescue inhaler as needed for breakthrough shortness of breath of wheezing. Follow up in 3 months with Dr. Ander Slade Please contact office for sooner follow up if symptoms do not improve or worsen or seek emergency care  Continue exercising as you have been doing. You are doing great. Next low dose CT for Lung Cancer Screening is due 05/2019.  Note your daily symptoms > remember "red flags" for COPD:  Increase in cough, increase in sputum production, increase in shortness of breath or activity intolerance. If you notice these symptoms, please call to be seen.

## 2018-09-07 DIAGNOSIS — K760 Fatty (change of) liver, not elsewhere classified: Secondary | ICD-10-CM | POA: Diagnosis not present

## 2018-09-07 DIAGNOSIS — K869 Disease of pancreas, unspecified: Secondary | ICD-10-CM | POA: Diagnosis not present

## 2018-09-07 DIAGNOSIS — N289 Disorder of kidney and ureter, unspecified: Secondary | ICD-10-CM | POA: Diagnosis not present

## 2018-09-07 NOTE — Telephone Encounter (Signed)
Returned call to patient left vmail Mucinex maximum strength 1200 mg. Nothing further needed Per AVS: It is great to see you today. We will add Mucinex 1200 mg once daily for thick secretions. Take with a full glass of water. We will continue Trelegy 1 puff once daily  Rinse mouth after use. We can decide at your follow up if you want to continue taking Trelegy. Continue Rescue inhaler as needed for breakthrough shortness of breath of wheezing. Follow up in 3 months with Dr. Ander Slade Please contact office for sooner follow up if symptoms do not improve or worsen or seek emergency care  Continue exercising as you have been doing. You are doing great. Next low dose CT for Lung Cancer Screening is due 05/2019.

## 2018-09-10 ENCOUNTER — Ambulatory Visit: Payer: BLUE CROSS/BLUE SHIELD | Admitting: Pulmonary Disease

## 2018-09-24 ENCOUNTER — Other Ambulatory Visit: Payer: Self-pay | Admitting: Family Medicine

## 2018-09-24 DIAGNOSIS — I2510 Atherosclerotic heart disease of native coronary artery without angina pectoris: Secondary | ICD-10-CM

## 2018-09-24 DIAGNOSIS — R030 Elevated blood-pressure reading, without diagnosis of hypertension: Secondary | ICD-10-CM

## 2018-10-05 ENCOUNTER — Other Ambulatory Visit: Payer: Self-pay | Admitting: Pulmonary Disease

## 2018-10-14 DIAGNOSIS — Z713 Dietary counseling and surveillance: Secondary | ICD-10-CM | POA: Diagnosis not present

## 2018-10-19 ENCOUNTER — Encounter: Payer: Self-pay | Admitting: Family Medicine

## 2018-10-19 ENCOUNTER — Telehealth (INDEPENDENT_AMBULATORY_CARE_PROVIDER_SITE_OTHER): Payer: BC Managed Care – PPO | Admitting: Family Medicine

## 2018-10-19 VITALS — BP 159/93 | HR 77

## 2018-10-19 DIAGNOSIS — F513 Sleepwalking [somnambulism]: Secondary | ICD-10-CM

## 2018-10-19 DIAGNOSIS — K219 Gastro-esophageal reflux disease without esophagitis: Secondary | ICD-10-CM | POA: Insufficient documentation

## 2018-10-19 DIAGNOSIS — I10 Essential (primary) hypertension: Secondary | ICD-10-CM | POA: Diagnosis not present

## 2018-10-19 DIAGNOSIS — I2510 Atherosclerotic heart disease of native coronary artery without angina pectoris: Secondary | ICD-10-CM | POA: Diagnosis not present

## 2018-10-19 HISTORY — DX: Essential (primary) hypertension: I10

## 2018-10-19 HISTORY — DX: Gastro-esophageal reflux disease without esophagitis: K21.9

## 2018-10-19 HISTORY — DX: Sleepwalking (somnambulism): F51.3

## 2018-10-19 MED ORDER — METOPROLOL SUCCINATE ER 50 MG PO TB24: 50.0000 mg | ORAL_TABLET | Freq: Every day | ORAL | 0 refills | Status: DC

## 2018-10-19 NOTE — Progress Notes (Signed)
Established Patient Office Visit  Subjective:  Patient ID: Justin Boyd, male    DOB: [birth year]  Age: 68 y.o. MRN: [id number]  CC:  Chief Complaint  Patient presents with  . Follow-up    HPI Justin Boyd presents for follow-up of his hypertension, elevated LDL cholesterol, restless leg syndrome and anxiety.  Patient has been feeling a lot less anxious and decided to stop the Prozac.  He has done well without it.  Symptoms of reflux have returned.  He was under the impression that rosuvastatin would help his reflux and had discontinued the pantoprazole.  Blood pressure off of the metoprolol start returned into the 150s over 90s range.  He has consulted a nutritionist is working to lower his blood pressure and cholesterol via diet therapy as well.  Continues to shelter at home.  He is using nicotine lozenges and continues to be smoke-free.  He reminds me that he has a history of sleepwalking.  He has had this since childhood.  His wife noted recently that it appeared that he had been out in the kitchen one night and did not remember being there.  Past Medical History:  Diagnosis Date  . Chicken pox   . Hyperlipidemia     Past Surgical History:  Procedure Laterality Date  . carpal tunnel both hands  2010  . SHOULDER SURGERY Right 2016    Family History  Problem Relation Age of Onset  . Hypertension Mother   . Stroke Mother     Social History   Socioeconomic History  . Marital status: Married    Spouse name: Not on file  . Number of children: Not on file  . Years of education: Not on file  . Highest education level: Not on file  Occupational History  . Not on file  Social Needs  . Financial resource strain: Not on file  . Food insecurity    Worry: Not on file    Inability: Not on file  . Transportation needs    Medical: Not on file    Non-medical: Not on file  Tobacco Use  . Smoking status: Former Smoker    Packs/day: 1.00    Years: 45.00    Pack years: 45.00     Types: Cigarettes    Quit date: 05/05/2018    Years since quitting: 0.4  . Smokeless tobacco: Never Used  Substance and Sexual Activity  . Alcohol use: Yes  . Drug use: Never  . Sexual activity: Yes    Partners: Female  Lifestyle  . Physical activity    Days per week: Not on file    Minutes per session: Not on file  . Stress: Not on file  Relationships  . Social Herbalist on phone: Not on file    Gets together: Not on file    Attends religious service: Not on file    Active member of club or organization: Not on file    Attends meetings of clubs or organizations: Not on file    Relationship status: Not on file  . Intimate partner violence    Fear of current or ex partner: Not on file    Emotionally abused: Not on file    Physically abused: Not on file    Forced sexual activity: Not on file  Other Topics Concern  . Not on file  Social History Narrative  . Not on file    Outpatient Medications Prior to Visit  Medication Sig Dispense Refill  .  albuterol (PROVENTIL HFA;VENTOLIN HFA) 108 (90 Base) MCG/ACT inhaler Inhale 2 puffs into the lungs every 4 (four) hours as needed for wheezing or shortness of breath. 1 Inhaler 5  . FLUoxetine (PROZAC) 10 MG tablet Take one each morning for one week and then increase to 2 each day. 60 tablet 3  . gabapentin (NEURONTIN) 300 MG capsule Take 1 capsule (300 mg total) by mouth at bedtime. 90 capsule 3  . magnesium citrate SOLN Take 1 Bottle by mouth once.    . pantoprazole (PROTONIX) 40 MG tablet Take 40 mg by mouth daily.    . rosuvastatin (CRESTOR) 10 MG tablet Take 1 tablet (10 mg total) by mouth daily. 90 tablet 1  . TRELEGY ELLIPTA 100-62.5-25 MCG/INH AEPB INHALE 1 PUFF INTO THE LUNGS DAILY 60 each 3   No facility-administered medications prior to visit.     Allergies  Allergen Reactions  . Oxycodone-Acetaminophen Other (See Comments)    Insomnia, so is able to take it Insomnia, so is able to take it   .  Hydrocodone-Acetaminophen Itching    insomnia insomnia   . Penicillins Swelling    Extreme soreness Extreme soreness     ROS Review of Systems  Constitutional: Negative.   Respiratory: Negative.   Cardiovascular: Negative.   Gastrointestinal: Negative.   Neurological: Negative for light-headedness and headaches.  Psychiatric/Behavioral: Positive for sleep disturbance. Negative for dysphoric mood. The patient is not nervous/anxious.       Objective:    Physical Exam  Constitutional: He is oriented to person, place, and time. He appears well-developed and well-nourished. No distress.  HENT:  Head: Normocephalic and atraumatic.  Right Ear: External ear normal.  Left Ear: External ear normal.  Eyes: Right eye exhibits no discharge. Left eye exhibits no discharge. No scleral icterus.  Neck: No tracheal deviation present.  Pulmonary/Chest: Effort normal. No stridor.  Neurological: He is alert and oriented to person, place, and time.  Skin: He is not diaphoretic.  Psychiatric: He has a normal mood and affect. His behavior is normal.    BP (!) 159/93   Pulse 77   SpO2 94%  Wt Readings from Last 3 Encounters:  09/06/18 214 lb (97.1 kg)  07/30/18 204 lb (92.5 kg)  07/01/18 208 lb 2 oz (94.4 kg)   BP Readings from Last 3 Encounters:  10/19/18 (!) 159/93  09/06/18 132/78  07/30/18 135/75   Guideline developer:  UpToDate (see UpToDate for funding source) Date Released: June 2014  There are no preventive care reminders to display for this patient.  There are no preventive care reminders to display for this patient.  Lab Results  Component Value Date   TSH 1.16 03/29/2018   Lab Results  Component Value Date   WBC 5.9 03/29/2018   HGB 16.6 03/29/2018   HCT 49.5 03/29/2018   MCV 90.8 03/29/2018   PLT 152.0 03/29/2018   Lab Results  Component Value Date   NA 141 06/17/2018   K 4.5 06/17/2018   CO2 29 06/17/2018   GLUCOSE 108 (H) 06/17/2018   BUN 28 (H)  06/17/2018   CREATININE 0.98 06/17/2018   BILITOT 0.6 03/29/2018   ALKPHOS 98 03/29/2018   AST 14 03/29/2018   ALT 27 03/29/2018   PROT 6.3 03/29/2018   ALBUMIN 4.0 03/29/2018   CALCIUM 9.4 06/17/2018   GFR 76.61 06/17/2018   Lab Results  Component Value Date   CHOL 211 (H) 03/29/2018   Lab Results  Component Value Date  HDL 34.60 (L) 03/29/2018   No results found for: [facility] Lab Results  Component Value Date   TRIG 211.0 (H) 03/29/2018   Lab Results  Component Value Date   CHOLHDL 6 03/29/2018   No results found for: HGBA1C    Assessment & Plan:   Problem List Items Addressed This Visit      Cardiovascular and Mediastinum   Atherosclerosis of coronary artery - Primary   Relevant Medications   metoprolol succinate (TOPROL-XL) 50 MG 24 hr tablet   Other Relevant Orders   Comprehensive metabolic panel   LDL cholesterol, direct   Lipid panel   Essential hypertension   Relevant Medications   metoprolol succinate (TOPROL-XL) 50 MG 24 hr tablet   Other Relevant Orders   Comprehensive metabolic panel     Digestive   Gastroesophageal reflux disease     Nervous and Auditory   Sleep walking    Virtual Visit via Video Note  I connected with Ranae Palms on 10/19/18 at 10:00 AM EDT by a video enabled telemedicine application and verified that I am speaking with the correct person using two identifiers.  Location: Patient: home Provider:    I discussed the limitations of evaluation and management by telemedicine and the availability of in person appointments. The patient expressed understanding and agreed to proceed.  History of Present Illness:    Observations/Objective:   Assessment and Plan:   Follow Up Instructions:    I discussed the assessment and treatment plan with the patient. The patient was provided an opportunity to ask questions and all were answered. The patient agreed with the plan and demonstrated an understanding of the  instructions.   The patient was advised to call back or seek an in-person evaluation if the symptoms worsen or if the condition fails to improve as anticipated.  I provided 20 minutes of non-face-to-face time during this encounter.   [name], MD   Meds ordered this encounter  Medications  . metoprolol succinate (TOPROL-XL) 50 MG 24 hr tablet    Sig: Take 1 tablet (50 mg total) by mouth daily. Take with or immediately following a meal.    Dispense:  90 tablet    Refill:  0    Follow-up: Return in about 3 months (around 01/19/2019).   We will follow up for labs.  Restart the metoprolol and the pantoprazole.  Stressed the stressed the importance of not smoking while using the nicotine lozenges.  Anticipating increasing his Crestor dosage.  Discussed our goal for his LDL to be as close to 70 as possible.

## 2018-10-20 ENCOUNTER — Telehealth: Payer: Self-pay

## 2018-10-20 NOTE — Telephone Encounter (Signed)
Fever >100.60F []   Yes [x]   No []   Unknown Subjective fever (felt feverish) []   Yes [x]   No []   Unknown Chills []   Yes [x]   No []   Unknown Muscle aches (myalgia) []   Yes [x]   No []   Unknown Runny nose (rhinorrhea) []   Yes [x]   No []   Unknown Sore throat []   Yes [x]   No []   Unknown Cough (new onset or worsening of chronic cough) []   Yes [x]   No []   Unknown Shortness of breath (dyspnea) []   Yes [x]   No []   Unknown Nausea or vomiting []   Yes [x]   No []   Unknown Headache []   Yes [x]   No []   Unknown Abdominal pain  []   Yes [x]   No []   Unknown Diarrhea (?3 loose/looser than normal stools/24hr period) []   Yes [x]   No []   Unknown Other, specify:

## 2018-10-21 ENCOUNTER — Other Ambulatory Visit (INDEPENDENT_AMBULATORY_CARE_PROVIDER_SITE_OTHER): Payer: BC Managed Care – PPO

## 2018-10-21 DIAGNOSIS — I2510 Atherosclerotic heart disease of native coronary artery without angina pectoris: Secondary | ICD-10-CM | POA: Diagnosis not present

## 2018-10-21 LAB — COMPREHENSIVE METABOLIC PANEL
ALT: 38 U/L (ref 0–53)
AST: 17 U/L (ref 0–37)
Albumin: 4.3 g/dL (ref 3.5–5.2)
Alkaline Phosphatase: 89 U/L (ref 39–117)
BUN: 30 mg/dL — ABNORMAL HIGH (ref 6–23)
CO2: 29 mEq/L (ref 19–32)
Calcium: 9.3 mg/dL (ref 8.4–10.5)
Chloride: 103 mEq/L (ref 96–112)
Creatinine, Ser: 1.22 mg/dL (ref 0.40–1.50)
GFR: 59.43 mL/min — ABNORMAL LOW (ref >60.00)
Glucose, Bld: 94 mg/dL (ref 70–99)
Potassium: 4.6 mEq/L (ref 3.5–5.1)
Sodium: 139 mEq/L (ref 135–145)
Total Bilirubin: 0.5 mg/dL (ref 0.2–1.2)
Total Protein: 6.5 g/dL (ref 6.0–8.3)

## 2018-10-21 LAB — LIPID PANEL
Cholesterol: 157 mg/dL (ref 0–200)
HDL: 39.2 mg/dL (ref >39.00)
NonHDL: 118.23
Total CHOL/HDL Ratio: 4
Triglycerides: 279 mg/dL — ABNORMAL HIGH (ref 0.0–149.0)
VLDL: 55.8 mg/dL — ABNORMAL HIGH (ref 0.0–40.0)

## 2018-10-21 LAB — LDL CHOLESTEROL, DIRECT: Direct LDL: 90 mg/dL

## 2018-12-02 DIAGNOSIS — Z713 Dietary counseling and surveillance: Secondary | ICD-10-CM | POA: Diagnosis not present

## 2018-12-13 ENCOUNTER — Other Ambulatory Visit: Payer: Self-pay | Admitting: Family Medicine

## 2018-12-13 DIAGNOSIS — G2581 Restless legs syndrome: Secondary | ICD-10-CM

## 2018-12-23 ENCOUNTER — Ambulatory Visit (INDEPENDENT_AMBULATORY_CARE_PROVIDER_SITE_OTHER): Payer: BC Managed Care – PPO

## 2018-12-23 ENCOUNTER — Ambulatory Visit: Payer: Self-pay | Admitting: *Deleted

## 2018-12-23 ENCOUNTER — Encounter: Payer: Self-pay | Admitting: Family Medicine

## 2018-12-23 ENCOUNTER — Other Ambulatory Visit: Payer: Self-pay

## 2018-12-23 ENCOUNTER — Ambulatory Visit: Payer: BC Managed Care – PPO | Admitting: Family Medicine

## 2018-12-23 VITALS — BP 136/70 | HR 81 | Temp 96.0°F | Ht 64.0 in | Wt 218.0 lb

## 2018-12-23 DIAGNOSIS — Z23 Encounter for immunization: Secondary | ICD-10-CM | POA: Diagnosis not present

## 2018-12-23 DIAGNOSIS — M25531 Pain in right wrist: Secondary | ICD-10-CM

## 2018-12-23 DIAGNOSIS — S63501A Unspecified sprain of right wrist, initial encounter: Secondary | ICD-10-CM

## 2018-12-23 DIAGNOSIS — N183 Chronic kidney disease, stage 3 unspecified: Secondary | ICD-10-CM

## 2018-12-23 DIAGNOSIS — F513 Sleepwalking [somnambulism]: Secondary | ICD-10-CM

## 2018-12-23 DIAGNOSIS — S6991XA Unspecified injury of right wrist, hand and finger(s), initial encounter: Secondary | ICD-10-CM | POA: Diagnosis not present

## 2018-12-23 DIAGNOSIS — M7989 Other specified soft tissue disorders: Secondary | ICD-10-CM | POA: Diagnosis not present

## 2018-12-23 DIAGNOSIS — N1831 Chronic kidney disease, stage 3a: Secondary | ICD-10-CM

## 2018-12-23 HISTORY — DX: Chronic kidney disease, stage 3 unspecified: N18.30

## 2018-12-23 HISTORY — DX: Pain in right wrist: M25.531

## 2018-12-23 HISTORY — DX: Chronic kidney disease, stage 3a: N18.31

## 2018-12-23 LAB — BASIC METABOLIC PANEL
BUN: 26 mg/dL — ABNORMAL HIGH (ref 6–23)
CO2: 24 mEq/L (ref 19–32)
Calcium: 9.5 mg/dL (ref 8.4–10.5)
Chloride: 107 mEq/L (ref 96–112)
Creatinine, Ser: 1.14 mg/dL (ref 0.40–1.50)
GFR: 64.24 mL/min (ref >60.00)
Glucose, Bld: 117 mg/dL — ABNORMAL HIGH (ref 70–99)
Potassium: 4.2 mEq/L (ref 3.5–5.1)
Sodium: 142 mEq/L (ref 135–145)

## 2018-12-23 NOTE — Progress Notes (Addendum)
Established Patient Office Visit  Subjective:  Patient ID: Justin Boyd, male    DOB: 02-Jan-1951  Age: 68 y.o. MRN: [id number]  CC:  Chief Complaint  Patient presents with  . right wrist swollen    fell x a few weeks ago    HPI Justin Boyd presents for right wrist pain for the last 2 weeks.  Patient fell while sleepwalking and caught himself with his right wrist.  He is right-handed.  Wrist remains swollen and painful 2 weeks later.  Patient has lowered his caloric intake over the last month or so and has been able to lose 7 pounds.  Blood pressure at home is running in the less than 120/70 range.  Past Medical History:  Diagnosis Date  . Chicken pox   . Hyperlipidemia     Past Surgical History:  Procedure Laterality Date  . carpal tunnel both hands  2010  . SHOULDER SURGERY Right 2016    Family History  Problem Relation Age of Onset  . Hypertension Mother   . Stroke Mother     Social History   Socioeconomic History  . Marital status: Married    Spouse name: Not on file  . Number of children: Not on file  . Years of education: Not on file  . Highest education level: Not on file  Occupational History  . Not on file  Social Needs  . Financial resource strain: Not on file  . Food insecurity    Worry: Not on file    Inability: Not on file  . Transportation needs    Medical: Not on file    Non-medical: Not on file  Tobacco Use  . Smoking status: Former Smoker    Packs/day: 1.00    Years: 45.00    Pack years: 45.00    Types: Cigarettes    Quit date: 05/05/2018    Years since quitting: 0.6  . Smokeless tobacco: Never Used  Substance and Sexual Activity  . Alcohol use: Yes    Comment: 2 cans of beer a week  . Drug use: Never  . Sexual activity: Yes    Partners: Female  Lifestyle  . Physical activity    Days per week: Not on file    Minutes per session: Not on file  . Stress: Not on file  Relationships  . Social Herbalist on phone: Not  on file    Gets together: Not on file    Attends religious service: Not on file    Active member of club or organization: Not on file    Attends meetings of clubs or organizations: Not on file    Relationship status: Not on file  . Intimate partner violence    Fear of current or ex partner: Not on file    Emotionally abused: Not on file    Physically abused: Not on file    Forced sexual activity: Not on file  Other Topics Concern  . Not on file  Social History Narrative  . Not on file    Outpatient Medications Prior to Visit  Medication Sig Dispense Refill  . albuterol (PROVENTIL HFA;VENTOLIN HFA) 108 (90 Base) MCG/ACT inhaler Inhale 2 puffs into the lungs every 4 (four) hours as needed for wheezing or shortness of breath. 1 Inhaler 5  . FLUoxetine (PROZAC) 10 MG tablet Take one each morning for one week and then increase to 2 each day. 60 tablet 3  . gabapentin (NEURONTIN) 300 MG capsule TAKE  1 CAPSULE(300 MG) BY MOUTH THREE TIMES DAILY 90 capsule 3  . magnesium citrate SOLN Take 1 Bottle by mouth once.    . metoprolol succinate (TOPROL-XL) 50 MG 24 hr tablet Take 1 tablet (50 mg total) by mouth daily. Take with or immediately following a meal. 90 tablet 0  . pantoprazole (PROTONIX) 40 MG tablet Take 40 mg by mouth daily.    . rosuvastatin (CRESTOR) 10 MG tablet Take 1 tablet (10 mg total) by mouth daily. 90 tablet 1  . TRELEGY ELLIPTA 100-62.5-25 MCG/INH AEPB INHALE 1 PUFF INTO THE LUNGS DAILY 60 each 3   No facility-administered medications prior to visit.     Allergies  Allergen Reactions  . Oxycodone-Acetaminophen Other (See Comments)    Insomnia, so is able to take it Insomnia, so is able to take it   . Hydrocodone-Acetaminophen Itching    insomnia insomnia   . Penicillins Swelling    Extreme soreness Extreme soreness     ROS Review of Systems  Constitutional: Negative.   HENT: Negative.   Eyes: Negative for photophobia and visual disturbance.  Respiratory:  Negative for chest tightness and shortness of breath.   Cardiovascular: Negative for chest pain.  Gastrointestinal: Negative.   Genitourinary: Negative for decreased urine volume, difficulty urinating, frequency and urgency.  Musculoskeletal: Positive for arthralgias.  Allergic/Immunologic: Negative for immunocompromised state.  Neurological: Negative for weakness and numbness.  Hematological: Does not bruise/bleed easily.  Psychiatric/Behavioral: Negative.       Objective:    Physical Exam  Constitutional: He is oriented to person, place, and time. He appears well-developed and well-nourished. No distress.  HENT:  Head: Normocephalic and atraumatic.  Right Ear: External ear normal.  Left Ear: External ear normal.  Eyes: Conjunctivae are normal. Right eye exhibits no discharge. Left eye exhibits no discharge. No scleral icterus.  Neck: No JVD present. No tracheal deviation present.  Pulmonary/Chest: Breath sounds normal. No stridor.  Musculoskeletal:     Right wrist: He exhibits decreased range of motion, tenderness, bony tenderness and swelling. He exhibits no effusion.       Arms:  Neurological: He is alert and oriented to person, place, and time.  Skin: Skin is warm and dry. He is not diaphoretic.  Psychiatric: He has a normal mood and affect. His behavior is normal.    BP 136/70   Pulse 81   Temp (!) 96 F (35.6 C) (Temporal)   Ht 5\' 4"  (1.626 m)   Wt 218 lb (98.9 kg)   BMI 37.42 kg/m  Wt Readings from Last 3 Encounters:  12/23/18 218 lb (98.9 kg)  09/06/18 214 lb (97.1 kg)  07/30/18 204 lb (92.5 kg)   BP Readings from Last 3 Encounters:  12/23/18 136/70  10/19/18 (!) 159/93  09/06/18 132/78   Guideline developer:  UpToDate (see UpToDate for funding source) Date Released: June 2014  Health Maintenance Due  Topic Date Due  . Samul Dada  12/06/2018    There are no preventive care reminders to display for this patient.  Lab Results  Component Value Date    TSH 1.16 03/29/2018   Lab Results  Component Value Date   WBC 5.9 03/29/2018   HGB 16.6 03/29/2018   HCT 49.5 03/29/2018   MCV 90.8 03/29/2018   PLT 152.0 03/29/2018   Lab Results  Component Value Date   NA 142 12/23/2018   K 4.2 12/23/2018   CO2 24 12/23/2018   GLUCOSE 117 (H) 12/23/2018   BUN 26 (H)  12/23/2018   CREATININE 1.14 12/23/2018   BILITOT 0.5 10/21/2018   ALKPHOS 89 10/21/2018   AST 17 10/21/2018   ALT 38 10/21/2018   PROT 6.5 10/21/2018   ALBUMIN 4.3 10/21/2018   CALCIUM 9.5 12/23/2018   GFR 64.24 12/23/2018   Lab Results  Component Value Date   CHOL 157 10/21/2018   Lab Results  Component Value Date   HDL 39.20 10/21/2018   No results found for: [facility] Lab Results  Component Value Date   TRIG 279.0 (H) 10/21/2018   Lab Results  Component Value Date   CHOLHDL 4 10/21/2018   No results found for: HGBA1C    Assessment & Plan:   Problem List Items Addressed This Visit      Nervous and Auditory   Sleep walking     Genitourinary   CKD (chronic kidney disease), stage III ([place])   Relevant Orders   Basic metabolic panel (Completed)     Other   Wrist pain, acute, right - Primary   Relevant Orders   DG Wrist Complete Right (Completed)    Other Visit Diagnoses    Need for immunization against influenza       Relevant Orders   Flu Vaccine QUAD High Dose(Fluad) (Completed)   Sprain of right wrist, initial encounter       Relevant Orders   Ambulatory referral to Sports Medicine      No orders of the defined types were placed in this encounter.   Follow-up: Return in about 3 months (around 03/25/2019).   Did not see a fracture on today's x-ray.  We will go ahead and send a sports medicine for follow-up of an apparent wrist sprain.  Will discuss increasing the rosuvastatin at the next OV.

## 2018-12-23 NOTE — Telephone Encounter (Signed)
FYI - pt has appointment at 2pm this afternoon.

## 2018-12-23 NOTE — Telephone Encounter (Signed)
  I returned pt's call.   He fell 2 weeks ago and hit his right wrist against a door trying to catch himself.    Denies swelling or bruising but feeling a sharp pain when he moves his wrist from side to side.  Also having trouble using his computer mouse and cutting up food.  He has not had this examined since the fall.  I warm transferred his call to [facility] in Dr. Bebe Shaggy office to be scheduled.  I sent my triage notes to the office.  Reason for Disposition . [1] MODERATE pain (e.g., interferes with normal activities) AND [2] present > 3 days    Fell 2 weeks ago and hit his right wrist against the door trying to catch himself.  Answer Assessment - Initial Assessment Questions 1. ONSET: "When did the swelling start?" (e.g., minutes, hours, days, weeks)     I fell backwards and hit a door and I hit my wrist on the door.   Happened 2 weeks ago.    I get sharp pains In my wrist when I move it.   I have a hard time using my mouse.    2. LOCATION: "What part of the wrist is swollen?"  "Are both wrists swollen or just one wrist?"     Right wrist    No swelling or bruising.    I can make a fist.    It hurts when move wrist side to side. 3. SEVERITY: "How bad is the swelling?"    - BALL OR LUMP: small ball or lump   - SKIN ONLY: localized; puffy or swollen area or patch of skin   - MILD JOINT SWELLING: joint feels or looks mildly swollen or puffy   - MODERATE JOINT SWELLING: moderate joint swelling; looks swollen   - SEVERE JOINT SWELLING:  severe joint swelling; can barely bend or move joint     It hurts bad to move my wrist from side to side 4. RECURRENT SYMPTOM: "Have you had wrist swelling before?" If so, ask: "When was the last time?" "What happened that time?"     No 5. CAUSE: "What do you think is causing the wrist swelling?" (e.g., arthritis, ganglion cyst, insect bite, recent injury)     I fell 2 weeks ago and hit it on the door. 6. OTHER SYMPTOMS: "Do you have any other symptoms?" (e.g.,  fever, hand pain)     No 7. PREGNANCY: "Is there any chance you are pregnant?" "When was your last menstrual period?"     N/A  Protocols used: WRIST [employer]

## 2018-12-27 ENCOUNTER — Ambulatory Visit: Payer: Self-pay

## 2018-12-27 ENCOUNTER — Ambulatory Visit: Payer: BC Managed Care – PPO | Admitting: Family Medicine

## 2018-12-27 ENCOUNTER — Other Ambulatory Visit: Payer: Self-pay

## 2018-12-27 ENCOUNTER — Encounter: Payer: Self-pay | Admitting: Family Medicine

## 2018-12-27 VITALS — BP 140/82 | Ht 64.0 in | Wt 218.0 lb

## 2018-12-27 DIAGNOSIS — M25531 Pain in right wrist: Secondary | ICD-10-CM

## 2018-12-27 NOTE — Progress Notes (Signed)
Justin Boyd - 68 y.o. male MRN [id number]  Date of birth: [birth year]  SUBJECTIVE:  Including CC & ROS.  Chief Complaint  Patient presents with  . Wrist Pain    right wrist x 2 weeks    Justin Boyd is a 68 y.o. male that is presenting with right wrist pain.  The pain is occurring on the dorsal component of the radial aspect.  He was sleepwalking a few weeks ago and fell backwards and caught himself on the wrist.  Since that time he has had pain with extension as well as trying to pick things up.  He is felt some weakness when lifting.  Denies any prior history of wrist problems.  Pain seems to be staying the same.  Has not had any improvement with modalities to date.  It seems to be localized to the wrist..  Independent review of the right wrist x-ray from 8/20 shows no acute abnormality.   Review of Systems  Constitutional: Negative for fever.  HENT: Negative for congestion.   Respiratory: Negative for cough.   Cardiovascular: Negative for chest pain.  Gastrointestinal: Negative for abdominal pain.  Musculoskeletal: Positive for arthralgias.  Skin: Negative for color change.  Neurological: Negative for weakness.  Hematological: Negative for adenopathy.    HISTORY: Past Medical, Surgical, Social, and Family History Reviewed & Updated per EMR.   Pertinent Historical Findings include:  Past Medical History:  Diagnosis Date  . Chicken pox   . Hyperlipidemia     Past Surgical History:  Procedure Laterality Date  . carpal tunnel both hands  2010  . SHOULDER SURGERY Right 2016    Allergies  Allergen Reactions  . Oxycodone-Acetaminophen Other (See Comments)    Insomnia, so is able to take it Insomnia, so is able to take it   . Hydrocodone-Acetaminophen Itching    insomnia insomnia   . Penicillins Swelling    Extreme soreness Extreme soreness     Family History  Problem Relation Age of Onset  . Hypertension Mother   . Stroke Mother      Social History    Socioeconomic History  . Marital status: Married    Spouse name: Not on file  . Number of children: Not on file  . Years of education: Not on file  . Highest education level: Not on file  Occupational History  . Not on file  Social Needs  . Financial resource strain: Not on file  . Food insecurity    Worry: Not on file    Inability: Not on file  . Transportation needs    Medical: Not on file    Non-medical: Not on file  Tobacco Use  . Smoking status: Former Smoker    Packs/day: 1.00    Years: 45.00    Pack years: 45.00    Types: Cigarettes    Quit date: 05/05/2018    Years since quitting: 0.6  . Smokeless tobacco: Never Used  Substance and Sexual Activity  . Alcohol use: Yes    Comment: 2 cans of beer a week  . Drug use: Never  . Sexual activity: Yes    Partners: Female  Lifestyle  . Physical activity    Days per week: Not on file    Minutes per session: Not on file  . Stress: Not on file  Relationships  . Social Herbalist on phone: Not on file    Gets together: Not on file    Attends religious service: Not on  file    Active member of club or organization: Not on file    Attends meetings of clubs or organizations: Not on file    Relationship status: Not on file  . Intimate partner violence    Fear of current or ex partner: Not on file    Emotionally abused: Not on file    Physically abused: Not on file    Forced sexual activity: Not on file  Other Topics Concern  . Not on file  Social History Narrative  . Not on file     PHYSICAL EXAM:  VS: BP 140/82   Ht 5\' 4"  (1.626 m)   Wt 218 lb (98.9 kg)   BMI 37.42 kg/m  Physical Exam Gen: NAD, alert, cooperative with exam, well-appearing ENT: normal lips, normal nasal mucosa,  Eye: normal EOM, normal conjunctiva and lids CV:  no edema, +2 pedal pulses   Resp: no accessory muscle use, non-labored,   Skin: no rashes, no areas of induration  Neuro: normal tone, normal sensation to touch Psych:   normal insight, alert and oriented MSK:  Right wrist: Normal range of motion. No specific area of tenderness over the snuffbox. No clunk appreciated. No tenderness palpation of the TFCC. Normal thumb range of motion. No tenderness palpation of the CMC joint. Negative Finkelstein's test. Neurovascular intact  Limited ultrasound: Right wrist:  No significant degenerative changes of the CMC joint. Normal-appearing first dorsal compartment. Second dorsal compartment tendons appear to be intact but there is a hypoechoic change superficial to the tendons as it courses over the distal radius.  It appears that there may be a ruptured retinaculum in this area. No changes such as effusions noted in the deep wrist joint.  Summary: Findings suggest retinacular tear of the second dorsal compartment  Ultrasound and interpretation by Clearance Coots, MD      ASSESSMENT & PLAN:   Wrist pain, acute, right Symptoms on clinical exam are nonspecific.  Less likely for scapholunate tear.  Ultrasound suggested some changes over the second dorsal compartment but not in the tendons and cells.  It may be the retinaculum in this area has become ruptured. -Placed in a brace. -Counseled on home exercise therapy and supportive care. -If no improvement can consider physical therapy or further imaging.

## 2018-12-27 NOTE — Assessment & Plan Note (Signed)
Symptoms on clinical exam are nonspecific.  Less likely for scapholunate tear.  Ultrasound suggested some changes over the second dorsal compartment but not in the tendons and cells.  It may be the retinaculum in this area has become ruptured. -Placed in a brace. -Counseled on home exercise therapy and supportive care. -If no improvement can consider physical therapy or further imaging.

## 2018-12-27 NOTE — Patient Instructions (Signed)
Nice to meet you Please try the brace if you are doing anything active  Please try the range of motion exercises  Please try ice  Please send me a message in MyChart with any questions or updates.  Please see me back in 3-4 weeks.   --Dr. Raeford Razor

## 2018-12-30 ENCOUNTER — Encounter: Payer: Self-pay | Admitting: Family Medicine

## 2018-12-30 ENCOUNTER — Ambulatory Visit: Payer: BC Managed Care – PPO | Admitting: Family Medicine

## 2018-12-30 ENCOUNTER — Other Ambulatory Visit: Payer: Self-pay

## 2018-12-30 VITALS — BP 118/70 | HR 71 | Ht 64.0 in | Wt 218.0 lb

## 2018-12-30 DIAGNOSIS — Z23 Encounter for immunization: Secondary | ICD-10-CM

## 2018-12-30 DIAGNOSIS — I2510 Atherosclerotic heart disease of native coronary artery without angina pectoris: Secondary | ICD-10-CM

## 2018-12-30 DIAGNOSIS — F513 Sleepwalking [somnambulism]: Secondary | ICD-10-CM | POA: Diagnosis not present

## 2018-12-30 DIAGNOSIS — I10 Essential (primary) hypertension: Secondary | ICD-10-CM

## 2018-12-30 DIAGNOSIS — E7800 Pure hypercholesterolemia, unspecified: Secondary | ICD-10-CM

## 2018-12-30 DIAGNOSIS — S63501A Unspecified sprain of right wrist, initial encounter: Secondary | ICD-10-CM

## 2018-12-30 DIAGNOSIS — Z713 Dietary counseling and surveillance: Secondary | ICD-10-CM | POA: Diagnosis not present

## 2018-12-30 HISTORY — DX: Atherosclerotic heart disease of native coronary artery without angina pectoris: I25.10

## 2018-12-30 HISTORY — DX: Unspecified sprain of right wrist, initial encounter: S63.501A

## 2018-12-30 LAB — CBC
HCT: 43 % (ref 39.0–52.0)
Hemoglobin: 14.5 g/dL (ref 13.0–17.0)
MCHC: 33.7 g/dL (ref 30.0–36.0)
MCV: 90 fl (ref 78.0–100.0)
Platelets: 171 10*3/uL (ref 150.0–400.0)
RBC: 4.77 Mil/uL (ref 4.22–5.81)
RDW: 13.7 % (ref 11.5–15.5)
WBC: 6.7 10*3/uL (ref 4.0–10.5)

## 2018-12-30 LAB — LDL CHOLESTEROL, DIRECT: Direct LDL: 78 mg/dL

## 2018-12-30 NOTE — Progress Notes (Signed)
Established Patient Office Visit  Subjective:  Patient ID: Justin Boyd, male    DOB: [birth year]  Age: 68 y.o. MRN: [id number]  CC:  Chief Complaint  Patient presents with  . Follow-up    HPI Justin Boyd presents for follow-up of his blood pressure and elevated LDL cholesterol.  Blood pressure is been well controlled with the metoprolol.  He has had no chest pain or chest tightness associated with this medication.  He is tolerating rosuvastatin.  GFR increased with the blood work drawn a few weeks ago.  He is remaining well-hydrated.  Continues to be active about his garden.  His wrist is improving.  There is decreased pain and swelling.  He is using his wrist splint when working around his house and garden.  Past Medical History:  Diagnosis Date  . Chicken pox   . Hyperlipidemia     Past Surgical History:  Procedure Laterality Date  . carpal tunnel both hands  2010  . SHOULDER SURGERY Right 2016    Family History  Problem Relation Age of Onset  . Hypertension Mother   . Stroke Mother     Social History   Socioeconomic History  . Marital status: Married    Spouse name: Not on file  . Number of children: Not on file  . Years of education: Not on file  . Highest education level: Not on file  Occupational History  . Not on file  Social Needs  . Financial resource strain: Not on file  . Food insecurity    Worry: Not on file    Inability: Not on file  . Transportation needs    Medical: Not on file    Non-medical: Not on file  Tobacco Use  . Smoking status: Former Smoker    Packs/day: 1.00    Years: 45.00    Pack years: 45.00    Types: Cigarettes    Quit date: 05/05/2018    Years since quitting: 0.6  . Smokeless tobacco: Never Used  Substance and Sexual Activity  . Alcohol use: Yes    Comment: 2 cans of beer a week  . Drug use: Never  . Sexual activity: Yes    Partners: Female  Lifestyle  . Physical activity    Days per week: Not on file    Minutes  per session: Not on file  . Stress: Not on file  Relationships  . Social Herbalist on phone: Not on file    Gets together: Not on file    Attends religious service: Not on file    Active member of club or organization: Not on file    Attends meetings of clubs or organizations: Not on file    Relationship status: Not on file  . Intimate partner violence    Fear of current or ex partner: Not on file    Emotionally abused: Not on file    Physically abused: Not on file    Forced sexual activity: Not on file  Other Topics Concern  . Not on file  Social History Narrative  . Not on file    Outpatient Medications Prior to Visit  Medication Sig Dispense Refill  . albuterol (PROVENTIL HFA;VENTOLIN HFA) 108 (90 Base) MCG/ACT inhaler Inhale 2 puffs into the lungs every 4 (four) hours as needed for wheezing or shortness of breath. 1 Inhaler 5  . gabapentin (NEURONTIN) 300 MG capsule TAKE 1 CAPSULE(300 MG) BY MOUTH THREE TIMES DAILY 90 capsule 3  .  metoprolol succinate (TOPROL-XL) 50 MG 24 hr tablet Take 1 tablet (50 mg total) by mouth daily. Take with or immediately following a meal. 90 tablet 0  . pantoprazole (PROTONIX) 40 MG tablet Take 40 mg by mouth daily.    . rosuvastatin (CRESTOR) 10 MG tablet Take 1 tablet (10 mg total) by mouth daily. 90 tablet 1  . TRELEGY ELLIPTA 100-62.5-25 MCG/INH AEPB INHALE 1 PUFF INTO THE LUNGS DAILY 60 each 3  . FLUoxetine (PROZAC) 10 MG tablet Take one each morning for one week and then increase to 2 each day. 60 tablet 3  . magnesium citrate SOLN Take 1 Bottle by mouth once.     No facility-administered medications prior to visit.     Allergies  Allergen Reactions  . Oxycodone-Acetaminophen Other (See Comments)    Insomnia, so is able to take it Insomnia, so is able to take it   . Hydrocodone-Acetaminophen Itching    insomnia insomnia   . Penicillins Swelling    Extreme soreness Extreme soreness     ROS Review of Systems   Constitutional: Negative.   Respiratory: Negative.   Cardiovascular: Negative.   Gastrointestinal: Negative.   Endocrine: Negative for polyphagia and polyuria.  Genitourinary: Negative.   Musculoskeletal: Positive for arthralgias and joint swelling.  Neurological: Negative for seizures and speech difficulty.  Psychiatric/Behavioral: Negative.       Objective:    Physical Exam  Constitutional: He is oriented to person, place, and time. He appears well-developed and well-nourished. No distress.  HENT:  Head: Normocephalic and atraumatic.  Right Ear: External ear normal.  Left Ear: External ear normal.  Eyes: Conjunctivae are normal. Right eye exhibits no discharge. Left eye exhibits no discharge. No scleral icterus.  Neck: No JVD present. No tracheal deviation present. No thyromegaly present.  Cardiovascular: Normal rate, regular rhythm and normal heart sounds.  Pulmonary/Chest: Effort normal and breath sounds normal. No stridor.  Musculoskeletal:     Right wrist: He exhibits swelling. He exhibits no tenderness.  Lymphadenopathy:    He has no cervical adenopathy.  Neurological: He is alert and oriented to person, place, and time.  Skin: Skin is warm and dry. He is not diaphoretic.  Psychiatric: He has a normal mood and affect. His behavior is normal.    BP 118/70   Pulse 71   Ht 5\' 4"  (1.626 m)   Wt 218 lb (98.9 kg)   SpO2 91%   BMI 37.42 kg/m  Wt Readings from Last 3 Encounters:  12/30/18 218 lb (98.9 kg)  12/27/18 218 lb (98.9 kg)  12/23/18 218 lb (98.9 kg)   BP Readings from Last 3 Encounters:  12/30/18 118/70  12/27/18 140/82  12/23/18 136/70   Guideline developer:  UpToDate (see UpToDate for funding source) Date Released: June 2014  There are no preventive care reminders to display for this patient.  There are no preventive care reminders to display for this patient.  Lab Results  Component Value Date   TSH 1.16 03/29/2018   Lab Results  Component  Value Date   WBC 5.9 03/29/2018   HGB 16.6 03/29/2018   HCT 49.5 03/29/2018   MCV 90.8 03/29/2018   PLT 152.0 03/29/2018   Lab Results  Component Value Date   NA 142 12/23/2018   K 4.2 12/23/2018   CO2 24 12/23/2018   GLUCOSE 117 (H) 12/23/2018   BUN 26 (H) 12/23/2018   CREATININE 1.14 12/23/2018   BILITOT 0.5 10/21/2018   ALKPHOS 89 10/21/2018  AST 17 10/21/2018   ALT 38 10/21/2018   PROT 6.5 10/21/2018   ALBUMIN 4.3 10/21/2018   CALCIUM 9.5 12/23/2018   GFR 64.24 12/23/2018   Lab Results  Component Value Date   CHOL 157 10/21/2018   Lab Results  Component Value Date   HDL 39.20 10/21/2018   No results found for: [facility] Lab Results  Component Value Date   TRIG 279.0 (H) 10/21/2018   Lab Results  Component Value Date   CHOLHDL 4 10/21/2018   No results found for: HGBA1C    Assessment & Plan:   Problem List Items Addressed This Visit      Cardiovascular and Mediastinum   Essential hypertension - Primary   Relevant Orders   CBC   Urinalysis, Routine w reflex microscopic     Nervous and Auditory   Sleep walking   Relevant Orders   Urinalysis, Routine w reflex microscopic     Musculoskeletal and Integument   Sprain of right wrist     Other   Need for Tdap vaccination   Relevant Orders   Tdap vaccine greater than or equal to 7yo IM (Completed)   Elevated LDL cholesterol level   Relevant Orders   LDL cholesterol, direct   Atherosclerosis of native coronary artery of native heart without angina pectoris   Relevant Orders   LDL cholesterol, direct      No orders of the defined types were placed in this encounter.   Follow-up: Return in about 3 months (around 04/01/2019).   Encouraged him to wear his splint even though it may be difficult to do when he is engaged in heavier labor.  Discussed an LDL goal of 70 or better with his history of ASCVD.  May need to increase his rosuvastatin.

## 2019-01-09 NOTE — Progress Notes (Signed)
Office Visit    Patient Name: Justin Boyd Date of Encounter: 01/13/2019  Primary Care Provider:  [name], MD Primary Cardiologist:  Jenean Lindau, MD  Chief Complaint    68 year old male with PMH HTN, HLD, coronary artery calcification seen on CT scan presents today for 5-month follow-up of cardiac conditions.  Past Medical History    Past Medical History:  Diagnosis Date  . Chicken pox   . Hyperlipidemia    Past Surgical History:  Procedure Laterality Date  . carpal tunnel both hands  2010  . SHOULDER SURGERY Right 2016    Allergies  Allergies  Allergen Reactions  . Oxycodone-Acetaminophen Other (See Comments)    Insomnia, so is able to take it Insomnia, so is able to take it   . Hydrocodone-Acetaminophen Itching    insomnia insomnia   . Penicillins Swelling    Extreme soreness Extreme soreness     History of Present Illness    Justin Boyd is a 68 y.o. male with a hx of HTN, HLD, emphysema, COPD last seen by Dr. Geraldo Pitter 05/2018.  He presents today for 57-month follow-up.  He reports feeling well. Has been working on projects aruond the house while at home due to coronavirus. Reports his breathing is about at his baseline. Anticipates upcoming appointment with pulmonology.  Checks blood pressure at home. Tells me values fluctuate and are normally around 115/70. Systolic BP range [id number]. Wears a smart watch and resting heart rate is 63 bpm.   Tells me he had a "panic attack" the other night. He was sleeping and woke up and couldn't breath. Took about ten minutes of deep breathing to calm himself down. He sleeps in the bed with 1-2 pillows, denies orthopnea. Echocardiogram 05/2018 with no evidence of heart failure, reviewed with him in detail.   He has been doing Noom with his wife to try to lose weight. He is very pleased that his LDL is down to 78 when checked in August. He has been exercising by working in the yard and tells me he  feels better when he increases his level of activity. His efforts towards healthy diet and exercise were encouraged.   He denies chest pain, edema, palpitations, syncope, lightheadedness.   EKGs/Labs/Other Studies Reviewed:   The following studies were reviewed today: 05/27/2018 echocardiogram - Left ventricle: The cavity size was normal. Wall thickness was   increased in a pattern of mild LVH. Systolic function was normal.   The estimated ejection fraction was in the range of 55% to 60%.   Wall motion was normal; there were no regional wall motion   abnormalities. Doppler parameters are consistent with abnormal  left ventricular relaxation (grade 1 diastolic dysfunction).  06/22/2018 Lexiscan  The left ventricular ejection fraction is hyperdynamic (>65%).  Nuclear stress EF: 70%.  There was no ST segment deviation noted during stress.  The study is normal.  This is a low risk study.  One ventricular triplet seen in recovery.   EKG:  EKG is ordered today.  The ekg ordered today demonstrates sinus bradycardia with no acute ST/T wave changes.   Recent Labs: 03/29/2018: TSH 1.16 10/21/2018: ALT 38 12/23/2018: BUN 26; Creatinine, Ser 1.14; Potassium 4.2; Sodium 142 12/30/2018: Hemoglobin 14.5; Platelets 171.0   Recent Lipid Panel    Component Value Date/Time   CHOL 157 10/21/2018 0812   TRIG 279.0 (H) 10/21/2018 0812   HDL 39.20 10/21/2018 0812   CHOLHDL 4 10/21/2018 0812   VLDL 55.8 (  H) 10/21/2018 0812   LDLDIRECT 78.0 12/30/2018 1025    Home Medications   Current Meds  Medication Sig  . albuterol (PROVENTIL HFA;VENTOLIN HFA) 108 (90 Base) MCG/ACT inhaler Inhale 2 puffs into the lungs every 4 (four) hours as needed for wheezing or shortness of breath.  . gabapentin (NEURONTIN) 300 MG capsule TAKE 1 CAPSULE(300 MG) BY MOUTH THREE TIMES DAILY  . metoprolol succinate (TOPROL-XL) 50 MG 24 hr tablet Take 1 tablet (50 mg total) by mouth daily. Take with or immediately following  a meal.  . pantoprazole (PROTONIX) 40 MG tablet Take 40 mg by mouth daily.  . rosuvastatin (CRESTOR) 10 MG tablet Take 1 tablet (10 mg total) by mouth daily.  . TRELEGY ELLIPTA 100-62.5-25 MCG/INH AEPB INHALE 1 PUFF INTO THE LUNGS DAILY      Review of Systems      Review of Systems  Constitution: Negative for chills, fever and malaise/fatigue.  Cardiovascular: Negative for chest pain, dyspnea on exertion, irregular heartbeat, leg swelling, near-syncope and palpitations.  Respiratory: Negative for cough, shortness of breath and wheezing.   Gastrointestinal: Negative for nausea and vomiting.  Neurological: Negative for dizziness, light-headedness and weakness.   All other systems reviewed and are otherwise negative except as noted above.  Physical Exam    VS:  BP 128/72   Pulse (!) 59   Ht 5\' 4"  (1.626 m)   Wt 216 lb 1.9 oz (98 kg)   BMI 37.10 kg/m  , BMI Body mass index is 37.1 kg/m. GEN: Well nourished, well developed, in no acute distress. HEENT: normal. Neck: Supple, no JVD, carotid bruits, or masses. Cardiac: RRR, no murmurs, rubs, or gallops. No clubbing, cyanosis, edema.  Radials/DP/PT 2+ and equal bilaterally.  Respiratory:  Respirations regular and unlabored, clear to auscultation bilaterally. GI: Soft, nontender, nondistended, BS + x 4. MS: No deformity or atrophy. Skin: Warm and dry, no rash. Neuro:  Strength and sensation are intact. Psych: Normal affect.  Accessory Clinical Findings    ECG personally reviewed by me today - sinus bradycardia with no acute ST/T wave changes - no acute changes.  Assessment & Plan    1. Atherosclerosis of native coronary artery without angina - CT 05/12/2018 with noted aortic atherosclerosis and coronary artery calcification.  Risk factors include age, history of tobacco abuse, HTN.  Present risk factor modification includes aspirin, beta-blocker, and high intensity statin. Will send prescription for PRN nitroglycerin.   2. HTN - BP  well controlled. He checks at home with SBP consistently 110s-120s. Continue present regimen of Metoprolol.   3. HLD - Follows closely with his PCP Dr. Ethelene Hal.  Lipid profile 10/21/2018 with cholesterol 157, LDL 90, HDL 39, triglycerides 279. LDL on 12/30/18 was 78. He and his wife have made significant changes to their diet by utilizing Noom - encouraged him to continue his heart healthy diet.   Disposition: Follow up in 6 months with Dr. Geraldo Pitter.   Loel Dubonnet, NP 01/13/2019, 8:21 AM

## 2019-01-13 ENCOUNTER — Other Ambulatory Visit: Payer: Self-pay

## 2019-01-13 ENCOUNTER — Encounter: Payer: Self-pay | Admitting: Family

## 2019-01-13 ENCOUNTER — Ambulatory Visit (INDEPENDENT_AMBULATORY_CARE_PROVIDER_SITE_OTHER): Payer: BC Managed Care – PPO | Admitting: Family

## 2019-01-13 VITALS — BP 128/72 | HR 59 | Temp 93.6°F | Ht 64.0 in | Wt 216.1 lb

## 2019-01-13 DIAGNOSIS — I2510 Atherosclerotic heart disease of native coronary artery without angina pectoris: Secondary | ICD-10-CM | POA: Diagnosis not present

## 2019-01-13 DIAGNOSIS — E782 Mixed hyperlipidemia: Secondary | ICD-10-CM | POA: Diagnosis not present

## 2019-01-13 DIAGNOSIS — I10 Essential (primary) hypertension: Secondary | ICD-10-CM

## 2019-01-13 MED ORDER — NITROGLYCERIN 0.4 MG SL SUBL: 0.4000 mg | SUBLINGUAL_TABLET | SUBLINGUAL | 6 refills | Status: DC | PRN

## 2019-01-13 NOTE — Patient Instructions (Addendum)
Medication Instructions:  Your physician has recommended you make the following change in your medication:   START Nitroglycerin as needed for chest pain  If you need a refill on your cardiac medications before your next appointment, please call your pharmacy.   Lab work: No lab work today.   If you have labs (blood work) drawn today and your tests are completely normal, you will receive your results only by: Marland Kitchen MyChart Message (if you have MyChart) OR . A paper copy in the mail If you have any lab test that is abnormal or we need to change your treatment, we will call you to review the results.  Testing/Procedures: You had an EKG today.  Follow-Up: At [facility], you and your health needs are our priority.  As part of our continuing mission to provide you with exceptional heart care, we have created designated Provider Care Teams.  These Care Teams include your primary Cardiologist (physician) and Advanced Practice Providers (APPs -  Physician Assistants and Nurse Practitioners) who all work together to provide you with the care you need, when you need it. You will need a follow up appointment in 6 months.  Please call our office 2 months in advance to schedule this appointment.  You may see Jenean Lindau, MD or another member of our [place] Provider Team in [place]: Jenne Campus, MD . Shirlee More, MD . Berniece Salines, MD . Laurann Montana, NP  Any Other Special Instructions Will Be Listed Below (If Applicable).  Keep up the good work with your diet and exercise!  Nitroglycerin sublingual tablets What is this medicine? NITROGLYCERIN (nye troe GLI ser in) is a type of vasodilator. It relaxes blood vessels, increasing the blood and oxygen supply to your heart. This medicine is used to relieve chest pain caused by angina. It is also used to prevent chest pain before activities like climbing stairs, going outdoors in cold weather, or sexual activity. This medicine may  be used for other purposes; ask your health care provider or pharmacist if you have questions. COMMON BRAND NAME(S): Nitroquick, Nitrostat, Nitrotab What should I tell my health care provider before I take this medicine? They need to know if you have any of these conditions:  anemia  head injury, recent stroke, or bleeding in the brain  liver disease  previous heart attack  an unusual or allergic reaction to nitroglycerin, other medicines, foods, dyes, or preservatives  pregnant or trying to get pregnant  breast-feeding How should I use this medicine? Take this medicine by mouth as needed. At the first sign of an angina attack (chest pain or tightness) place one tablet under your tongue. You can also take this medicine 5 to 10 minutes before an event likely to produce chest pain. Follow the directions on the prescription label. Let the tablet dissolve under the tongue. Do not swallow whole. Replace the dose if you accidentally swallow it. It will help if your mouth is not dry. Saliva around the tablet will help it to dissolve more quickly. Do not eat or drink, smoke or chew tobacco while a tablet is dissolving. If you are not better within 5 minutes after taking ONE dose of nitroglycerin, call 9-1-1 immediately to seek emergency medical care. Do not take more than 3 nitroglycerin tablets over 15 minutes. If you take this medicine often to relieve symptoms of angina, your doctor or health care professional may provide you with different instructions to manage your symptoms. If symptoms do not go  away after following these instructions, it is important to call 9-1-1 immediately. Do not take more than 3 nitroglycerin tablets over 15 minutes. Talk to your pediatrician regarding the use of this medicine in children. Special care may be needed. Overdosage: If you think you have taken too much of this medicine contact a poison control center or emergency room at once. NOTE: This medicine is only for  you. Do not share this medicine with others. What if I miss a dose? This does not apply. This medicine is only used as needed. What may interact with this medicine? Do not take this medicine with any of the following medications:  certain migraine medicines like ergotamine and dihydroergotamine (DHE)  medicines used to treat erectile dysfunction like sildenafil, tadalafil, and vardenafil  riociguat This medicine may also interact with the following medications:  alteplase  aspirin  heparin  medicines for high blood pressure  medicines for mental depression  other medicines used to treat angina  phenothiazines like chlorpromazine, mesoridazine, prochlorperazine, thioridazine This list may not describe all possible interactions. Give your health care provider a list of all the medicines, herbs, non-prescription drugs, or dietary supplements you use. Also tell them if you smoke, drink alcohol, or use illegal drugs. Some items may interact with your medicine. What should I watch for while using this medicine? Tell your doctor or health care professional if you feel your medicine is no longer working. Keep this medicine with you at all times. Sit or lie down when you take your medicine to prevent falling if you feel dizzy or faint after using it. Try to remain calm. This will help you to feel better faster. If you feel dizzy, take several deep breaths and lie down with your feet propped up, or bend forward with your head resting between your knees. You may get drowsy or dizzy. Do not drive, use machinery, or do anything that needs mental alertness until you know how this drug affects you. Do not stand or sit up quickly, especially if you are an older patient. This reduces the risk of dizzy or fainting spells. Alcohol can make you more drowsy and dizzy. Avoid alcoholic drinks. Do not treat yourself for coughs, colds, or pain while you are taking this medicine without asking your doctor or  health care professional for advice. Some ingredients may increase your blood pressure. What side effects may I notice from receiving this medicine? Side effects that you should report to your doctor or health care professional as soon as possible:  blurred vision  dry mouth  skin rash  sweating  the feeling of extreme pressure in the head  unusually weak or tired Side effects that usually do not require medical attention (report to your doctor or health care professional if they continue or are bothersome):  flushing of the face or neck  headache  irregular heartbeat, palpitations  nausea, vomiting This list may not describe all possible side effects. Call your doctor for medical advice about side effects. You may report side effects to FDA at 1-800-FDA-1088. Where should I keep my medicine? Keep out of the reach of children. Store at room temperature between 20 and 25 degrees C (68 and 77 degrees F). Store in [employer] Staff. Protect from light and moisture. Keep tightly closed. Throw away any unused medicine after the expiration date. NOTE: This sheet is a summary. It may not cover all possible information. If you have questions about this medicine, talk to your doctor, pharmacist, or health care  provider.  2020 Elsevier/Gold Standard (2013-02-17 17:57:36)

## 2019-01-17 ENCOUNTER — Other Ambulatory Visit: Payer: Self-pay

## 2019-01-17 ENCOUNTER — Ambulatory Visit: Payer: BC Managed Care – PPO | Admitting: Family Medicine

## 2019-01-17 ENCOUNTER — Encounter: Payer: Self-pay | Admitting: Family Medicine

## 2019-01-17 DIAGNOSIS — M25531 Pain in right wrist: Secondary | ICD-10-CM

## 2019-01-17 NOTE — Assessment & Plan Note (Signed)
Has had improvement of his symptoms.  No mechanical symptoms or numbness or tingling. -Counseled on supportive care. -Can follow-up as needed.

## 2019-01-17 NOTE — Progress Notes (Signed)
Justin Boyd - 68 y.o. male MRN ID:[id number]  Date of birth: [birth year]  SUBJECTIVE:  Including CC & ROS.  Chief Complaint  Patient presents with  . Follow-up    follow up for right wrist    Justin Boyd is a 68 y.o. male that is following up in regards to his right wrist pain.  He had a fall and had a possible retinacular tear.  He has been doing conservative measures and home therapy and has noticed improvement of his symptoms.  He denies any mechanical symptoms.  No numbness or tingling.  No pain with wrist extension or flexion.    Review of Systems  Constitutional: Negative for fever.  HENT: Negative for congestion.   Respiratory: Negative for cough.   Cardiovascular: Negative for chest pain.  Gastrointestinal: Negative for abdominal pain.  Musculoskeletal: Negative for gait problem.  Neurological: Negative for weakness.  Hematological: Negative for adenopathy.    HISTORY: Past Medical, Surgical, Social, and Family History Reviewed & Updated per EMR.   Pertinent Historical Findings include:  Past Medical History:  Diagnosis Date  . Chicken pox   . Hyperlipidemia     Past Surgical History:  Procedure Laterality Date  . carpal tunnel both hands  2010  . SHOULDER SURGERY Right 2016    Allergies  Allergen Reactions  . Oxycodone-Acetaminophen Other (See Comments)    Insomnia, so is able to take it Insomnia, so is able to take it   . Hydrocodone-Acetaminophen Itching    insomnia insomnia   . Penicillins Swelling    Extreme soreness Extreme soreness     Family History  Problem Relation Age of Onset  . Hypertension Mother   . Stroke Mother      Social History   Socioeconomic History  . Marital status: Married    Spouse name: Not on file  . Number of children: Not on file  . Years of education: Not on file  . Highest education level: Not on file  Occupational History  . Not on file  Social Needs  . Financial resource strain: Not on file  . Food  insecurity    Worry: Not on file    Inability: Not on file  . Transportation needs    Medical: Not on file    Non-medical: Not on file  Tobacco Use  . Smoking status: Former Smoker    Packs/day: 1.00    Years: 45.00    Pack years: 45.00    Types: Cigarettes    Quit date: 05/05/2018    Years since quitting: 0.7  . Smokeless tobacco: Never Used  Substance and Sexual Activity  . Alcohol use: Yes    Comment: 2 cans of beer a week  . Drug use: Never  . Sexual activity: Yes    Partners: Female  Lifestyle  . Physical activity    Days per week: Not on file    Minutes per session: Not on file  . Stress: Not on file  Relationships  . Social Herbalist on phone: Not on file    Gets together: Not on file    Attends religious service: Not on file    Active member of club or organization: Not on file    Attends meetings of clubs or organizations: Not on file    Relationship status: Not on file  . Intimate partner violence    Fear of current or ex partner: Not on file    Emotionally abused: Not on file  Physically abused: Not on file    Forced sexual activity: Not on file  Other Topics Concern  . Not on file  Social History Narrative  . Not on file     PHYSICAL EXAM:  VS: BP 123/72   Ht 5\' 4"  (1.626 m)   Wt 217 lb (98.4 kg)   BMI 37.25 kg/m  Physical Exam Gen: NAD, alert, cooperative with exam, well-appearing ENT: normal lips, normal nasal mucosa,  Eye: normal EOM, normal conjunctiva and lids CV:  no edema, +2 pedal pulses   Resp: no accessory muscle use, non-labored,  Skin: no rashes, no areas of induration  Neuro: normal tone, normal sensation to touch Psych:  normal insight, alert and oriented MSK:  Right wrist:  Normal ROm  Normal grip strength  Normal strength to resistance with finger adduction and abduction.  NVI.      ASSESSMENT & PLAN:   Wrist pain, acute, right Has had improvement of his symptoms.  No mechanical symptoms or numbness or  tingling. -Counseled on supportive care. -Can follow-up as needed.

## 2019-01-20 ENCOUNTER — Other Ambulatory Visit: Payer: Self-pay | Admitting: Family Medicine

## 2019-01-20 DIAGNOSIS — E7800 Pure hypercholesterolemia, unspecified: Secondary | ICD-10-CM

## 2019-01-27 DIAGNOSIS — Z713 Dietary counseling and surveillance: Secondary | ICD-10-CM | POA: Diagnosis not present

## 2019-02-14 ENCOUNTER — Ambulatory Visit (INDEPENDENT_AMBULATORY_CARE_PROVIDER_SITE_OTHER): Payer: BC Managed Care – PPO

## 2019-02-14 ENCOUNTER — Encounter: Payer: Self-pay | Admitting: Acute Care

## 2019-02-14 ENCOUNTER — Telehealth: Payer: Self-pay | Admitting: Acute Care

## 2019-02-14 ENCOUNTER — Ambulatory Visit: Payer: BC Managed Care – PPO | Admitting: Acute Care

## 2019-02-14 ENCOUNTER — Other Ambulatory Visit: Payer: Self-pay

## 2019-02-14 VITALS — BP 122/70 | HR 70 | Temp 97.5°F | Wt 222.6 lb

## 2019-02-14 DIAGNOSIS — R0902 Hypoxemia: Secondary | ICD-10-CM | POA: Insufficient documentation

## 2019-02-14 DIAGNOSIS — J439 Emphysema, unspecified: Secondary | ICD-10-CM | POA: Diagnosis not present

## 2019-02-14 DIAGNOSIS — R0602 Shortness of breath: Secondary | ICD-10-CM

## 2019-02-14 DIAGNOSIS — J9621 Acute and chronic respiratory failure with hypoxia: Secondary | ICD-10-CM | POA: Insufficient documentation

## 2019-02-14 DIAGNOSIS — J441 Chronic obstructive pulmonary disease with (acute) exacerbation: Secondary | ICD-10-CM

## 2019-02-14 HISTORY — DX: Hypoxemia: R09.02

## 2019-02-14 HISTORY — DX: Chronic obstructive pulmonary disease with (acute) exacerbation: J44.1

## 2019-02-14 LAB — CBC WITH DIFFERENTIAL/PLATELET
Basophils Absolute: 0.1 10*3/uL (ref 0.0–0.1)
Basophils Relative: 1 % (ref 0.0–3.0)
Eosinophils Absolute: 0.2 10*3/uL (ref 0.0–0.7)
Eosinophils Relative: 2.5 % (ref 0.0–5.0)
HCT: 42.5 % (ref 39.0–52.0)
Hemoglobin: 14.4 g/dL (ref 13.0–17.0)
Lymphocytes Relative: 25.7 % (ref 12.0–46.0)
Lymphs Abs: 1.8 10*3/uL (ref 0.7–4.0)
MCHC: 33.8 g/dL (ref 30.0–36.0)
MCV: 90 fl (ref 78.0–100.0)
Monocytes Absolute: 0.6 10*3/uL (ref 0.1–1.0)
Monocytes Relative: 9 % (ref 3.0–12.0)
Neutro Abs: 4.4 10*3/uL (ref 1.4–7.7)
Neutrophils Relative %: 61.8 % (ref 43.0–77.0)
Platelets: 161 10*3/uL (ref 150.0–400.0)
RBC: 4.72 Mil/uL (ref 4.22–5.81)
RDW: 13.9 % (ref 11.5–15.5)
WBC: 7.1 10*3/uL (ref 4.0–10.5)

## 2019-02-14 LAB — BASIC METABOLIC PANEL
BUN: 18 mg/dL (ref 6–23)
CO2: 25 mEq/L (ref 19–32)
Calcium: 9.1 mg/dL (ref 8.4–10.5)
Chloride: 106 mEq/L (ref 96–112)
Creatinine, Ser: 0.99 mg/dL (ref 0.40–1.50)
GFR: 75.56 mL/min (ref >60.00)
Glucose, Bld: 183 mg/dL — ABNORMAL HIGH (ref 70–99)
Potassium: 3.8 mEq/L (ref 3.5–5.1)
Sodium: 140 mEq/L (ref 135–145)

## 2019-02-14 LAB — BRAIN NATRIURETIC PEPTIDE: Pro B Natriuretic peptide (BNP): 83 pg/mL (ref 0.0–100.0)

## 2019-02-14 LAB — D-DIMER, QUANTITATIVE: D-Dimer, Quant: 0.26 mcg/mL FEU (ref <0.50)

## 2019-02-14 NOTE — Patient Instructions (Addendum)
CXR today We will call you with the results We will walk you today to ensure you are not dropping your oxygen levels  Your saturations dropped to 88%. We will order oxygen at 2 L to use with exertion.  Continue using Trelegy daily Rinse mouth after use Continue using your rescue inhaler as needed for breakthrough shortness of breath or wheezing. Work on weight loss and physical conditioning We will order a home sleep study to ensure you do not have sleep apnea.  We will call you with results. Follow up with Dr. Ander Slade 1 month. Please contact office for sooner follow up if symptoms do not improve or worsen or seek emergency care

## 2019-02-14 NOTE — Addendum Note (Signed)
Addended by: Suzzanne Cloud E on: 02/14/2019 02:32 PM   Modules accepted: Orders

## 2019-02-14 NOTE — Telephone Encounter (Signed)
Please call patient and let them know there was no significant change in CXR. We will evaluate the labs we drew today and call results when available. Thanks so much

## 2019-02-14 NOTE — Assessment & Plan Note (Signed)
Severe Obstructive Airways Disease Probable Restriction Minimal diffusion deficit New drops in sats and with exertion Plan CXR today We will call you with the results We will walk you today to ensure you are not dropping your oxygen levels  Your saturations dropped to 88%. We will order oxygen at 2 L to use with exertion.  Continue using Trelegy daily Rinse mouth after use Continue using your rescue inhaler as needed for breakthrough shortness of breath or wheezing. Work on weight loss and physical conditioning We will order a home sleep study to ensure you do not have sleep apnea.  We will call you with results. Follow up with Dr. Ander Slade 1 month. Please contact office for sooner follow up if symptoms do not improve or worsen or seek emergency care

## 2019-02-14 NOTE — Assessment & Plan Note (Signed)
Desaturated to 88% with minimal exertion.  Plan We will order oxygen  Wear at 2 L [place] with saturation goal of 88-92% Follow up with Dr. Ander Slade in 1 month

## 2019-02-14 NOTE — Progress Notes (Addendum)
History of Present Illness Justin Boyd is a 68 y.o. male former smoker ( Quit 05/2018) with a 45 pack year smoking history and  Emphysema and COPD.Marland Kitchen He is followed by Dr. Ander Boyd. He is part of the [place]  Of note he has had a 20 pound weight gain since quitting smoking.He is walking every day, and eating a high protein low carb diet.  BP 122/70 (BP Location: Right Arm)   Pulse 70   Temp (!) 97.5 F (36.4 C) (Oral)   Wt 222 lb 9.6 oz (101 kg)   SpO2 90%   BMI 38.21 kg/m     02/14/2019 Follow up Pt. Presents for follow up.He was last seen in the office 09/2018.  He states he has been compliant with Trelegy daily.He states he noted that his sats were dropping to the mid 80's with minimal exertion. He also experienced some chest tightness. He states he has been starting to walk now that it is cooler.He did use his rescue inhaler which did help, but he has rarely had to use the rescue inhaler in the past.This was a change for him so he wanted to be seen.  He denies any sick contacts, no change in secretions, no fever.  He states he does mow his lawn which usually takes him 10,000 steps. He does get short of breath while doing this. He has not been checking sats while he does this. He states he has to break his lawn  into sections of three and take a rest between all 3.He denies any swelling in his lower extremities. He denies any chest pain.He was seen by cardiology 2 weeks ago and there was no indication of any acute issues. He has not had a change in weight. He has been working on weight loss. He has had evaluation for OSA 10 years ago, but he has had significant weight gain since then. We will re-evaluate.He states he does sleep walk, and has since he was 68 years old. He states he has had 5 falls related to sleep walking recently. He denies any fever, chest pain, orthopnea or hemoptysis.Doubt this is a COPD exacerbation as he has no increase in secretions or  any wheezing upon exam. He states dyspnea got better as soon as he    Test Results:  CXR 10/12 The cardiomediastinal silhouette is unchanged with normal heart size. The lungs are mildly hyperinflated with mild chronic peribronchial thickening and interstitial coarsening. No acute airspace consolidation, edema, pleural effusion, pneumothorax is identified. No acute osseous abnormality is seen. COPD without acute process 05/12/2018 LDCT Lung-RADS 2, benign appearance or behavior. Continue annual screening with low-dose chest CT without contrast in 12 months. Aortic atherosclerosis (ICD10-170.0). Coronary artery Calcification.  PFT's 06/2018 FVC (L) 2.65 3.64 72 2.84 78 +7 FEV1 (L) 1.35 2.71 49 1.62 60 +20 FEV1/FVC (%) 51 75 68 57 76 +11 FEV6 (L) 2.59 3.43 75 2.76 80 +6 FEV1/FEV6 (%) 52 78 66 59 75 +12 FEF 25-75% (L/sec) 0.59 2.19 26 0.85 39 +44 FEF Max (L/sec) 5.51 7.53 73 5.26 69 -4 FIVC (L) 2.36 2.72 +15 FIF Max (L/sec) 3.60 3.30 -8 ---- LUNG VOLUMES ---- SVC (L) 3.01 3.64 82 IC (L) 2.26 2.83 79 RV (Pleth) (L) 2.37 2.02 117 TLC (Pleth) (L) 5.38 5.80 92 RV/TLC (Pleth) (%) 44 34 128 ERV (L) 0.75 0.81 93 ---- DIFFUSION ---- DLCOunc (ml/min/mmHg) 16.96 22.07 76 DLCOcor (ml/min/mmHg) 22.07 DL/VA (ml/min/mmHg/L) 3.32 4.25 78 VA (L) 5.11 5.20  98 Severe Obstructive Airways Disease Restriction -Probable Minimal Diffusion Defect  CBC Latest Ref Rng & Units 12/30/2018 03/29/2018 08/20/2010  WBC 4.0 - 10.5 K/uL 6.7 5.9 -  Hemoglobin 13.0 - 17.0 g/dL 14.5 16.6 16.5  Hematocrit 39.0 - 52.0 % 43.0 49.5 -  Platelets 150.0 - 400.0 K/uL 171.0 152.0 -    BMP Latest Ref Rng & Units 12/23/2018 10/21/2018 06/17/2018  Glucose 70 - 99 mg/dL 117(H) 94 108(H)  BUN 6 - 23 mg/dL 26(H) 30(H) 28(H)  Creatinine 0.40 - 1.50 mg/dL 1.14 1.22 0.98  Sodium 135 - 145 mEq/L 142 139 141  Potassium 3.5 - 5.1 mEq/L 4.2 4.6 4.5  Chloride 96 - 112 mEq/L 107 103 103  CO2 19 - 32 mEq/L 24 29 29   Calcium 8.4 -  10.5 mg/dL 9.5 9.3 9.4    BNP No results found for: BNP  ProBNP No results found for: PROBNP  PFT    Component Value Date/Time   FEV1PRE 1.35 06/10/2018 1533   FEV1POST 1.62 06/10/2018 1533   FVCPRE 2.65 06/10/2018 1533   FVCPOST 2.84 06/10/2018 1533   TLC 5.38 06/10/2018 1533   DLCOUNC 16.96 06/10/2018 1533   PREFEV1FVCRT 51 06/10/2018 1533   PSTFEV1FVCRT 57 06/10/2018 1533    No results found.   Past medical hx Past Medical History:  Diagnosis Date  . Chicken pox   . Hyperlipidemia      Social History   Tobacco Use  . Smoking status: Former Smoker    Packs/day: 1.00    Years: 45.00    Pack years: 45.00    Types: Cigarettes    Quit date: 05/05/2018    Years since quitting: 0.7  . Smokeless tobacco: Never Used  Substance Use Topics  . Alcohol use: Yes    Comment: 2 cans of beer a week  . Drug use: Never    JustinBoyd reports that he quit smoking about 9 months ago. His smoking use included cigarettes. He has a 45.00 pack-year smoking history. He has never used smokeless tobacco. He reports current alcohol use. He reports that he does not use drugs.  Tobacco Cessation: 45 pack year smoking history, quit 05/2018  Past surgical hx, Family hx, Social hx all reviewed.  Current Outpatient Medications on File Prior to Visit  Medication Sig  . albuterol (PROVENTIL HFA;VENTOLIN HFA) 108 (90 Base) MCG/ACT inhaler Inhale 2 puffs into the lungs every 4 (four) hours as needed for wheezing or shortness of breath.  . gabapentin (NEURONTIN) 300 MG capsule TAKE 1 CAPSULE(300 MG) BY MOUTH THREE TIMES DAILY  . metoprolol succinate (TOPROL-XL) 50 MG 24 hr tablet Take 1 tablet (50 mg total) by mouth daily. Take with or immediately following a meal.  . nitroGLYCERIN (NITROSTAT) 0.4 MG SL tablet Place 1 tablet (0.4 mg total) under the tongue every 5 (five) minutes as needed for chest pain.  . pantoprazole (PROTONIX) 40 MG tablet Take 40 mg by mouth daily.  . rosuvastatin  (CRESTOR) 10 MG tablet TAKE 1 TABLET(10 MG) BY MOUTH DAILY  . TRELEGY ELLIPTA 100-62.5-25 MCG/INH AEPB INHALE 1 PUFF INTO THE LUNGS DAILY   No current facility-administered medications on file prior to visit.      Allergies  Allergen Reactions  . Oxycodone-Acetaminophen Other (See Comments)    Insomnia, so is able to take it Insomnia, so is able to take it   . Hydrocodone-Acetaminophen Itching    insomnia insomnia   . Penicillins Swelling    Extreme soreness Extreme soreness  Review Of Systems:  Constitutional:   No  weight loss, night sweats,  Fevers, chills, fatigue, or  lassitude.  HEENT:   No headaches,  Difficulty swallowing,  Tooth/dental problems, or  Sore throat,                No sneezing, itching, ear ache, nasal congestion, post nasal drip,   CV:  No chest pain,  Orthopnea, PND, swelling in lower extremities, anasarca, dizziness, palpitations, syncope.   GI  No heartburn, indigestion, abdominal pain, nausea, vomiting, diarrhea, change in bowel habits, loss of appetite, bloody stools.   Resp: + shortness of breath with exertion or at rest.  No excess mucus, no productive cough,  No non-productive cough,  No coughing up of blood.  No change in color of mucus.  No wheezing.  No chest wall deformity  Skin: no rash or lesions.  GU: no dysuria, change in color of urine, no urgency or frequency.  No flank pain, no hematuria   MS:  No joint pain or swelling.  No decreased range of motion.  No back pain.  Psych:  No change in mood or affect. No depression or anxiety.  No memory loss.   Vital Signs BP 122/70 (BP Location: Right Arm)   Pulse 70   Temp (!) 97.5 F (36.4 C) (Oral)   Wt 222 lb 9.6 oz (101 kg)   SpO2 90%   BMI 38.21 kg/m    Physical Exam:  General- No distress,  A&Ox3, pleasant ENT: No sinus tenderness, TM clear, pale nasal mucosa, no oral exudate,no post nasal drip, no LAN Cardiac: S1, S2, regular rate and rhythm, no murmur Chest: No  wheeze/ rales/ dullness; no accessory muscle use, no nasal flaring, no sternal retractions, diminished per bases Abd.: Soft Non-tender, ND, BS +. Body mass index is 38.21 kg/m. Ext: No clubbing cyanosis, edema Neuro:  normal strength, MAE x 4, A&O x 3, appropriate Skin: No rashes, no lesions, warm and dry Psych: normal mood and behavior   Assessment/Plan New onset Hypoxemia Has noticed that sats are dropping with exertion Plan Labs today ( CBC, BMET, BNP, d- dimer)  We will call you with results Walk to determine if you need oxygen ( Sats dropped to 88% with exertion>> will order oxygen at 2 L [place] to be worn with exertion) Follow up in 1 month with Dr. Ander Boyd  COPD: Centriobar emphysemaSevere per PFT's Continue Trelegy one puff once daily Rinse mouth after use Saturation goals with oxygen are 88-92% Consider follow up PFT's   Deconditioning Weight gain over the last year Plan Refer to Pulmonary rehab under Dr. [name] name ( for once they open back up) Work on exercise and diet ( pt sees a nutritionist)    This appointment was 30 min long with over 50% of the time in direct face-to-face patient care, assessment, plan of care, and follow-up.   Magdalen Spatz, NP 02/14/2019  1:35 PM

## 2019-02-14 NOTE — Assessment & Plan Note (Addendum)
Worsening dyspnea Plan Continue using Trelegy daily Rinse mouth after use Continue using your rescue inhaler as needed for breakthrough shortness of breath or wheezing. Work on weight loss and physical conditioning. Place order for Pulmonary Rehab once they reopen. Please place under Dr. [name] name

## 2019-02-15 NOTE — Progress Notes (Signed)
Nothing further needed until D-dimer reports.

## 2019-02-15 NOTE — Progress Notes (Signed)
Pt notified results of d-dimer and rec for 02 usage. Nothing further needed.

## 2019-02-15 NOTE — Telephone Encounter (Signed)
Pt aware of results.  Justin Boyd has already contacted pt regarding completed lab results.  Nothing further needed at this time- will close encounter.

## 2019-02-15 NOTE — Telephone Encounter (Signed)
Pt sent the following MyChart message addressed to SG 02/15/2019 1:24 PM:   "[name],  I just spoke with the medical supply company and had them cancel the order for oxygen.  I was under the assumption that the portable oxygen concentrator would be approximately purse sized.  It is the size of a backpack and requires a stationary base in our home.  I would like to wait until the smaller device becomes available.  Isn't there another company that deals in this device?  Justin Boyd"  [name], please advise. Thank you.

## 2019-02-16 ENCOUNTER — Telehealth: Payer: Self-pay | Admitting: Acute Care

## 2019-02-16 ENCOUNTER — Other Ambulatory Visit: Payer: Self-pay | Admitting: Acute Care

## 2019-02-16 DIAGNOSIS — J9611 Chronic respiratory failure with hypoxia: Secondary | ICD-10-CM

## 2019-02-16 DIAGNOSIS — J432 Centrilobular emphysema: Secondary | ICD-10-CM

## 2019-02-16 NOTE — Telephone Encounter (Signed)
Needs small POC, cancelled and sent tanks away as they were too big. Thanks

## 2019-02-16 NOTE — Telephone Encounter (Signed)
I sent another order to [employer]. Nothing further is needed.

## 2019-02-18 ENCOUNTER — Telehealth: Payer: Self-pay | Admitting: Acute Care

## 2019-02-18 NOTE — Telephone Encounter (Signed)
They are working on getting him a simply go mini which is what the patient wants no action needing on our part

## 2019-02-18 NOTE — Telephone Encounter (Signed)
Will forward message to [name] to make her aware.

## 2019-02-19 DIAGNOSIS — J449 Chronic obstructive pulmonary disease, unspecified: Secondary | ICD-10-CM | POA: Diagnosis not present

## 2019-02-19 DIAGNOSIS — J432 Centrilobular emphysema: Secondary | ICD-10-CM | POA: Diagnosis not present

## 2019-02-21 ENCOUNTER — Telehealth: Payer: Self-pay | Admitting: Acute Care

## 2019-02-21 DIAGNOSIS — J432 Centrilobular emphysema: Secondary | ICD-10-CM

## 2019-02-21 NOTE — Telephone Encounter (Signed)
Thanks so much. 

## 2019-02-21 NOTE — Telephone Encounter (Signed)
Left message for patient to call back  

## 2019-02-23 ENCOUNTER — Telehealth: Payer: Self-pay | Admitting: Acute Care

## 2019-02-23 DIAGNOSIS — J432 Centrilobular emphysema: Secondary | ICD-10-CM

## 2019-02-23 NOTE — Telephone Encounter (Signed)
Spoke with the pt  He states he is thought he was supposed to be on 2 lpm continuous o2 but the order that was sent to DME was for pulsed  [name], can you please clarify this?  Thanks

## 2019-02-23 NOTE — Telephone Encounter (Signed)
LMTCB x2 for pt. I do not see where anyone from our office called the pt.

## 2019-02-24 DIAGNOSIS — Z713 Dietary counseling and surveillance: Secondary | ICD-10-CM | POA: Diagnosis not present

## 2019-02-24 NOTE — Telephone Encounter (Signed)
Order sent to remain on continuous dose at 2L.

## 2019-02-24 NOTE — Telephone Encounter (Signed)
Called and spoke to patient. Patient has been on 2L continuous. Patient was called and told to change settings to 2L pulsed.  Advised patient that per Eric Form, NP that she would like him to continue using the continuous 2L not pulsed.  Patient verbalized understanding.

## 2019-02-24 NOTE — Telephone Encounter (Signed)
Justin Boyd, Please call the DME and make sure the small POC he is requesting has continuous oxygen capabilities. If it does, place the order for continuous oxygen at 2 L. If it only has pulsed capability, we will need to place the order for pulsed. Thanks

## 2019-02-24 NOTE — Telephone Encounter (Signed)
Called and spoke to patient, duplicate message, closing encounter.

## 2019-02-25 NOTE — Telephone Encounter (Signed)
O2 order for SG's request sent per Jonelle Sidle, [facility] 02/25/19. Nothing further at this time.

## 2019-03-01 DIAGNOSIS — Z5181 Encounter for therapeutic drug level monitoring: Secondary | ICD-10-CM | POA: Diagnosis not present

## 2019-03-01 DIAGNOSIS — Z86010 Personal history of colonic polyps: Secondary | ICD-10-CM | POA: Diagnosis not present

## 2019-03-01 DIAGNOSIS — Z79899 Other long term (current) drug therapy: Secondary | ICD-10-CM | POA: Diagnosis not present

## 2019-03-01 DIAGNOSIS — K2270 Barrett's esophagus without dysplasia: Secondary | ICD-10-CM | POA: Diagnosis not present

## 2019-03-03 ENCOUNTER — Other Ambulatory Visit: Payer: Self-pay

## 2019-03-03 ENCOUNTER — Ambulatory Visit: Payer: BC Managed Care – PPO

## 2019-03-03 DIAGNOSIS — R0602 Shortness of breath: Secondary | ICD-10-CM

## 2019-03-10 ENCOUNTER — Other Ambulatory Visit: Payer: Self-pay | Admitting: Family Medicine

## 2019-03-10 DIAGNOSIS — I2510 Atherosclerotic heart disease of native coronary artery without angina pectoris: Secondary | ICD-10-CM

## 2019-03-10 DIAGNOSIS — I10 Essential (primary) hypertension: Secondary | ICD-10-CM

## 2019-03-10 MED ORDER — METOPROLOL SUCCINATE ER 50 MG PO TB24: 50.0000 mg | ORAL_TABLET | Freq: Every day | ORAL | 0 refills | Status: DC

## 2019-03-10 NOTE — Telephone Encounter (Signed)
metoprolol succinate (TOPROL-XL) 50 MG 24 hr tablet  Send to [facility]/[address]

## 2019-03-17 ENCOUNTER — Encounter: Payer: Self-pay | Admitting: Pulmonary Disease

## 2019-03-17 ENCOUNTER — Other Ambulatory Visit: Payer: Self-pay

## 2019-03-17 ENCOUNTER — Ambulatory Visit: Payer: BC Managed Care – PPO | Admitting: Pulmonary Disease

## 2019-03-17 VITALS — BP 132/80 | HR 65 | Temp 97.2°F | Ht 64.0 in | Wt 224.8 lb

## 2019-03-17 DIAGNOSIS — R0602 Shortness of breath: Secondary | ICD-10-CM

## 2019-03-17 DIAGNOSIS — R9389 Abnormal findings on diagnostic imaging of other specified body structures: Secondary | ICD-10-CM

## 2019-03-17 DIAGNOSIS — J432 Centrilobular emphysema: Secondary | ICD-10-CM

## 2019-03-17 NOTE — Progress Notes (Signed)
Justin Boyd    [id number]    05-09-1950  Primary Care Physician:Kremer, Mortimer Fries, MD  Referring Physician: [name], MD [place],  [place] [address]  Chief complaint:   Shortness of breath with exertion Relatively stable since last visit  HPI:  Reformed smoker, recently quit in January Shortness of breath with exertion He continues with graded exercises Unfortunately has gained significant weight since last visit and since quitting smoking  Denies any chest pains or chest discomfort No significant cough or expectoration  He is part of the lung cancer screening program where he had a CT-CT was reviewed with patient showing evidence of emphysema  History of hypercholesterolemia  Exercise tolerance is not really significantly limited He states he can walk a couple of miles if he chooses to, was able to go further prior to recent shortness of breath  Does not feel acutely ill at present No chronic cough Activity level is maintained but does get occasionally winded with previously well-tolerated activities which he relates to his weight gain  Office work  Outpatient Encounter Medications as of 03/17/2019  Medication Sig  . albuterol (PROVENTIL HFA;VENTOLIN HFA) 108 (90 Base) MCG/ACT inhaler Inhale 2 puffs into the lungs every 4 (four) hours as needed for wheezing or shortness of breath.  . gabapentin (NEURONTIN) 300 MG capsule TAKE 1 CAPSULE(300 MG) BY MOUTH THREE TIMES DAILY  . metoprolol succinate (TOPROL-XL) 50 MG 24 hr tablet Take 1 tablet (50 mg total) by mouth daily. Take with or immediately following a meal.  . nitroGLYCERIN (NITROSTAT) 0.4 MG SL tablet Place 1 tablet (0.4 mg total) under the tongue every 5 (five) minutes as needed for chest pain.  . pantoprazole (PROTONIX) 40 MG tablet Take 40 mg by mouth daily.  . rosuvastatin (CRESTOR) 10 MG tablet TAKE 1 TABLET(10 MG) BY MOUTH DAILY  . TRELEGY ELLIPTA 100-62.5-25  MCG/INH AEPB INHALE 1 PUFF INTO THE LUNGS DAILY   No facility-administered encounter medications on file as of 03/17/2019.     Allergies as of 03/17/2019 - Review Complete 03/17/2019  Allergen Reaction Noted  . Oxycodone-acetaminophen Other (See Comments) 12/16/2010  . Hydrocodone-acetaminophen Itching 12/16/2010  . Penicillins Swelling 12/16/2010    Past Medical History:  Diagnosis Date  . Chicken pox   . Hyperlipidemia     Past Surgical History:  Procedure Laterality Date  . carpal tunnel both hands  2010  . SHOULDER SURGERY Right 2016    Family History  Problem Relation Age of Onset  . Hypertension Mother   . Stroke Mother     Social History   Socioeconomic History  . Marital status: Married    Spouse name: Not on file  . Number of children: Not on file  . Years of education: Not on file  . Highest education level: Not on file  Occupational History  . Not on file  Social Needs  . Financial resource strain: Not on file  . Food insecurity    Worry: Not on file    Inability: Not on file  . Transportation needs    Medical: Not on file    Non-medical: Not on file  Tobacco Use  . Smoking status: Former Smoker    Packs/day: 1.00    Years: 45.00    Pack years: 45.00    Types: Cigarettes    Quit date: 05/05/2018    Years since quitting: 0.8  . Smokeless tobacco: Never Used  Substance and  Sexual Activity  . Alcohol use: Yes    Comment: 2 cans of beer a week  . Drug use: Never  . Sexual activity: Yes    Partners: Female  Lifestyle  . Physical activity    Days per week: Not on file    Minutes per session: Not on file  . Stress: Not on file  Relationships  . Social Herbalist on phone: Not on file    Gets together: Not on file    Attends religious service: Not on file    Active member of club or organization: Not on file    Attends meetings of clubs or organizations: Not on file    Relationship status: Not on file  . Intimate partner violence     Fear of current or ex partner: Not on file    Emotionally abused: Not on file    Physically abused: Not on file    Forced sexual activity: Not on file  Other Topics Concern  . Not on file  Social History Narrative  . Not on file    Review of Systems  Constitutional: Negative.   HENT: Negative.   Eyes: Negative.   Respiratory: Positive for shortness of breath. Negative for cough.   Cardiovascular: Negative for chest pain, palpitations and leg swelling.  Gastrointestinal: Negative.   All other systems reviewed and are negative.   Vitals:   03/17/19 0909  BP: 132/80  Pulse: 65  Temp: (!) 97.2 F (36.2 C)  SpO2: 92%     Physical Exam  Constitutional: He appears well-developed and well-nourished.  HENT:  Head: Normocephalic and atraumatic.  Eyes: Pupils are equal, round, and reactive to light. Conjunctivae are normal.  Neck: Normal range of motion. Neck supple. No JVD present. No tracheal deviation present. No thyromegaly present.  Cardiovascular: Normal rate, regular rhythm and normal heart sounds.  Pulmonary/Chest: Effort normal and breath sounds normal. No respiratory distress. He has no wheezes. He has no rales. He exhibits no tenderness.  Abdominal: Soft. Bowel sounds are normal. He exhibits no distension.  Musculoskeletal:        General: No edema.   Data Reviewed: CT scan of the chest reviewed with the patient showing evidence of emphysema and small lung nodules   Pulmonary function study reviewed-severe obstructive disease with significant bronchodilator response, this was reviewed with the patient   Assessment:  Chronic obstructive pulmonary disease -Emphysema noted on CT scan of the chest -PFT with severe obstructive disease with significant bronchodilator response  Shortness of breath -Does not limit his activities of daily living  Obesity -Continuing weight gain is concerning -This may be related to recent discontinuation of smoking -We did talk  about increasing activities as tolerated, watch his diet as tolerated  Abnormal PFT showing severe obstructive disease-discussed, questions answered  He does have oxygen supplementation but has not been requiring it Oxygen levels does drop to low 80s sometimes with activity but recovers very quickly  Plan/Recommendations:  Encouraged to continue with Trelegy  Albuterol as needed-has not been using this much  I will see him back in the office in 6 months  Encouraged to call with significant concerns    Sherrilyn Rist MD [facility] 03/17/2019, 9:12 AM  CC: [name],*

## 2019-03-17 NOTE — Patient Instructions (Signed)
COPD stable CT scan of the chest as were reviewed today-follow-up in January  Graded exercises as tolerated Continue with your Trelegy  No other changes need made  I will see you back in the office in about 6 months Call with any significant concerns

## 2019-03-22 DIAGNOSIS — J449 Chronic obstructive pulmonary disease, unspecified: Secondary | ICD-10-CM | POA: Diagnosis not present

## 2019-03-22 DIAGNOSIS — J432 Centrilobular emphysema: Secondary | ICD-10-CM | POA: Diagnosis not present

## 2019-04-10 ENCOUNTER — Other Ambulatory Visit: Payer: Self-pay | Admitting: Pulmonary Disease

## 2019-04-21 DIAGNOSIS — J449 Chronic obstructive pulmonary disease, unspecified: Secondary | ICD-10-CM | POA: Diagnosis not present

## 2019-04-21 DIAGNOSIS — J432 Centrilobular emphysema: Secondary | ICD-10-CM | POA: Diagnosis not present

## 2019-04-25 ENCOUNTER — Encounter: Payer: Self-pay | Admitting: Family Medicine

## 2019-05-17 ENCOUNTER — Ambulatory Visit (HOSPITAL_BASED_OUTPATIENT_CLINIC_OR_DEPARTMENT_OTHER)
Admission: RE | Admit: 2019-05-17 | Discharge: 2019-05-17 | Disposition: A | Discharge status: Home / Self Care | Payer: BC Managed Care – PPO | Source: Ambulatory Visit | Attending: Acute Care | Admitting: Acute Care

## 2019-05-17 ENCOUNTER — Other Ambulatory Visit: Payer: Self-pay

## 2019-05-17 DIAGNOSIS — F17210 Nicotine dependence, cigarettes, uncomplicated: Secondary | ICD-10-CM | POA: Diagnosis not present

## 2019-05-17 DIAGNOSIS — Z122 Encounter for screening for malignant neoplasm of respiratory organs: Secondary | ICD-10-CM | POA: Diagnosis not present

## 2019-05-17 DIAGNOSIS — Z87891 Personal history of nicotine dependence: Secondary | ICD-10-CM | POA: Diagnosis not present

## 2019-05-17 DIAGNOSIS — F17200 Nicotine dependence, unspecified, uncomplicated: Secondary | ICD-10-CM | POA: Insufficient documentation

## 2019-05-22 DIAGNOSIS — J449 Chronic obstructive pulmonary disease, unspecified: Secondary | ICD-10-CM | POA: Diagnosis not present

## 2019-05-22 DIAGNOSIS — J432 Centrilobular emphysema: Secondary | ICD-10-CM | POA: Diagnosis not present

## 2019-05-26 ENCOUNTER — Other Ambulatory Visit: Payer: Self-pay | Admitting: *Deleted

## 2019-05-26 DIAGNOSIS — Z87891 Personal history of nicotine dependence: Secondary | ICD-10-CM

## 2019-05-26 NOTE — Progress Notes (Signed)
Please call patient and let them  know their  low dose Ct was read as a Lung RADS 2: nodules that are benign in appearance and behavior with a very low likelihood of becoming a clinically active cancer due to size or lack of growth. Recommendation per radiology is for a repeat LDCT in 12 months. .Please let them  know we will order and schedule their  annual screening scan for 05/2020. Please let them  know there was notation of CAD on their  scan.  Please remind the patient  that this is a non-gated exam therefore degree or severity of disease  cannot be determined. Please have them  follow up with their PCP regarding potential risk factor modification, dietary therapy or pharmacologic therapy if clinically indicated. Pt.  is  currently on statin therapy. Please place order for annual  screening scan for  05/2020 and fax results to PCP. Thanks so much. 

## 2019-05-31 ENCOUNTER — Ambulatory Visit: Payer: BC Managed Care – PPO

## 2019-06-06 ENCOUNTER — Other Ambulatory Visit: Payer: Self-pay | Admitting: Family Medicine

## 2019-06-06 DIAGNOSIS — Z713 Dietary counseling and surveillance: Secondary | ICD-10-CM | POA: Diagnosis not present

## 2019-06-06 DIAGNOSIS — I2510 Atherosclerotic heart disease of native coronary artery without angina pectoris: Secondary | ICD-10-CM

## 2019-06-06 DIAGNOSIS — I10 Essential (primary) hypertension: Secondary | ICD-10-CM

## 2019-06-09 ENCOUNTER — Encounter: Payer: Self-pay | Admitting: Nurse Practitioner

## 2019-06-09 ENCOUNTER — Telehealth: Payer: Self-pay | Admitting: Family Medicine

## 2019-06-09 ENCOUNTER — Telehealth (INDEPENDENT_AMBULATORY_CARE_PROVIDER_SITE_OTHER): Payer: BC Managed Care – PPO | Admitting: Nurse Practitioner

## 2019-06-09 VITALS — BP 111/69 | HR 67 | Temp 97.5°F | Ht 64.0 in | Wt 219.7 lb

## 2019-06-09 DIAGNOSIS — H1011 Acute atopic conjunctivitis, right eye: Secondary | ICD-10-CM | POA: Diagnosis not present

## 2019-06-09 MED ORDER — OLOPATADINE HCL 0.2 % OP SOLN: 1.0000 [drp] | Freq: Every day | OPHTHALMIC | 0 refills | Status: DC

## 2019-06-09 NOTE — Progress Notes (Signed)
Virtual Visit via Video Note  I connected with@ on 06/09/19 at 11:00 AM EST by a video enabled telemedicine application and verified that I am speaking with the correct person using two identifiers.  Location: Patient:Home Provider: Office Participants: patient and provider  I discussed the limitations of evaluation and management by telemedicine and the availability of in person appointments. I also discussed with the patient that there may be a patient responsible charge related to this service. The patient expressed understanding and agreed to proceed.  [id number] eye itching  And swelling, worse in AM  History of Present Illness: Eye Problem  The right eye is affected. This is a recurrent problem. The current episode started 1 to 4 weeks ago. The problem occurs intermittently. The problem has been waxing and waning. There was no injury mechanism. The patient is experiencing no pain. There is no known exposure to pink eye. He does not wear contacts. Associated symptoms include eye redness, a foreign body sensation and itching. Pertinent negatives include no blurred vision, eye discharge, double vision, fever, nausea, photophobia, recent URI or vomiting. He has tried nothing for the symptoms.  no headache, no dizziness, no sinus congestion, no ear pain, no loss of smell  Observations/Objective: Physical Exam  Constitutional: He is oriented to person, place, and time.  HENT:  Right Ear: External ear normal.  Left Ear: External ear normal.  Nose: Right sinus exhibits no maxillary sinus tenderness and no frontal sinus tenderness. Left sinus exhibits no maxillary sinus tenderness and no frontal sinus tenderness.  Eyes: Pupils are equal, round, and reactive to light. Conjunctivae, EOM and lids are normal. Right eye exhibits no hordeolum. Left eye exhibits no hordeolum. No scleral icterus.  Pulmonary/Chest: Effort normal.  Musculoskeletal:     Cervical back: Normal range of motion and neck  supple.  Neurological: He is alert and oriented to person, place, and time.  Symmetrical movement of facial muscles. Pupils are round and equal.  Skin: No rash noted.   Assessment and Plan: Antwane was seen today for eye problem.  Diagnoses and all orders for this visit:  Allergic conjunctivitis of right eye -     Olopatadine HCl 0.2 % SOLN; Apply 1 drop to eye daily.   Follow Up Instructions: See avs   I discussed the assessment and treatment plan with the patient. The patient was provided an opportunity to ask questions and all were answered. The patient agreed with the plan and demonstrated an understanding of the instructions.   The patient was advised to call back or seek an in-person evaluation if the symptoms worsen or if the condition fails to improve as anticipated.   Wilfred Lacy, NP

## 2019-06-09 NOTE — Telephone Encounter (Signed)
Pt was wondering when Dr. Ethelene Hal wants him to come in for yearly follow up? And pt wants to schedule pneumonia vaccine as well. Please advise.

## 2019-06-09 NOTE — Telephone Encounter (Signed)
Patient is overdue for follow up

## 2019-06-09 NOTE — Telephone Encounter (Signed)
Pt set up an appt in 08/11/2019 because he wants to wait until he finish both covid vaccine dosages.

## 2019-06-09 NOTE — Patient Instructions (Signed)
Allergic Conjunctivitis A clear membrane (conjunctiva) covers the white part of your eye and the inner surface of your eyelid. Allergic conjunctivitis happens when this membrane has inflammation. This is caused by allergies. Common causes of allergic reactions (allergens) include:  Outdoor allergens, such as: ? Pollen. ? Grass and weeds. ? Mold spores.  Indoor allergens, such as: ? Dust. ? Smoke. ? Mold. ? Pet dander. ? Animal hair. This condition can make your eye red or pink. It can also make your eye feel itchy. This condition cannot be spread from one person to another person (is not contagious). Follow these instructions at home:  Try not to be around things that you are allergic to.  Take or apply over-the-counter and prescription medicines only as told by your doctor. These include any eye drops.  Place a cool, clean washcloth on your eye for 10-20 minutes. Do this 3-4 times a day.  Do not touch or rub your eyes.  Do not wear contact lenses until the inflammation is gone. Wear glasses instead.  Do not wear eye makeup until the inflammation is gone.  Keep all follow-up visits as told by your doctor. This is important. Contact a doctor if:  Your symptoms get worse.  Your symptoms do not get better with treatment.  You have mild eye pain.  You are sensitive to light,  You have spots or blisters on your eyes.  You have pus coming from your eye.  You have a fever. Get help right away if:  You have redness, swelling, or other symptoms in only one eye.  Your vision is blurry.  You have vision changes.  You have very bad eye pain. Summary  Allergic conjunctivitis is caused by allergies. It can make your eye red or pink, and it can make your eye feel itchy.  This condition cannot be spread from one person to another person (is not contagious).  Try not to be around things that you are allergic to.  Take or apply over-the-counter and prescription medicines  only as told by your doctor. These include any eye drops.  Contact your doctor if your symptoms get worse or they do not get better with treatment. This information is not intended to replace advice given to you by your health care provider. Make sure you discuss any questions you have with your health care provider. Document Revised: 08/10/2018 Document Reviewed: 12/14/2015 Elsevier Patient Education  2020 Elsevier Inc.  

## 2019-06-10 ENCOUNTER — Ambulatory Visit: Payer: BC Managed Care – PPO | Attending: Internal Medicine

## 2019-06-10 DIAGNOSIS — Z23 Encounter for immunization: Secondary | ICD-10-CM

## 2019-06-10 NOTE — Progress Notes (Signed)
   Covid-19 Vaccination Clinic  Name:  Justin Boyd    MRN: [id number] DOB: [birth year]  06/10/2019  Mr. Justin Boyd was observed post Covid-19 immunization for 15 minutes without incidence. He was provided with Vaccine Information Sheet and instruction to access the V-Safe system.   Mr. Justin Boyd was instructed to call 911 with any severe reactions post vaccine: Marland Kitchen Difficulty breathing  . Swelling of your face and throat  . A fast heartbeat  . A bad rash all over your body  . Dizziness and weakness    Immunizations Administered    Name Date Dose VIS Date Route   Pfizer COVID-19 Vaccine 06/10/2019  5:48 PM 0.3 mL 04/15/2019 Intramuscular   Manufacturer: [place]   Lot: [id number]   [place]: [id number]

## 2019-06-14 DIAGNOSIS — K2270 Barrett's esophagus without dysplasia: Secondary | ICD-10-CM | POA: Diagnosis not present

## 2019-06-14 DIAGNOSIS — K219 Gastro-esophageal reflux disease without esophagitis: Secondary | ICD-10-CM | POA: Diagnosis not present

## 2019-06-14 DIAGNOSIS — Z86010 Personal history of colonic polyps: Secondary | ICD-10-CM | POA: Diagnosis not present

## 2019-06-21 ENCOUNTER — Ambulatory Visit: Payer: BC Managed Care – PPO

## 2019-06-22 DIAGNOSIS — J432 Centrilobular emphysema: Secondary | ICD-10-CM | POA: Diagnosis not present

## 2019-06-22 DIAGNOSIS — J449 Chronic obstructive pulmonary disease, unspecified: Secondary | ICD-10-CM | POA: Diagnosis not present

## 2019-06-23 ENCOUNTER — Other Ambulatory Visit: Payer: Self-pay

## 2019-06-23 ENCOUNTER — Telehealth (INDEPENDENT_AMBULATORY_CARE_PROVIDER_SITE_OTHER): Payer: BC Managed Care – PPO | Admitting: Cardiology

## 2019-06-23 VITALS — BP 112/72 | HR 67 | Ht 64.0 in | Wt 219.0 lb

## 2019-06-23 DIAGNOSIS — I10 Essential (primary) hypertension: Secondary | ICD-10-CM | POA: Diagnosis not present

## 2019-06-23 DIAGNOSIS — I2510 Atherosclerotic heart disease of native coronary artery without angina pectoris: Secondary | ICD-10-CM

## 2019-06-23 NOTE — Patient Instructions (Addendum)
Medication Instructions:  No medication changes *If you need a refill on your cardiac medications before your next appointment, please call your pharmacy*  Lab Work: None ordered If you have labs (blood work) drawn today and your tests are completely normal, you will receive your results only by: Marland Kitchen MyChart Message (if you have MyChart) OR . A paper copy in the mail If you have any lab test that is abnormal or we need to change your treatment, we will call you to review the results.  Testing/Procedures: None ordered  Follow-Up: At [facility], you and your health needs are our priority.  As part of our continuing mission to provide you with exceptional heart care, we have created designated Provider Care Teams.  These Care Teams include your primary Cardiologist (physician) and Advanced Practice Providers (APPs -  Physician Assistants and Nurse Practitioners) who all work together to provide you with the care you need, when you need it.  Your next appointment:   1 year(s)  The format for your next appointment:   In Person  Provider:   Jyl Heinz, MD  Other Instructions A recall has been placed to remind you at 10 months to make an appointment.

## 2019-06-23 NOTE — Progress Notes (Signed)
Virtual Visit via Video Note   This visit type was conducted due to national recommendations for restrictions regarding the COVID-19 Pandemic (e.g. social distancing) in an effort to limit this patient's exposure and mitigate transmission in our community.  Due to his co-morbid illnesses, this patient is at least at moderate risk for complications without adequate follow up.  This format is felt to be most appropriate for this patient at this time.  All issues noted in this document were discussed and addressed.  A limited physical exam was performed with this format.  Please refer to the patient's chart for his consent to telehealth for [facility].   Date:  06/23/2019   ID:  Justin Boyd, DOB 1951-02-03, MRN [id number]  Patient Location: Home Provider Location: Office  PCP:  [name], MD  Cardiologist:  Jenean Lindau, MD  Electrophysiologist:  None   Evaluation Performed:  Follow-Up Visit  Chief Complaint: Chest discomfort  History of Present Illness:    Justin Boyd is a 69 y.o. male with past medical history of essential hypertension and dyslipidemia.  He denies any problems at this time and takes care of activities of daily living.  No chest pain orthopnea or PND.  He is trying to lose weight by following special diets and exercising.  At the time of my evaluation, the patient is alert awake oriented and in no distress.  The patient does not have symptoms concerning for COVID-19 infection (fever, chills, cough, or new shortness of breath).    Past Medical History:  Diagnosis Date  . Chicken pox   . Hyperlipidemia    Past Surgical History:  Procedure Laterality Date  . carpal tunnel both hands  2010  . SHOULDER SURGERY Right 2016     Current Meds  Medication Sig  . albuterol (PROVENTIL HFA;VENTOLIN HFA) 108 (90 Base) MCG/ACT inhaler Inhale 2 puffs into the lungs every 4 (four) hours as needed for wheezing or shortness of breath.  . gabapentin  (NEURONTIN) 300 MG capsule TAKE 1 CAPSULE(300 MG) BY MOUTH THREE TIMES DAILY  . Melatonin 5 MG CHEW Chew by mouth.  . metoprolol succinate (TOPROL-XL) 50 MG 24 hr tablet TAKE 1 TABLET(50 MG TOTAL) BY MOUTH DAILY WITH OR IMMEDIATELY FOLLOWING A MEAL  . nitroGLYCERIN (NITROSTAT) 0.4 MG SL tablet Place 1 tablet (0.4 mg total) under the tongue every 5 (five) minutes as needed for chest pain.  . pantoprazole (PROTONIX) 40 MG tablet Take 40 mg by mouth daily.  . rosuvastatin (CRESTOR) 10 MG tablet TAKE 1 TABLET(10 MG) BY MOUTH DAILY  . TRELEGY ELLIPTA 100-62.5-25 MCG/INH AEPB INHALE 1 PUFF INTO THE LUNGS DAILY     Allergies:   Oxycodone-acetaminophen, Hydrocodone-acetaminophen, and Penicillins   Social History   Tobacco Use  . Smoking status: Former Smoker    Packs/day: 1.00    Years: 45.00    Pack years: 45.00    Types: Cigarettes    Quit date: 05/05/2018    Years since quitting: 1.1  . Smokeless tobacco: Never Used  Substance Use Topics  . Alcohol use: Yes    Comment: 2 cans of beer a week  . Drug use: Never     Family Hx: The patient's family history includes Hypertension in his mother; Stroke in his mother.  ROS:   Please see the history of present illness.    As mentioned above All other systems reviewed and are negative.   Prior CV studies:   The following studies were reviewed today:  Study Highlights   The left ventricular ejection fraction is hyperdynamic (>65%).  Nuclear stress EF: 70%.  There was no ST segment deviation noted during stress.  The study is normal.  This is a low risk study.  One ventricular triplet seen in recovery.       Labs/Other Tests and Data Reviewed:    EKG:    Recent Labs: 10/21/2018: ALT 38 02/14/2019: BUN 18; Creatinine, Ser 0.99; Hemoglobin 14.4; Platelets 161.0; Potassium 3.8; Pro B Natriuretic peptide (BNP) 83.0; Sodium 140   Recent Lipid Panel Lab Results  Component Value Date/Time   CHOL 157 10/21/2018 08:12 AM    TRIG 279.0 (H) 10/21/2018 08:12 AM   HDL 39.20 10/21/2018 08:12 AM   CHOLHDL 4 10/21/2018 08:12 AM   LDLDIRECT 78.0 12/30/2018 10:25 AM    Wt Readings from Last 3 Encounters:  06/23/19 219 lb (99.3 kg)  06/09/19 219 lb 11.2 oz (99.7 kg)  03/17/19 224 lb 12.8 oz (102 kg)     Objective:    Vital Signs:  BP 112/72   Pulse 67   Ht 5\' 4"  (1.626 m)   Wt 219 lb (99.3 kg)   BMI 37.59 kg/m    VITAL SIGNS:  reviewed  ASSESSMENT & PLAN:    1. Chest discomfort: The symptoms have resolved results of the testing was discussed with him at length and is happy about it.  He is now walking on a regular basis without any symptoms 2. Essential hypertension: Blood pressure stable 3. Mixed dyslipidemia and overweight status: I discussed these findings with the patient at extensive length.  He is on appropriate therapy is walking regularly.  He tells me that he has lost significant weight since January.  Echo reassuring for removal of this. 4. He will be seen in follow-up appointment on annual basis or earlier if he has any concerns.  Patient had multiple questions which were answered to satisfaction.  COVID-19 Education: The signs and symptoms of COVID-19 were discussed with the patient and how to seek care for testing (follow up with PCP or arrange E-visit).  The importance of social distancing was discussed today.  Time:   Today, I have spent 15 minutes with the patient with telehealth technology discussing the above problems.     Medication Adjustments/Labs and Tests Ordered: Current medicines are reviewed at length with the patient today.  Concerns regarding medicines are outlined above.   Tests Ordered: No orders of the defined types were placed in this encounter.   Medication Changes: No orders of the defined types were placed in this encounter.   Follow Up:  In Person in 12 month(s)  Signed, Jenean Lindau, MD  06/23/2019 11:28 AM    [place]

## 2019-06-24 ENCOUNTER — Other Ambulatory Visit: Payer: Self-pay | Admitting: Family Medicine

## 2019-06-24 DIAGNOSIS — G2581 Restless legs syndrome: Secondary | ICD-10-CM

## 2019-07-04 DIAGNOSIS — Z713 Dietary counseling and surveillance: Secondary | ICD-10-CM | POA: Diagnosis not present

## 2019-07-05 ENCOUNTER — Ambulatory Visit: Payer: BC Managed Care – PPO | Attending: Internal Medicine

## 2019-07-05 ENCOUNTER — Ambulatory Visit: Payer: BC Managed Care – PPO

## 2019-07-05 DIAGNOSIS — Z23 Encounter for immunization: Secondary | ICD-10-CM

## 2019-07-05 NOTE — Progress Notes (Signed)
   Covid-19 Vaccination Clinic  Name:  Tacuma Lauerman    MRN: [id number] DOB: 1950/05/09  07/05/2019  Mr. Buchko was observed post Covid-19 immunization for 15 minutes without incident. He was provided with Vaccine Information Sheet and instruction to access the V-Safe system.   Mr. Hoglund was instructed to call 911 with any severe reactions post vaccine: Marland Kitchen Difficulty breathing  . Swelling of face and throat  . A fast heartbeat  . A bad rash all over body  . Dizziness and weakness   Immunizations Administered    Name Date Dose VIS Date Route   Pfizer COVID-19 Vaccine 07/05/2019 11:59 AM 0.3 mL 04/15/2019 Intramuscular   Manufacturer: [place]   Lot: [id number]   [place]: [id number]

## 2019-07-08 DIAGNOSIS — H2513 Age-related nuclear cataract, bilateral: Secondary | ICD-10-CM | POA: Diagnosis not present

## 2019-07-17 ENCOUNTER — Other Ambulatory Visit: Payer: Self-pay | Admitting: Family Medicine

## 2019-07-17 DIAGNOSIS — E7800 Pure hypercholesterolemia, unspecified: Secondary | ICD-10-CM

## 2019-07-20 DIAGNOSIS — J449 Chronic obstructive pulmonary disease, unspecified: Secondary | ICD-10-CM | POA: Diagnosis not present

## 2019-07-20 DIAGNOSIS — J432 Centrilobular emphysema: Secondary | ICD-10-CM | POA: Diagnosis not present

## 2019-08-04 DIAGNOSIS — Z713 Dietary counseling and surveillance: Secondary | ICD-10-CM | POA: Diagnosis not present

## 2019-08-11 ENCOUNTER — Ambulatory Visit: Payer: BC Managed Care – PPO | Admitting: Family Medicine

## 2019-08-19 ENCOUNTER — Ambulatory Visit: Payer: BC Managed Care – PPO | Admitting: Family Medicine

## 2019-08-20 DIAGNOSIS — J432 Centrilobular emphysema: Secondary | ICD-10-CM | POA: Diagnosis not present

## 2019-08-20 DIAGNOSIS — J449 Chronic obstructive pulmonary disease, unspecified: Secondary | ICD-10-CM | POA: Diagnosis not present

## 2019-08-29 ENCOUNTER — Encounter: Payer: Self-pay | Admitting: Family Medicine

## 2019-08-30 NOTE — Telephone Encounter (Signed)
Not always sure what I will need. Also with pre ordered labs, the visit becomes more about the lab work than the actual physical itself.

## 2019-08-31 ENCOUNTER — Other Ambulatory Visit: Payer: Self-pay

## 2019-08-31 DIAGNOSIS — Z713 Dietary counseling and surveillance: Secondary | ICD-10-CM | POA: Diagnosis not present

## 2019-09-01 ENCOUNTER — Encounter: Payer: Self-pay | Admitting: Family Medicine

## 2019-09-01 ENCOUNTER — Ambulatory Visit: Payer: BC Managed Care – PPO | Admitting: Family Medicine

## 2019-09-01 VITALS — BP 136/70 | HR 71 | Temp 96.1°F | Ht 64.0 in | Wt 222.0 lb

## 2019-09-01 DIAGNOSIS — Z23 Encounter for immunization: Secondary | ICD-10-CM | POA: Diagnosis not present

## 2019-09-01 DIAGNOSIS — I10 Essential (primary) hypertension: Secondary | ICD-10-CM | POA: Diagnosis not present

## 2019-09-01 DIAGNOSIS — Z0000 Encounter for general adult medical examination without abnormal findings: Secondary | ICD-10-CM

## 2019-09-01 DIAGNOSIS — E7800 Pure hypercholesterolemia, unspecified: Secondary | ICD-10-CM | POA: Diagnosis not present

## 2019-09-01 DIAGNOSIS — E559 Vitamin D deficiency, unspecified: Secondary | ICD-10-CM

## 2019-09-01 DIAGNOSIS — D492 Neoplasm of unspecified behavior of bone, soft tissue, and skin: Secondary | ICD-10-CM | POA: Insufficient documentation

## 2019-09-01 DIAGNOSIS — Z125 Encounter for screening for malignant neoplasm of prostate: Secondary | ICD-10-CM | POA: Diagnosis not present

## 2019-09-01 DIAGNOSIS — I2510 Atherosclerotic heart disease of native coronary artery without angina pectoris: Secondary | ICD-10-CM | POA: Diagnosis not present

## 2019-09-01 DIAGNOSIS — R7309 Other abnormal glucose: Secondary | ICD-10-CM | POA: Diagnosis not present

## 2019-09-01 HISTORY — DX: Other abnormal glucose: R73.09

## 2019-09-01 HISTORY — DX: Neoplasm of unspecified behavior of bone, soft tissue, and skin: D49.2

## 2019-09-01 HISTORY — DX: Encounter for general adult medical examination without abnormal findings: Z00.00

## 2019-09-01 LAB — HEPATIC FUNCTION PANEL
ALT: 34 U/L (ref 0–53)
AST: 19 U/L (ref 0–37)
Albumin: 4.2 g/dL (ref 3.5–5.2)
Alkaline Phosphatase: 88 U/L (ref 39–117)
Bilirubin, Direct: 0.1 mg/dL (ref 0.0–0.3)
Total Bilirubin: 0.5 mg/dL (ref 0.2–1.2)
Total Protein: 6.4 g/dL (ref 6.0–8.3)

## 2019-09-01 LAB — BASIC METABOLIC PANEL
BUN: 19 mg/dL (ref 6–23)
CO2: 27 mEq/L (ref 19–32)
Calcium: 9 mg/dL (ref 8.4–10.5)
Chloride: 106 mEq/L (ref 96–112)
Creatinine, Ser: 1.04 mg/dL (ref 0.40–1.50)
GFR: 71.27 mL/min (ref >60.00)
Glucose, Bld: 104 mg/dL — ABNORMAL HIGH (ref 70–99)
Potassium: 4.1 mEq/L (ref 3.5–5.1)
Sodium: 142 mEq/L (ref 135–145)

## 2019-09-01 LAB — CBC
HCT: 44.1 % (ref 39.0–52.0)
Hemoglobin: 14.7 g/dL (ref 13.0–17.0)
MCHC: 33.4 g/dL (ref 30.0–36.0)
MCV: 89.1 fl (ref 78.0–100.0)
Platelets: 157 10*3/uL (ref 150.0–400.0)
RBC: 4.95 Mil/uL (ref 4.22–5.81)
RDW: 14.4 % (ref 11.5–15.5)
WBC: 6.7 10*3/uL (ref 4.0–10.5)

## 2019-09-01 LAB — LIPID PANEL
Cholesterol: 149 mg/dL (ref 0–200)
HDL: 34.1 mg/dL — ABNORMAL LOW (ref >39.00)
Total CHOL/HDL Ratio: 4
Triglycerides: 423 mg/dL — ABNORMAL HIGH (ref 0.0–149.0)

## 2019-09-01 LAB — URINALYSIS, ROUTINE W REFLEX MICROSCOPIC
Bilirubin Urine: NEGATIVE
Hgb urine dipstick: NEGATIVE
Ketones, ur: NEGATIVE
Leukocytes,Ua: NEGATIVE
Nitrite: NEGATIVE
RBC / HPF: NONE SEEN (ref 0–?)
Specific Gravity, Urine: >=1.03 — AB (ref 1.000–1.030)
Total Protein, Urine: NEGATIVE
Urine Glucose: NEGATIVE
Urobilinogen, UA: 0.2 (ref 0.0–1.0)
pH: 5.5 (ref 5.0–8.0)

## 2019-09-01 LAB — LDL CHOLESTEROL, DIRECT: Direct LDL: 77 mg/dL

## 2019-09-01 LAB — VITAMIN D 25 HYDROXY (VIT D DEFICIENCY, FRACTURES): VITD: 21.08 ng/mL — ABNORMAL LOW (ref 30.00–100.00)

## 2019-09-01 LAB — HEMOGLOBIN A1C: Hgb A1c MFr Bld: 5.8 % (ref 4.6–6.5)

## 2019-09-01 LAB — PSA: PSA: 0.32 ng/mL (ref 0.10–4.00)

## 2019-09-01 NOTE — Patient Instructions (Signed)
Health Maintenance After Age 67 After age 39, you are at a higher risk for certain long-term diseases and infections as well as injuries from falls. Falls are a major cause of broken bones and head injuries in people who are older than age 66. Getting regular preventive care can help to keep you healthy and well. Preventive care includes getting regular testing and making lifestyle changes as recommended by your health care provider. Talk with your health care provider about:  Which screenings and tests you should have. A screening is a test that checks for a disease when you have no symptoms.  A diet and exercise plan that is right for you. What should I know about screenings and tests to prevent falls? Screening and testing are the best ways to find a health problem early. Early diagnosis and treatment give you the best chance of managing medical conditions that are common after age 8. Certain conditions and lifestyle choices may make you more likely to have a fall. Your health care provider may recommend:  Regular vision checks. Poor vision and conditions such as cataracts can make you more likely to have a fall. If you wear glasses, make sure to get your prescription updated if your vision changes.  Medicine review. Work with your health care provider to regularly review all of the medicines you are taking, including over-the-counter medicines. Ask your health care provider about any side effects that may make you more likely to have a fall. Tell your health care provider if any medicines that you take make you feel dizzy or sleepy.  Osteoporosis screening. Osteoporosis is a condition that causes the bones to get weaker. This can make the bones weak and cause them to break more easily.  Blood pressure screening. Blood pressure changes and medicines to control blood pressure can make you feel dizzy.  Strength and balance checks. Your health care provider may recommend certain tests to check your  strength and balance while standing, walking, or changing positions.  Foot health exam. Foot pain and numbness, as well as not wearing proper footwear, can make you more likely to have a fall.  Depression screening. You may be more likely to have a fall if you have a fear of falling, feel emotionally low, or feel unable to do activities that you used to do.  Alcohol use screening. Using too much alcohol can affect your balance and may make you more likely to have a fall. What actions can I take to lower my risk of falls? General instructions  Talk with your health care provider about your risks for falling. Tell your health care provider if: ? You fall. Be sure to tell your health care provider about all falls, even ones that seem minor. ? You feel dizzy, sleepy, or off-balance.  Take over-the-counter and prescription medicines only as told by your health care provider. These include any supplements.  Eat a healthy diet and maintain a healthy weight. A healthy diet includes low-fat dairy products, low-fat (lean) meats, and fiber from whole grains, beans, and lots of fruits and vegetables. Home safety  Remove any tripping hazards, such as rugs, cords, and clutter.  Install safety equipment such as grab bars in bathrooms and safety rails on stairs.  Keep rooms and walkways well-lit. Activity   Follow a regular exercise program to stay fit. This will help you maintain your balance. Ask your health care provider what types of exercise are appropriate for you.  If you need a cane or  walker, use it as recommended by your health care provider.  Wear supportive shoes that have nonskid soles. Lifestyle  Do not drink alcohol if your health care provider tells you not to drink.  If you drink alcohol, limit how much you have: ? 0-1 drink a day for women. ? 0-2 drinks a day for men.  Be aware of how much alcohol is in your drink. In the [place] one drink equals one typical bottle of beer (12  oz), one-half glass of wine (5 oz), or one shot of hard liquor (1 oz).  Do not use any products that contain nicotine or tobacco, such as cigarettes and e-cigarettes. If you need help quitting, ask your health care provider. Summary  Having a healthy lifestyle and getting preventive care can help to protect your health and wellness after age 27.  Screening and testing are the best way to find a health problem early and help you avoid having a fall. Early diagnosis and treatment give you the best chance for managing medical conditions that are more common for people who are older than age 13.  Falls are a major cause of broken bones and head injuries in people who are older than age 81. Take precautions to prevent a fall at home.  Work with your health care provider to learn what changes you can make to improve your health and wellness and to prevent falls. This information is not intended to replace advice given to you by your health care provider. Make sure you discuss any questions you have with your health care provider. Document Revised: 08/12/2018 Document Reviewed: 03/04/2017 Elsevier Patient Education  2020 [place] 65 Years and Older, Male Preventive care refers to lifestyle choices and visits with your health care provider that can promote health and wellness. This includes:  A yearly physical exam. This is also called an annual well check.  Regular dental and eye exams.  Immunizations.  Screening for certain conditions.  Healthy lifestyle choices, such as diet and exercise. What can I expect for my preventive care visit? Physical exam Your health care provider will check:  Height and weight. These may be used to calculate body mass index (BMI), which is a measurement that tells if you are at a healthy weight.  Heart rate and blood pressure.  Your skin for abnormal spots. Counseling Your health care provider may ask you questions  about:  Alcohol, tobacco, and drug use.  Emotional well-being.  Home and relationship well-being.  Sexual activity.  Eating habits.  History of falls.  Memory and ability to understand (cognition).  Work and work Statistician. What immunizations do I need?  Influenza (flu) vaccine  This is recommended every year. Tetanus, diphtheria, and pertussis (Tdap) vaccine  You may need a Td booster every 10 years. Varicella (chickenpox) vaccine  You may need this vaccine if you have not already been vaccinated. Zoster (shingles) vaccine  You may need this after age 76. Pneumococcal conjugate (PCV13) vaccine  One dose is recommended after age 49. Pneumococcal polysaccharide (PPSV23) vaccine  One dose is recommended after age 66. Measles, mumps, and rubella (MMR) vaccine  You may need at least one dose of MMR if you were born in 1957 or later. You may also need a second dose. Meningococcal conjugate (MenACWY) vaccine  You may need this if you have certain conditions. Hepatitis A vaccine  You may need this if you have certain conditions or if you travel or work in places where  you may be exposed to hepatitis A. °Hepatitis B vaccine °· You may need this if you have certain conditions or if you travel or work in places where you may be exposed to hepatitis B. °Haemophilus influenzae type b (Hib) vaccine °· You may need this if you have certain conditions. °You may receive vaccines as individual doses or as more than one vaccine together in one shot (combination vaccines). Talk with your health care provider about the risks and benefits of combination vaccines. °What tests do I need? °Blood tests °· Lipid and cholesterol levels. These may be checked every 5 years, or more frequently depending on your overall health. °· Hepatitis C test. °· Hepatitis B test. °Screening °· Lung cancer screening. You may have this screening every year starting at age 55 if you have a 30-pack-year history of  smoking and currently smoke or have quit within the past 15 years. °· Colorectal cancer screening. All adults should have this screening starting at age 50 and continuing until age 75. Your health care provider may recommend screening at age 45 if you are at increased risk. You will have tests every 1-10 years, depending on your results and the type of screening test. °· Prostate cancer screening. Recommendations will vary depending on your family history and other risks. °· Diabetes screening. This is done by checking your blood sugar (glucose) after you have not eaten for a while (fasting). You may have this done every 1-3 years. °· Abdominal aortic aneurysm (AAA) screening. You may need this if you are a current or former smoker. °· Sexually transmitted disease (STD) testing. °Follow these instructions at home: °Eating and drinking °· Eat a diet that includes fresh fruits and vegetables, whole grains, lean protein, and low-fat dairy products. Limit your intake of foods with high amounts of sugar, saturated fats, and salt. °· Take vitamin and mineral supplements as recommended by your health care provider. °· Do not drink alcohol if your health care provider tells you not to drink. °· If you drink alcohol: °? Limit how much you have to 0-2 drinks a day. °? Be aware of how much alcohol is in your drink. In the [place] one drink equals one 12 oz bottle of beer (355 mL), one 5 oz glass of wine (148 mL), or one 1½ oz glass of hard liquor (44 mL). °Lifestyle °· Take daily care of your teeth and gums. °· Stay active. Exercise for at least 30 minutes on 5 or more days each week. °· Do not use any products that contain nicotine or tobacco, such as cigarettes, e-cigarettes, and chewing tobacco. If you need help quitting, ask your health care provider. °· If you are sexually active, practice safe sex. Use a condom or other form of protection to prevent STIs (sexually transmitted infections). °· Talk with your health care  provider about taking a low-dose aspirin or statin. °What's next? °· Visit your health care provider once a year for a well check visit. °· Ask your health care provider how often you should have your eyes and teeth checked. °· Stay up to date on all vaccines. °This information is not intended to replace advice given to you by your health care provider. Make sure you discuss any questions you have with your health care provider. °Document Revised: 04/15/2018 Document Reviewed: 04/15/2018 °Elsevier Patient Education © 2020 [employer] ° °Exercising to Lose Weight °Exercise is structured, repetitive physical activity to improve fitness and health. Getting regular exercise is important for everyone.   It is especially important if you are overweight. Being overweight increases your risk of heart disease, stroke, diabetes, high blood pressure, and several types of cancer. Reducing your calorie intake and exercising can help you lose weight. °Exercise is usually categorized as moderate or vigorous intensity. To lose weight, most people need to do a certain amount of moderate-intensity or vigorous-intensity exercise each week. °Moderate-intensity exercise ° °Moderate-intensity exercise is any activity that gets you moving enough to burn at least three times more energy (calories) than if you were sitting. °Examples of moderate exercise include: °· Walking a mile in 15 minutes. °· Doing light yard work. °· Biking at an easy pace. °Most people should get at least 150 minutes (2 hours and 30 minutes) a week of moderate-intensity exercise to maintain their body weight. °Vigorous-intensity exercise °Vigorous-intensity exercise is any activity that gets you moving enough to burn at least six times more calories than if you were sitting. When you exercise at this intensity, you should be working hard enough that you are not able to carry on a conversation. °Examples of vigorous exercise include: °· Running. °· Playing a team  sport, such as football, basketball, and soccer. °· Jumping rope. °Most people should get at least 75 minutes (1 hour and 15 minutes) a week of vigorous-intensity exercise to maintain their body weight. °How can exercise affect me? °When you exercise enough to burn more calories than you eat, you lose weight. Exercise also reduces body fat and builds muscle. The more muscle you have, the more calories you burn. Exercise also: °· Improves mood. °· Reduces stress and tension. °· Improves your overall fitness, flexibility, and endurance. °· Increases bone strength. °The amount of exercise you need to lose weight depends on: °· Your age. °· The type of exercise. °· Any health conditions you have. °· Your overall physical ability. °Talk to your health care provider about how much exercise you need and what types of activities are safe for you. °What actions can I take to lose weight? °Nutrition ° °· Make changes to your diet as told by your health care provider or diet and nutrition specialist (dietitian). This may include: °? Eating fewer calories. °? Eating more protein. °? Eating less unhealthy fats. °? Eating a diet that includes fresh fruits and vegetables, whole grains, low-fat dairy products, and lean protein. °? Avoiding foods with added fat, salt, and sugar. °· Drink plenty of water while you exercise to prevent dehydration or heat stroke. °Activity °· Choose an activity that you enjoy and set realistic goals. Your health care provider can help you make an exercise plan that works for you. °· Exercise at a moderate or vigorous intensity most days of the week. °? The intensity of exercise may vary from person to person. You can tell how intense a workout is for you by paying attention to your breathing and heartbeat. Most people will notice their breathing and heartbeat get faster with more intense exercise. °· Do resistance training twice each week, such as: °? Push-ups. °? Sit-ups. °? Lifting weights. °? Using  resistance bands. °· Getting short amounts of exercise can be just as helpful as long structured periods of exercise. If you have trouble finding time to exercise, try to include exercise in your daily routine. °? Get up, stretch, and walk around every 30 minutes throughout the day. °? Go for a walk during your lunch break. °? Park your car farther away from your destination. °? If you take public transportation, get   off one stop early and walk the rest of the way. °? Make phone calls while standing up and walking around. °? Take the stairs instead of elevators or escalators. °· Wear comfortable clothes and shoes with good support. °· Do not exercise so much that you hurt yourself, feel dizzy, or get very short of breath. °Where to find more information °· [place] Department of Health and Human Services: www.hhs.gov °· Centers for Disease Control and Prevention (CDC): www.cdc.gov °Contact a health care provider: °· Before starting a new exercise program. °· If you have questions or concerns about your weight. °· If you have a medical problem that keeps you from exercising. °Get help right away if you have any of the following while exercising: °· Injury. °· Dizziness. °· Difficulty breathing or shortness of breath that does not go away when you stop exercising. °· Chest pain. °· Rapid heartbeat. °Summary °· Being overweight increases your risk of heart disease, stroke, diabetes, high blood pressure, and several types of cancer. °· Losing weight happens when you burn more calories than you eat. °· Reducing the amount of calories you eat in addition to getting regular moderate or vigorous exercise each week helps you lose weight. °This information is not intended to replace advice given to you by your health care provider. Make sure you discuss any questions you have with your health care provider. °Document Revised: 05/04/2017 Document Reviewed: 05/04/2017 °Elsevier Patient Education © 2020 [employer] ° °

## 2019-09-01 NOTE — Addendum Note (Signed)
Addended by: Lynnea Ferrier on: 09/01/2019 11:04 AM   Modules accepted: Orders

## 2019-09-01 NOTE — Addendum Note (Signed)
Addended by: Marrion Coy on: 09/01/2019 04:56 PM   Modules accepted: Orders

## 2019-09-01 NOTE — Addendum Note (Signed)
Addended by: Lynnea Ferrier on: 09/01/2019 10:59 AM   Modules accepted: Orders

## 2019-09-01 NOTE — Progress Notes (Signed)
Established Patient Office Visit  Subjective:  Patient ID: Justin Boyd, male    DOB: 1950/09/03  Age: 69 y.o. MRN: [id number]  CC:  Chief Complaint  Patient presents with  . Annual Exam    CPE, personal questions here for pneumonia shot as well.     HPI Justin Boyd presents for physical exam and follow-up on multiple issues.  He has seen the eye doctor every 6 months and they are doing retinal scans.  He is exercising 3-4 times weekly and is working towards a goal of 10,000 steps.  Working with a nutritionist to lose some weight who is suggested strength training.  Past Medical History:  Diagnosis Date  . Chicken pox   . Hyperlipidemia     Past Surgical History:  Procedure Laterality Date  . carpal tunnel both hands  2010  . SHOULDER SURGERY Right 2016    Family History  Problem Relation Age of Onset  . Hypertension Mother   . Stroke Mother     Social History   Socioeconomic History  . Marital status: Married    Spouse name: Not on file  . Number of children: Not on file  . Years of education: Not on file  . Highest education level: Not on file  Occupational History  . Not on file  Tobacco Use  . Smoking status: Former Smoker    Packs/day: 1.00    Years: 45.00    Pack years: 45.00    Types: Cigarettes    Quit date: 05/05/2018    Years since quitting: 1.3  . Smokeless tobacco: Never Used  Substance and Sexual Activity  . Alcohol use: Yes    Comment: 2 cans of beer a week  . Drug use: Never  . Sexual activity: Yes    Partners: Female  Other Topics Concern  . Not on file  Social History Narrative  . Not on file   Social Determinants of Health   Financial Resource Strain:   . Difficulty of Paying Living Expenses:   Food Insecurity:   . Worried About Charity fundraiser in the Last Year:   . Arboriculturist in the Last Year:   Transportation Needs:   . Film/video editor (Medical):   Marland Kitchen Lack of Transportation (Non-Medical):   Physical  Activity:   . Days of Exercise per Week:   . Minutes of Exercise per Session:   Stress:   . Feeling of Stress :   Social Connections:   . Frequency of Communication with Friends and Family:   . Frequency of Social Gatherings with Friends and Family:   . Attends Religious Services:   . Active Member of Clubs or Organizations:   . Attends Archivist Meetings:   Marland Kitchen Marital Status:   Intimate Partner Violence:   . Fear of Current or Ex-Partner:   . Emotionally Abused:   Marland Kitchen Physically Abused:   . Sexually Abused:     Outpatient Medications Prior to Visit  Medication Sig Dispense Refill  . albuterol (PROVENTIL HFA;VENTOLIN HFA) 108 (90 Base) MCG/ACT inhaler Inhale 2 puffs into the lungs every 4 (four) hours as needed for wheezing or shortness of breath. 1 Inhaler 5  . gabapentin (NEURONTIN) 300 MG capsule TAKE 1 CAPSULE(300 MG) BY MOUTH THREE TIMES DAILY 90 capsule 3  . Melatonin 5 MG CHEW Chew by mouth.    . metoprolol succinate (TOPROL-XL) 50 MG 24 hr tablet TAKE 1 TABLET(50 MG TOTAL) BY MOUTH DAILY WITH  OR IMMEDIATELY FOLLOWING A MEAL 90 tablet 0  . pantoprazole (PROTONIX) 40 MG tablet Take 40 mg by mouth daily.    . rosuvastatin (CRESTOR) 10 MG tablet TAKE 1 TABLET(10 MG) BY MOUTH DAILY 90 tablet 0  . TRELEGY ELLIPTA 100-62.5-25 MCG/INH AEPB INHALE 1 PUFF INTO THE LUNGS DAILY 180 each 1  . nitroGLYCERIN (NITROSTAT) 0.4 MG SL tablet Place 1 tablet (0.4 mg total) under the tongue every 5 (five) minutes as needed for chest pain. 10 tablet 6  . Olopatadine HCl 0.2 % SOLN Apply 1 drop to eye daily. (Patient not taking: Reported on 06/23/2019) 2.5 mL 0   No facility-administered medications prior to visit.    Allergies  Allergen Reactions  . Oxycodone-Acetaminophen Other (See Comments)    Insomnia, so is able to take it Insomnia, so is able to take it   . Hydrocodone-Acetaminophen Itching    insomnia insomnia   . Penicillins Swelling    Extreme soreness Extreme soreness      ROS Review of Systems  Constitutional: Negative.   HENT: Negative.   Eyes: Negative for photophobia and visual disturbance.  Respiratory: Negative.   Cardiovascular: Negative.   Gastrointestinal: Negative.   Endocrine: Negative for polyphagia and polyuria.  Genitourinary: Negative.   Musculoskeletal: Negative for gait problem and joint swelling.  Allergic/Immunologic: Negative for immunocompromised state.  Neurological: Negative for light-headedness and numbness.  Hematological: Does not bruise/bleed easily.  Psychiatric/Behavioral: Negative.    Depression screen [facility] 2/9 09/01/2019 10/19/2018 07/30/2018  Decreased Interest 0 0 0  Down, Depressed, Hopeless 0 0 0  PHQ - 2 Score 0 0 0  Altered sleeping - 3 1  Tired, decreased energy - 1 1  Change in appetite - 3 0  Feeling bad or failure about yourself  - 0 0  Trouble concentrating - 0 0  Moving slowly or fidgety/restless - 0 0  Suicidal thoughts - 0 0  PHQ-9 Score - 7 2      Objective:    Physical Exam  Constitutional: He is oriented to person, place, and time. He appears well-developed and well-nourished. No distress.  HENT:  Head: Normocephalic and atraumatic.  Right Ear: External ear normal.  Left Ear: External ear normal.  Eyes: Conjunctivae are normal. Right eye exhibits no discharge. Left eye exhibits no discharge. No scleral icterus.  Neck: No JVD present. No tracheal deviation present. No thyromegaly present.  Cardiovascular: Normal rate and normal heart sounds.  Pulmonary/Chest: Effort normal and breath sounds normal. No stridor.  Abdominal: Bowel sounds are normal. He exhibits no distension. There is no abdominal tenderness. There is no rebound and no guarding.  Genitourinary: Rectum:     Guaiac result negative.     No rectal mass, anal fissure, tenderness, external hemorrhoid, internal hemorrhoid or abnormal anal tone.  Prostate is enlarged. Prostate is not tender.  Musculoskeletal:        General: No  edema.  Lymphadenopathy:    He has no cervical adenopathy.  Neurological: He is alert and oriented to person, place, and time.  Skin: Skin is warm and dry. He is not diaphoretic.     Psychiatric: He has a normal mood and affect. His behavior is normal.    BP 136/70   Pulse 71   Temp (!) 96.1 F (35.6 C) (Tympanic)   Ht 5\' 4"  (1.626 m)   Wt 222 lb (100.7 kg)   SpO2 92%   BMI 38.11 kg/m  Wt Readings from Last 3 Encounters:  09/01/19  222 lb (100.7 kg)  06/23/19 219 lb (99.3 kg)  06/09/19 219 lb 11.2 oz (99.7 kg)     Health Maintenance Due  Topic Date Due  . Hepatitis C Screening  Never done  . PNA vac Low Risk Adult (2 of 2 - PCV13) 03/27/2019    There are no preventive care reminders to display for this patient.  Lab Results  Component Value Date   TSH 1.16 03/29/2018   Lab Results  Component Value Date   WBC 7.1 02/14/2019   HGB 14.4 02/14/2019   HCT 42.5 02/14/2019   MCV 90.0 02/14/2019   PLT 161.0 02/14/2019   Lab Results  Component Value Date   NA 140 02/14/2019   K 3.8 02/14/2019   CO2 25 02/14/2019   GLUCOSE 183 (H) 02/14/2019   BUN 18 02/14/2019   CREATININE 0.99 02/14/2019   BILITOT 0.5 10/21/2018   ALKPHOS 89 10/21/2018   AST 17 10/21/2018   ALT 38 10/21/2018   PROT 6.5 10/21/2018   ALBUMIN 4.3 10/21/2018   CALCIUM 9.1 02/14/2019   GFR 75.56 02/14/2019   Lab Results  Component Value Date   CHOL 157 10/21/2018   Lab Results  Component Value Date   HDL 39.20 10/21/2018   No results found for: [facility] Lab Results  Component Value Date   TRIG 279.0 (H) 10/21/2018   Lab Results  Component Value Date   CHOLHDL 4 10/21/2018   No results found for: HGBA1C    Assessment & Plan:   Problem List Items Addressed This Visit      Cardiovascular and Mediastinum   Essential hypertension   Relevant Orders   Basic metabolic panel   CBC   Atherosclerosis of native coronary artery of native heart without angina pectoris   Relevant Orders     CBC   Hepatic function panel   LDL cholesterol, direct   Lipid panel     Other   Elevated LDL cholesterol level   Relevant Orders   Hepatic function panel   LDL cholesterol, direct   Lipid panel   Vitamin D deficiency   Relevant Orders   VITAMIN D 25 Hydroxy (Vit-D Deficiency, Fractures)   Elevated glucose   Relevant Orders   Basic metabolic panel   Hemoglobin A1c   Healthcare maintenance - Primary   Relevant Orders   Basic metabolic panel   Urinalysis, Routine w reflex microscopic   PSA      No orders of the defined types were placed in this encounter.   Follow-up: Return in about 6 months (around 03/02/2020).   Patient given information on health maintenance disease prevention as well as exercising to lose weight.  Asked him to continue to work towards his goal of 10,000 steps 5 days weekly.  Agree with starting strength training to assist with his weight loss efforts. [name], MD

## 2019-09-02 ENCOUNTER — Other Ambulatory Visit: Payer: Self-pay | Admitting: Family Medicine

## 2019-09-02 ENCOUNTER — Other Ambulatory Visit (INDEPENDENT_AMBULATORY_CARE_PROVIDER_SITE_OTHER): Payer: BC Managed Care – PPO

## 2019-09-02 ENCOUNTER — Telehealth: Payer: Self-pay | Admitting: Family Medicine

## 2019-09-02 DIAGNOSIS — E7800 Pure hypercholesterolemia, unspecified: Secondary | ICD-10-CM | POA: Diagnosis not present

## 2019-09-02 DIAGNOSIS — I10 Essential (primary) hypertension: Secondary | ICD-10-CM

## 2019-09-02 DIAGNOSIS — I2510 Atherosclerotic heart disease of native coronary artery without angina pectoris: Secondary | ICD-10-CM

## 2019-09-02 LAB — LIPID PANEL
Cholesterol: 148 mg/dL (ref 0–200)
HDL: 38.4 mg/dL — ABNORMAL LOW (ref >39.00)
NonHDL: 109.72
Total CHOL/HDL Ratio: 4
Triglycerides: 233 mg/dL — ABNORMAL HIGH (ref 0.0–149.0)
VLDL: 46.6 mg/dL — ABNORMAL HIGH (ref 0.0–40.0)

## 2019-09-02 LAB — LDL CHOLESTEROL, DIRECT: Direct LDL: 83 mg/dL

## 2019-09-02 NOTE — Telephone Encounter (Signed)
Patient called to see if his lab results were ready. Please call him when lab results are received.

## 2019-09-05 NOTE — Telephone Encounter (Signed)
Patient aware of labs and recommendations

## 2019-09-19 DIAGNOSIS — J449 Chronic obstructive pulmonary disease, unspecified: Secondary | ICD-10-CM | POA: Diagnosis not present

## 2019-09-19 DIAGNOSIS — J432 Centrilobular emphysema: Secondary | ICD-10-CM | POA: Diagnosis not present

## 2019-09-26 DIAGNOSIS — Z713 Dietary counseling and surveillance: Secondary | ICD-10-CM | POA: Diagnosis not present

## 2019-10-08 ENCOUNTER — Other Ambulatory Visit: Payer: Self-pay | Admitting: Pulmonary Disease

## 2019-10-15 ENCOUNTER — Other Ambulatory Visit: Payer: Self-pay | Admitting: Family Medicine

## 2019-10-15 DIAGNOSIS — E7800 Pure hypercholesterolemia, unspecified: Secondary | ICD-10-CM

## 2019-10-20 DIAGNOSIS — J432 Centrilobular emphysema: Secondary | ICD-10-CM | POA: Diagnosis not present

## 2019-10-20 DIAGNOSIS — J449 Chronic obstructive pulmonary disease, unspecified: Secondary | ICD-10-CM | POA: Diagnosis not present

## 2019-10-24 ENCOUNTER — Other Ambulatory Visit: Payer: Self-pay | Admitting: Family Medicine

## 2019-10-24 DIAGNOSIS — G2581 Restless legs syndrome: Secondary | ICD-10-CM

## 2019-11-01 DIAGNOSIS — Z713 Dietary counseling and surveillance: Secondary | ICD-10-CM | POA: Diagnosis not present

## 2019-11-17 ENCOUNTER — Telehealth: Payer: Self-pay | Admitting: Dermatology

## 2019-11-19 DIAGNOSIS — J432 Centrilobular emphysema: Secondary | ICD-10-CM | POA: Diagnosis not present

## 2019-11-19 DIAGNOSIS — J449 Chronic obstructive pulmonary disease, unspecified: Secondary | ICD-10-CM | POA: Diagnosis not present

## 2019-11-21 ENCOUNTER — Other Ambulatory Visit: Payer: Self-pay

## 2019-11-21 ENCOUNTER — Encounter: Payer: Self-pay | Admitting: Dermatology

## 2019-11-21 ENCOUNTER — Ambulatory Visit: Payer: BC Managed Care – PPO | Admitting: Dermatology

## 2019-11-21 DIAGNOSIS — D229 Melanocytic nevi, unspecified: Secondary | ICD-10-CM

## 2019-11-21 DIAGNOSIS — I872 Venous insufficiency (chronic) (peripheral): Secondary | ICD-10-CM | POA: Diagnosis not present

## 2019-11-21 DIAGNOSIS — D225 Melanocytic nevi of trunk: Secondary | ICD-10-CM

## 2019-11-21 DIAGNOSIS — L821 Other seborrheic keratosis: Secondary | ICD-10-CM | POA: Diagnosis not present

## 2019-11-21 NOTE — Progress Notes (Signed)
   New Patient   Subjective  Justin Boyd is a 69 y.o. male who presents for the following: Annual Exam.  General skin check Location:  Duration:  Quality:  Associated Signs/Symptoms: Modifying Factors:  Severity:  Timing: Context: No known history of skin cancer   The following portions of the chart were reviewed this encounter and updated as appropriate: Tobacco  Allergies  Meds  Problems  Med Hx  Surg Hx  Fam Hx      Objective  Well appearing patient in no apparent distress; mood and affect are within normal limits.  A full examination was performed including scalp, head, eyes, ears, nose, lips, neck, chest, axillae, abdomen, back, buttocks, bilateral upper extremities, bilateral lower extremities, hands, feet, fingers, toes, fingernails, and toenails. All findings within normal limits unless otherwise noted below.   Assessment & Plan  Venous stasis dermatitis of right lower extremity Right Lower Leg - Anterior  Discussed techniques to minimize venous pressure  Seborrheic keratosis (3) Neck - Anterior; Left Upper Back; Left Supraorbital Region  Leave if stable  Nevus Mid Back  Self examination with spouse twice annually First visit for Justin Boyd date of birth Oct 29, 1950.  He had a growth on the right temple which spontaneously cleared and was likely a benign keratosis; examination showed no residual but of course I will recheck the area if it recurs.  General skin examination from legs to scalp showed no atypical moles or melanoma or skin cancer.  He does have a moderate number of benign textured tan seborrheic keratoses the largest ones being on the right back and the right inner collarbone.  A smaller lightly pigmented SK is on the left upper eyelid.  Smallest ones are on the tops of the hands and the backs of the legs.  These do not require watching or freezing or removal.  He has a dozen 2 mm red dot angiomas on the torso which are benign.  He has  some tendency to get skin tags on rub areas like the neck and under the arm.  We spent some time discussing his moderately swollen lower legs with speckled brown pigment of hemosiderosis.  I discussed ways to minimize venous pressure.  Should he develop an itchy rash he can call me and we will prescribed by phone a topical corticoid.  Routine follow-up in 1 year.

## 2019-11-22 ENCOUNTER — Encounter: Payer: Self-pay | Admitting: Dermatology

## 2019-11-26 ENCOUNTER — Encounter: Payer: Self-pay | Admitting: Dermatology

## 2019-12-02 ENCOUNTER — Other Ambulatory Visit: Payer: Self-pay | Admitting: Family Medicine

## 2019-12-02 ENCOUNTER — Ambulatory Visit: Payer: BC Managed Care – PPO | Admitting: Family Medicine

## 2019-12-02 DIAGNOSIS — I2510 Atherosclerotic heart disease of native coronary artery without angina pectoris: Secondary | ICD-10-CM

## 2019-12-02 DIAGNOSIS — I10 Essential (primary) hypertension: Secondary | ICD-10-CM

## 2019-12-02 NOTE — Telephone Encounter (Signed)
Last OV 09/01/19 Last fill 09/02/19  #90/1

## 2019-12-07 ENCOUNTER — Ambulatory Visit: Payer: BC Managed Care – PPO | Admitting: Family Medicine

## 2019-12-20 DIAGNOSIS — J449 Chronic obstructive pulmonary disease, unspecified: Secondary | ICD-10-CM | POA: Diagnosis not present

## 2019-12-20 DIAGNOSIS — J432 Centrilobular emphysema: Secondary | ICD-10-CM | POA: Diagnosis not present

## 2019-12-26 ENCOUNTER — Encounter: Payer: Self-pay | Admitting: Family Medicine

## 2019-12-26 ENCOUNTER — Other Ambulatory Visit: Payer: Self-pay

## 2019-12-26 ENCOUNTER — Ambulatory Visit: Payer: BC Managed Care – PPO | Admitting: Family Medicine

## 2019-12-26 VITALS — BP 142/78 | HR 69 | Temp 97.7°F | Ht 64.0 in | Wt 228.6 lb

## 2019-12-26 DIAGNOSIS — I2510 Atherosclerotic heart disease of native coronary artery without angina pectoris: Secondary | ICD-10-CM | POA: Diagnosis not present

## 2019-12-26 DIAGNOSIS — E559 Vitamin D deficiency, unspecified: Secondary | ICD-10-CM

## 2019-12-26 DIAGNOSIS — E7800 Pure hypercholesterolemia, unspecified: Secondary | ICD-10-CM | POA: Diagnosis not present

## 2019-12-26 DIAGNOSIS — I10 Essential (primary) hypertension: Secondary | ICD-10-CM | POA: Diagnosis not present

## 2019-12-26 LAB — CBC
HCT: 43.7 % (ref 39.0–52.0)
Hemoglobin: 14.7 g/dL (ref 13.0–17.0)
MCHC: 33.7 g/dL (ref 30.0–36.0)
MCV: 89.8 fl (ref 78.0–100.0)
Platelets: 149 10*3/uL — ABNORMAL LOW (ref 150.0–400.0)
RBC: 4.87 Mil/uL (ref 4.22–5.81)
RDW: 14.4 % (ref 11.5–15.5)
WBC: 6.6 10*3/uL (ref 4.0–10.5)

## 2019-12-26 LAB — COMPREHENSIVE METABOLIC PANEL
ALT: 36 U/L (ref 0–53)
AST: 20 U/L (ref 0–37)
Albumin: 4.2 g/dL (ref 3.5–5.2)
Alkaline Phosphatase: 77 U/L (ref 39–117)
BUN: 16 mg/dL (ref 6–23)
CO2: 25 mEq/L (ref 19–32)
Calcium: 9.2 mg/dL (ref 8.4–10.5)
Chloride: 104 mEq/L (ref 96–112)
Creatinine, Ser: 1.08 mg/dL (ref 0.40–1.50)
GFR: 68.16 mL/min (ref >60.00)
Glucose, Bld: 103 mg/dL — ABNORMAL HIGH (ref 70–99)
Potassium: 4.2 mEq/L (ref 3.5–5.1)
Sodium: 140 mEq/L (ref 135–145)
Total Bilirubin: 0.7 mg/dL (ref 0.2–1.2)
Total Protein: 6.5 g/dL (ref 6.0–8.3)

## 2019-12-26 LAB — LIPID PANEL
Cholesterol: 141 mg/dL (ref 0–200)
HDL: 32.7 mg/dL — ABNORMAL LOW (ref >39.00)
NonHDL: 108.61
Total CHOL/HDL Ratio: 4
Triglycerides: 262 mg/dL — ABNORMAL HIGH (ref 0.0–149.0)
VLDL: 52.4 mg/dL — ABNORMAL HIGH (ref 0.0–40.0)

## 2019-12-26 LAB — LDL CHOLESTEROL, DIRECT: Direct LDL: 83 mg/dL

## 2019-12-26 LAB — VITAMIN D 25 HYDROXY (VIT D DEFICIENCY, FRACTURES): VITD: 62.19 ng/mL (ref 30.00–100.00)

## 2019-12-26 MED ORDER — AMLODIPINE BESYLATE 5 MG PO TABS: 5.0000 mg | ORAL_TABLET | Freq: Every day | ORAL | 0 refills | Status: DC

## 2019-12-26 NOTE — Patient Instructions (Signed)
Calorie Counting for Weight Loss Calories are units of energy. Your body needs a certain amount of calories from food to keep you going throughout the day. When you eat more calories than your body needs, your body stores the extra calories as fat. When you eat fewer calories than your body needs, your body burns fat to get the energy it needs. Calorie counting means keeping track of how many calories you eat and drink each day. Calorie counting can be helpful if you need to lose weight. If you make sure to eat fewer calories than your body needs, you should lose weight. Ask your health care provider what a healthy weight is for you. For calorie counting to work, you will need to eat the right number of calories in a day in order to lose a healthy amount of weight per week. A dietitian can help you determine how many calories you need in a day and will give you suggestions on how to reach your calorie goal.  A healthy amount of weight to lose per week is usually 1-2 lb (0.5-0.9 kg). This usually means that your daily calorie intake should be reduced by 500-750 calories.  Eating 1,200 - 1,500 calories per day can help most women lose weight.  Eating 1,500 - 1,800 calories per day can help most men lose weight. What is my plan? My goal is to have __________ calories per day. If I have this many calories per day, I should lose around __________ pounds per week. What do I need to know about calorie counting? In order to meet your daily calorie goal, you will need to:  Find out how many calories are in each food you would like to eat. Try to do this before you eat.  Decide how much of the food you plan to eat.  Write down what you ate and how many calories it had. Doing this is called keeping a food log. To successfully lose weight, it is important to balance calorie counting with a healthy lifestyle that includes regular activity. Aim for 150 minutes of moderate exercise (such as walking) or 75  minutes of vigorous exercise (such as running) each week. Where do I find calorie information?  The number of calories in a food can be found on a Nutrition Facts label. If a food does not have a Nutrition Facts label, try to look up the calories online or ask your dietitian for help. Remember that calories are listed per serving. If you choose to have more than one serving of a food, you will have to multiply the calories per serving by the amount of servings you plan to eat. For example, the label on a package of bread might say that a serving size is 1 slice and that there are 90 calories in a serving. If you eat 1 slice, you will have eaten 90 calories. If you eat 2 slices, you will have eaten 180 calories. How do I keep a food log? Immediately after each meal, record the following information in your food log:  What you ate. Don't forget to include toppings, sauces, and other extras on the food.  How much you ate. This can be measured in cups, ounces, or number of items.  How many calories each food and drink had.  The total number of calories in the meal. Keep your food log near you, such as in a small notebook in your pocket, or use a mobile app or website. Some programs will calculate   calories for you and show you how many calories you have left for the day to meet your goal. What are some calorie counting tips?   Use your calories on foods and drinks that will fill you up and not leave you hungry: ? Some examples of foods that fill you up are nuts and nut butters, vegetables, lean proteins, and high-fiber foods like whole grains. High-fiber foods are foods with more than 5 g fiber per serving. ? Drinks such as sodas, specialty coffee drinks, alcohol, and juices have a lot of calories, yet do not fill you up.  Eat nutritious foods and avoid empty calories. Empty calories are calories you get from foods or beverages that do not have many vitamins or protein, such as candy, sweets, and  soda. It is better to have a nutritious high-calorie food (such as an avocado) than a food with few nutrients (such as a bag of chips).  Know how many calories are in the foods you eat most often. This will help you calculate calorie counts faster.  Pay attention to calories in drinks. Low-calorie drinks include water and unsweetened drinks.  Pay attention to nutrition labels for "low fat" or "fat free" foods. These foods sometimes have the same amount of calories or more calories than the full fat versions. They also often have added sugar, starch, or salt, to make up for flavor that was removed with the fat.  Find a way of tracking calories that works for you. Get creative. Try different apps or programs if writing down calories does not work for you. What are some portion control tips?  Know how many calories are in a serving. This will help you know how many servings of a certain food you can have.  Use a measuring cup to measure serving sizes. You could also try weighing out portions on a kitchen scale. With time, you will be able to estimate serving sizes for some foods.  Take some time to put servings of different foods on your favorite plates, bowls, and cups so you know what a serving looks like.  Try not to eat straight from a bag or box. Doing this can lead to overeating. Put the amount you would like to eat in a cup or on a plate to make sure you are eating the right portion.  Use smaller plates, glasses, and bowls to prevent overeating.  Try not to multitask (for example, watch TV or use your computer) while eating. If it is time to eat, sit down at a table and enjoy your food. This will help you to know when you are full. It will also help you to be aware of what you are eating and how much you are eating. What are tips for following this plan? Reading food labels  Check the calorie count compared to the serving size. The serving size may be smaller than what you are used to  eating.  Check the source of the calories. Make sure the food you are eating is high in vitamins and protein and low in saturated and trans fats. Shopping  Read nutrition labels while you shop. This will help you make healthy decisions before you decide to purchase your food.  Make a grocery list and stick to it. Cooking  Try to cook your favorite foods in a healthier way. For example, try baking instead of frying.  Use low-fat dairy products. Meal planning  Use more fruits and vegetables. Half of your plate should be fruits   and vegetables.  Include lean proteins like poultry and fish. How do I count calories when eating out?  Ask for smaller portion sizes.  Consider sharing an entree and sides instead of getting your own entree.  If you get your own entree, eat only half. Ask for a box at the beginning of your meal and put the rest of your entree in it so you are not tempted to eat it.  If calories are listed on the menu, choose the lower calorie options.  Choose dishes that include vegetables, fruits, whole grains, low-fat dairy products, and lean protein.  Choose items that are boiled, broiled, grilled, or steamed. Stay away from items that are buttered, battered, fried, or served with cream sauce. Items labeled "crispy" are usually fried, unless stated otherwise.  Choose water, low-fat milk, unsweetened iced tea, or other drinks without added sugar. If you want an alcoholic beverage, choose a lower calorie option such as a glass of wine or light beer.  Ask for dressings, sauces, and syrups on the side. These are usually high in calories, so you should limit the amount you eat.  If you want a salad, choose a garden salad and ask for grilled meats. Avoid extra toppings like bacon, cheese, or fried items. Ask for the dressing on the side, or ask for olive oil and vinegar or lemon to use as dressing.  Estimate how many servings of a food you are given. For example, a serving of  cooked rice is  cup or about the size of half a baseball. Knowing serving sizes will help you be aware of how much food you are eating at restaurants. The list below tells you how big or small some common portion sizes are based on everyday objects: ? 1 oz--4 stacked dice. ? 3 oz--1 deck of cards. ? 1 tsp--1 die. ? 1 Tbsp-- a ping-pong ball. ? 2 Tbsp--1 ping-pong ball. ?  cup-- baseball. ? 1 cup--1 baseball. Summary  Calorie counting means keeping track of how many calories you eat and drink each day. If you eat fewer calories than your body needs, you should lose weight.  A healthy amount of weight to lose per week is usually 1-2 lb (0.5-0.9 kg). This usually means reducing your daily calorie intake by 500-750 calories.  The number of calories in a food can be found on a Nutrition Facts label. If a food does not have a Nutrition Facts label, try to look up the calories online or ask your dietitian for help.  Use your calories on foods and drinks that will fill you up, and not on foods and drinks that will leave you hungry.  Use smaller plates, glasses, and bowls to prevent overeating. This information is not intended to replace advice given to you by your health care provider. Make sure you discuss any questions you have with your health care provider. Document Revised: 01/08/2018 Document Reviewed: 03/21/2016 Elsevier Patient Education  [employer]. Amlodipine Oral Tablets What is this medicine? AMLODIPINE (am LOE di peen) is a calcium channel blocker. It relaxes your blood vessels and decreases the amount of work the heart has to do. It treats high blood pressure and/or prevents chest pain (also called angina). This medicine may be used for other purposes; ask your health care provider or pharmacist if you have questions. COMMON BRAND NAME(S): Norvasc What should I tell my health care provider before I take this medicine? They need to know if you have any of these  conditions:  heart disease  liver disease  an unusual or allergic reaction to amlodipine, other drugs, foods, dyes, or preservatives  pregnant or trying to get pregnant  breast-feeding How should I use this medicine? Take this drug by mouth. Take it as directed on the prescription label at the same time every day. You can take it with or without food. If it upsets your stomach, take it with food. Keep taking it unless your health care provider tells you to stop. Talk to your health care provider about the use of this drug in children. While it may be prescribed for children as young as 6 for selected conditions, precautions do apply. Overdosage: If you think you have taken too much of this medicine contact a poison control center or emergency room at once. NOTE: This medicine is only for you. Do not share this medicine with others. What if I miss a dose? If you miss a dose, take it as soon as you can. If it is almost time for your next dose, take only that dose. Do not take double or extra doses. What may interact with this medicine? This medicine may interact with the following medications:  clarithromycin  cyclosporine  diltiazem  itraconazole  simvastatin  tacrolimus This list may not describe all possible interactions. Give your health care provider a list of all the medicines, herbs, non-prescription drugs, or dietary supplements you use. Also tell them if you smoke, drink alcohol, or use illegal drugs. Some items may interact with your medicine. What should I watch for while using this medicine? Visit your health care provider for regular checks on your progress. Check your blood pressure as directed. Ask your health care provider what your blood pressure should be. Also, find out when you should contact him or her. Do not treat yourself for coughs, colds, or pain while you are using this drug without asking your health care provider for advice. Some drugs may increase your  blood pressure. You may get drowsy or dizzy. Do not drive, use machinery, or do anything that needs mental alertness until you know how this drug affects you. Do not stand up or sit up quickly, especially if you are an older patient. This reduces the risk of dizzy or fainting spells. What side effects may I notice from receiving this medicine? Side effects that you should report to your doctor or health care provider as soon as possible:  allergic reactions (skin rash, itching or hives; swelling of the face, lips, or tongue)  heart attack (trouble breathing; pain or tightness in the chest, neck, back or arms; unusually weak or tired)  low blood pressure (dizziness; feeling faint or lightheaded, falls; unusually weak or tired) Side effects that usually do not require medical attention (report these to your doctor or health care provider if they continue or are bothersome):  facial flushing  nausea  palpitations  stomach pain  sudden weight gain  swelling of the ankles, feet, hands This list may not describe all possible side effects. Call your doctor for medical advice about side effects. You may report side effects to FDA at 1-800-FDA-1088. Where should I keep my medicine? Keep out of the reach of children and pets. Store at room temperature between 59 and 86 degrees F (15 and 30 degrees C). Protect from light and moisture. Keep the container tightly closed. Throw away any unused drug after the expiration date. NOTE: This sheet is a summary. It may not cover all possible information. If you  have questions about this medicine, talk to your doctor, pharmacist, or health care provider.  2020 Elsevier/Gold Standard (2019-01-25 19:39:45)

## 2019-12-26 NOTE — Progress Notes (Addendum)
Established Patient Office Visit  Subjective:  Patient ID: Justin Boyd, male    DOB: 1950-12-27  Age: 69 y.o. MRN: [id number]  CC:  Chief Complaint  Patient presents with  . Follow-up    3 month follow up on BP, no concerns.     HPI Justin Boyd presents for follow-up of hypertension, elevated cholesterol, vitamin D deficiency and obesity.  Unfortunately has gained some weight.  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  Blood pressure is elevated.  He is taking high-dose vitamin D, Crestor and fish oil.  Unable to exercise due to the humidity.  Past Medical History:  Diagnosis Date  . Chicken pox   . Hyperlipidemia     Past Surgical History:  Procedure Laterality Date  . carpal tunnel both hands  2010  . SHOULDER SURGERY Right 2016    Family History  Problem Relation Age of Onset  . Hypertension Mother   . Stroke Mother     Social History   Socioeconomic History  . Marital status: Married    Spouse name: Not on file  . Number of children: Not on file  . Years of education: Not on file  . Highest education level: Not on file  Occupational History  . Not on file  Tobacco Use  . Smoking status: Former Smoker    Packs/day: 1.00    Years: 45.00    Pack years: 45.00    Types: Cigarettes    Quit date: 05/05/2018    Years since quitting: 1.6  . Smokeless tobacco: Never Used  Vaping Use  . Vaping Use: Never used  Substance and Sexual Activity  . Alcohol use: Yes    Comment: 2 cans of beer a week  . Drug use: Never  . Sexual activity: Yes    Partners: Female  Other Topics Concern  . Not on file  Social History Narrative  . Not on file   Social Determinants of Health   Financial Resource Strain:   . Difficulty of Paying Living Expenses: Not on file  Food Insecurity:   . Worried About Charity fundraiser in the Last Year: Not on file  . Ran Out of Food in the Last Year: Not on file    Transportation Needs:   . Lack of Transportation (Medical): Not on file  . Lack of Transportation (Non-Medical): Not on file  Physical Activity:   . Days of Exercise per Week: Not on file  . Minutes of Exercise per Session: Not on file  Stress:   . Feeling of Stress : Not on file  Social Connections:   . Frequency of Communication with Friends and Family: Not on file  . Frequency of Social Gatherings with Friends and Family: Not on file  . Attends Religious Services: Not on file  . Active Member of Clubs or Organizations: Not on file  . Attends Archivist Meetings: Not on file  . Marital Status: Not on file  Intimate Partner Violence:   . Fear of Current or Ex-Partner: Not on file  . Emotionally Abused: Not on file  . Physically Abused: Not on file  . Sexually Abused: Not on file    Outpatient Medications Prior to Visit  Medication Sig Dispense Refill  . albuterol (PROVENTIL HFA;VENTOLIN HFA) 108 (90 Base) MCG/ACT inhaler Inhale 2 puffs into the lungs every 4 (four) hours as needed for wheezing or shortness of breath. 1  Inhaler 5  . gabapentin (NEURONTIN) 300 MG capsule TAKE 1 CAPSULE(300 MG) BY MOUTH THREE TIMES DAILY 90 capsule 3  . Melatonin 5 MG CHEW Chew by mouth.    . metoprolol succinate (TOPROL-XL) 50 MG 24 hr tablet TAKE 1 TABLET(50 MG) BY MOUTH DAILY WITH OR IMMEDIATELY FOLLOWING A MEAL 90 tablet 0  . pantoprazole (PROTONIX) 40 MG tablet Take 40 mg by mouth daily.    . TRELEGY ELLIPTA 100-62.5-25 MCG/INH AEPB INHALE 1 PUFF INTO THE LUNGS DAILY 180 each 1  . rosuvastatin (CRESTOR) 10 MG tablet TAKE 1 TABLET(10 MG) BY MOUTH DAILY 90 tablet 0  . nitroGLYCERIN (NITROSTAT) 0.4 MG SL tablet Place 1 tablet (0.4 mg total) under the tongue every 5 (five) minutes as needed for chest pain. 10 tablet 6   No facility-administered medications prior to visit.    Allergies  Allergen Reactions  . Oxycodone-Acetaminophen Other (See Comments)    Insomnia, so is able to take  it Insomnia, so is able to take it   . Hydrocodone-Acetaminophen Itching    insomnia insomnia   . Penicillins Swelling    Extreme soreness Extreme soreness     ROS Review of Systems  Constitutional: Negative.   Respiratory: Negative.   Cardiovascular: Negative.   Gastrointestinal: Negative.   Endocrine: Negative for polyphagia and polyuria.  Genitourinary: Negative.   Musculoskeletal: Negative for gait problem and joint swelling.  Neurological: Negative for tremors and speech difficulty.  Hematological: Does not bruise/bleed easily.  Psychiatric/Behavioral: Negative.       Objective:    Physical Exam Vitals and nursing note reviewed.  Constitutional:      Appearance: Normal appearance. He is obese.  HENT:     Head: Normocephalic and atraumatic.     Right Ear: External ear normal.     Left Ear: External ear normal.  Eyes:     General: No scleral icterus.       Right eye: No discharge.        Left eye: No discharge.     Conjunctiva/sclera: Conjunctivae normal.  Cardiovascular:     Rate and Rhythm: Normal rate and regular rhythm.  Pulmonary:     Breath sounds: Normal breath sounds.  Musculoskeletal:     Cervical back: No rigidity or tenderness.     Right lower leg: No edema.     Left lower leg: No edema.  Lymphadenopathy:     Cervical: No cervical adenopathy.  Skin:    General: Skin is warm and dry.  Neurological:     Mental Status: He is alert and oriented to person, place, and time.  Psychiatric:        Mood and Affect: Mood normal.        Behavior: Behavior normal.     BP (!) 142/78   Pulse 69   Temp 97.7 F (36.5 C) (Tympanic)   Ht 5\' 4"  (1.626 m)   Wt 228 lb 9.6 oz (103.7 kg)   SpO2 94%   BMI 39.24 kg/m  Wt Readings from Last 3 Encounters:  12/26/19 228 lb 9.6 oz (103.7 kg)  09/01/19 222 lb (100.7 kg)  06/23/19 219 lb (99.3 kg)     Health Maintenance Due  Topic Date Due  . Hepatitis C Screening  Never done  . INFLUENZA VACCINE   12/04/2019    There are no preventive care reminders to display for this patient.  Lab Results  Component Value Date   TSH 1.16 03/29/2018   Lab Results  Component Value Date   WBC 6.6 12/26/2019   HGB 14.7 12/26/2019   HCT 43.7 12/26/2019   MCV 89.8 12/26/2019   PLT 149.0 (L) 12/26/2019   Lab Results  Component Value Date   NA 140 12/26/2019   K 4.2 12/26/2019   CO2 25 12/26/2019   GLUCOSE 103 (H) 12/26/2019   BUN 16 12/26/2019   CREATININE 1.08 12/26/2019   BILITOT 0.7 12/26/2019   ALKPHOS 77 12/26/2019   AST 20 12/26/2019   ALT 36 12/26/2019   PROT 6.5 12/26/2019   ALBUMIN 4.2 12/26/2019   CALCIUM 9.2 12/26/2019   GFR 68.16 12/26/2019   Lab Results  Component Value Date   CHOL 141 12/26/2019   Lab Results  Component Value Date   HDL 32.70 (L) 12/26/2019   No results found for: [facility] Lab Results  Component Value Date   TRIG 262.0 (H) 12/26/2019   Lab Results  Component Value Date   CHOLHDL 4 12/26/2019   Lab Results  Component Value Date   HGBA1C 5.8 09/01/2019      Assessment & Plan:   Problem List Items Addressed This Visit      Cardiovascular and Mediastinum   Essential hypertension   Relevant Medications   amLODipine (NORVASC) 5 MG tablet   rosuvastatin (CRESTOR) 20 MG tablet   Other Relevant Orders   Comprehensive metabolic panel (Completed)   CBC (Completed)   Atherosclerosis of native coronary artery of native heart without angina pectoris   Relevant Medications   amLODipine (NORVASC) 5 MG tablet   rosuvastatin (CRESTOR) 20 MG tablet   Other Relevant Orders   Comprehensive metabolic panel (Completed)   LDL cholesterol, direct (Completed)   Lipid panel (Completed)     Other   Elevated LDL cholesterol level   Relevant Medications   rosuvastatin (CRESTOR) 20 MG tablet   Other Relevant Orders   LDL cholesterol, direct (Completed)   Lipid panel (Completed)   Vitamin D deficiency - Primary   Relevant Orders   VITAMIN D 25  Hydroxy (Vit-D Deficiency, Fractures) (Completed)      Meds ordered this encounter  Medications  . amLODipine (NORVASC) 5 MG tablet    Sig: Take 1 tablet (5 mg total) by mouth daily.    Dispense:  90 tablet    Refill:  0  . rosuvastatin (CRESTOR) 20 MG tablet    Sig: Take 1 tablet (20 mg total) by mouth daily.    Dispense:  90 tablet    Refill:  3    Follow-up: Return in about 3 months (around 03/27/2020), or Have added Norvasc for blood pressure control. May need to increase crestor..   Suggested weight watchers.  Given information on calorie counting to lose weight.  May need to increase Crestor.  Have added amlodipine for blood pressure control. [name], MD

## 2019-12-27 MED ORDER — ROSUVASTATIN CALCIUM 20 MG PO TABS: 20.0000 mg | ORAL_TABLET | Freq: Every day | ORAL | 3 refills | Status: DC

## 2019-12-27 NOTE — Addendum Note (Signed)
Addended by: Jon Billings on: 12/27/2019 07:27 AM   Modules accepted: Orders

## 2020-01-13 ENCOUNTER — Other Ambulatory Visit: Payer: Self-pay | Admitting: Family Medicine

## 2020-01-13 DIAGNOSIS — E7800 Pure hypercholesterolemia, unspecified: Secondary | ICD-10-CM

## 2020-01-20 DIAGNOSIS — J449 Chronic obstructive pulmonary disease, unspecified: Secondary | ICD-10-CM | POA: Diagnosis not present

## 2020-01-20 DIAGNOSIS — J432 Centrilobular emphysema: Secondary | ICD-10-CM | POA: Diagnosis not present

## 2020-02-19 DIAGNOSIS — J432 Centrilobular emphysema: Secondary | ICD-10-CM | POA: Diagnosis not present

## 2020-02-19 DIAGNOSIS — J449 Chronic obstructive pulmonary disease, unspecified: Secondary | ICD-10-CM | POA: Diagnosis not present

## 2020-02-28 ENCOUNTER — Ambulatory Visit: Payer: BC Managed Care – PPO | Admitting: Family Medicine

## 2020-03-02 ENCOUNTER — Ambulatory Visit: Payer: BC Managed Care – PPO | Admitting: Family Medicine

## 2020-03-03 ENCOUNTER — Other Ambulatory Visit: Payer: Self-pay | Admitting: Family Medicine

## 2020-03-03 DIAGNOSIS — I10 Essential (primary) hypertension: Secondary | ICD-10-CM

## 2020-03-03 DIAGNOSIS — I2510 Atherosclerotic heart disease of native coronary artery without angina pectoris: Secondary | ICD-10-CM

## 2020-03-05 NOTE — Telephone Encounter (Signed)
Last OV 12/26/19 Last fill 12/02/19  #90/0

## 2020-03-06 ENCOUNTER — Ambulatory Visit: Payer: BC Managed Care – PPO | Admitting: Family Medicine

## 2020-03-21 DIAGNOSIS — J449 Chronic obstructive pulmonary disease, unspecified: Secondary | ICD-10-CM | POA: Diagnosis not present

## 2020-03-21 DIAGNOSIS — J432 Centrilobular emphysema: Secondary | ICD-10-CM | POA: Diagnosis not present

## 2020-03-23 ENCOUNTER — Other Ambulatory Visit: Payer: Self-pay | Admitting: Family Medicine

## 2020-03-23 DIAGNOSIS — I2510 Atherosclerotic heart disease of native coronary artery without angina pectoris: Secondary | ICD-10-CM

## 2020-03-23 DIAGNOSIS — I10 Essential (primary) hypertension: Secondary | ICD-10-CM

## 2020-03-23 NOTE — Telephone Encounter (Signed)
Last OV 12/26/19 Last fill 12/26/19  #90/0

## 2020-03-27 ENCOUNTER — Other Ambulatory Visit: Payer: Self-pay | Admitting: Family Medicine

## 2020-03-27 DIAGNOSIS — I10 Essential (primary) hypertension: Secondary | ICD-10-CM

## 2020-03-27 DIAGNOSIS — I2510 Atherosclerotic heart disease of native coronary artery without angina pectoris: Secondary | ICD-10-CM

## 2020-04-05 ENCOUNTER — Other Ambulatory Visit: Payer: Self-pay | Admitting: Family Medicine

## 2020-04-05 DIAGNOSIS — G2581 Restless legs syndrome: Secondary | ICD-10-CM

## 2020-04-05 NOTE — Telephone Encounter (Signed)
Last OV 12/26/19 Last fill 10/24/19  #90/3

## 2020-04-09 ENCOUNTER — Other Ambulatory Visit: Payer: Self-pay | Admitting: Pulmonary Disease

## 2020-04-13 ENCOUNTER — Encounter: Payer: Self-pay | Admitting: Pulmonary Disease

## 2020-04-13 ENCOUNTER — Ambulatory Visit: Payer: BC Managed Care – PPO | Admitting: Pulmonary Disease

## 2020-04-13 ENCOUNTER — Other Ambulatory Visit: Payer: Self-pay

## 2020-04-13 VITALS — BP 132/70 | HR 76 | Temp 97.5°F | Ht 64.0 in | Wt 224.4 lb

## 2020-04-13 DIAGNOSIS — J432 Centrilobular emphysema: Secondary | ICD-10-CM

## 2020-04-13 DIAGNOSIS — R0602 Shortness of breath: Secondary | ICD-10-CM | POA: Diagnosis not present

## 2020-04-13 MED ORDER — TRELEGY ELLIPTA 100-62.5-25 MCG/INH IN AEPB
1.0000 | INHALATION_SPRAY | Freq: Every day | RESPIRATORY_TRACT | 3 refills | Status: DC
Start: 2020-04-13 — End: 2020-08-13

## 2020-04-13 MED ORDER — ALBUTEROL SULFATE HFA 108 (90 BASE) MCG/ACT IN AERS: 2.0000 | INHALATION_SPRAY | RESPIRATORY_TRACT | 5 refills | Status: AC | PRN

## 2020-04-13 NOTE — Patient Instructions (Signed)
COPD with stable symptoms  Continue current inhalers We will send in a prescription for your rescue inhaler  Call with any significant concerns  Follow-up a year from now

## 2020-04-13 NOTE — Progress Notes (Signed)
Justin Boyd    [id number]    [birth year]  Primary Care Physician:Kremer, Mortimer Fries, MD  Referring Physician: [name], [place],   [id number]  Chief complaint:   History of COPD Stable since last visit  HPI:   Continues Trelegy Neck rescue inhaler use as needed Does get short of breath with moderate activity  Recently joined a gym to start exercising regularly  Reformed smoker Denies any chest pains or chest discomfort No significant cough or expectoration  He is part of the lung cancer screening program where he had a CT-CT was reviewed with patient showing evidence of emphysema  History of hypercholesterolemia  Exercise tolerance is not really significantly limited He states he can walk a couple of miles if he chooses to, was able to go further prior to recent shortness of breath  Does not feel acutely ill at present No chronic cough Activity level is maintained but does get occasionally winded with previously well-tolerated activities which he relates to his weight gain  Office work  Outpatient Encounter Medications as of 04/13/2020  Medication Sig  . albuterol (PROVENTIL HFA;VENTOLIN HFA) 108 (90 Base) MCG/ACT inhaler Inhale 2 puffs into the lungs every 4 (four) hours as needed for wheezing or shortness of breath.  Marland Kitchen amLODipine (NORVASC) 5 MG tablet TAKE 1 TABLET(5 MG) BY MOUTH DAILY  . gabapentin (NEURONTIN) 300 MG capsule TAKE 1 CAPSULE(300 MG) BY MOUTH THREE TIMES DAILY  . metoprolol succinate (TOPROL-XL) 50 MG 24 hr tablet TAKE 1 TABLET(50 MG) BY MOUTH DAILY WITH OR IMMEDIATELY FOLLOWING A MEAL  . pantoprazole (PROTONIX) 40 MG tablet Take 40 mg by mouth daily.  . rosuvastatin (CRESTOR) 20 MG tablet Take 1 tablet (20 mg total) by mouth daily.  . TRELEGY ELLIPTA 100-62.5-25 MCG/INH AEPB INHALE 1 PUFF INTO THE LUNGS DAILY  . nitroGLYCERIN (NITROSTAT) 0.4 MG SL tablet Place 1 tablet (0.4 mg total) under  the tongue every 5 (five) minutes as needed for chest pain.  . [DISCONTINUED] Melatonin 5 MG CHEW Chew by mouth.   No facility-administered encounter medications on file as of 04/13/2020.    Allergies as of 04/13/2020 - Review Complete 04/13/2020  Allergen Reaction Noted  . Oxycodone-acetaminophen Other (See Comments) 12/16/2010  . Hydrocodone-acetaminophen Itching 12/16/2010  . Penicillins Swelling 12/16/2010    Past Medical History:  Diagnosis Date  . Chicken pox   . Hyperlipidemia     Past Surgical History:  Procedure Laterality Date  . carpal tunnel both hands  2010  . SHOULDER SURGERY Right 2016    Family History  Problem Relation Age of Onset  . Hypertension Mother   . Stroke Mother     Social History   Socioeconomic History  . Marital status: Married    Spouse name: Not on file  . Number of children: Not on file  . Years of education: Not on file  . Highest education level: Not on file  Occupational History  . Not on file  Tobacco Use  . Smoking status: Former Smoker    Packs/day: 1.00    Years: 45.00    Pack years: 45.00    Types: Cigarettes    Quit date: 05/05/2018    Years since quitting: 1.9  . Smokeless tobacco: Never Used  Vaping Use  . Vaping Use: Never used  Substance and Sexual Activity  . Alcohol use: Yes    Comment: 2 cans of beer a week  .  Drug use: Never  . Sexual activity: Yes    Partners: Female  Other Topics Concern  . Not on file  Social History Narrative  . Not on file   Social Determinants of Health   Financial Resource Strain: Not on file  Food Insecurity: Not on file  Transportation Needs: Not on file  Physical Activity: Not on file  Stress: Not on file  Social Connections: Not on file  Intimate Partner Violence: Not on file    Review of Systems  Constitutional: Negative.   HENT: Negative.   Eyes: Negative.   Respiratory: Positive for shortness of breath. Negative for cough.   Cardiovascular: Negative for chest  pain, palpitations and leg swelling.  Gastrointestinal: Negative.   All other systems reviewed and are negative.   Vitals:   04/13/20 1202  BP: 132/70  Pulse: 76  Temp: (!) 97.5 F (36.4 C)  SpO2: 94%     Physical Exam Constitutional:      Appearance: He is well-developed. He is obese.  HENT:     Head: Normocephalic and atraumatic.  Eyes:     General:        Right eye: No discharge.        Left eye: No discharge.  Neck:     Thyroid: No thyromegaly.     Vascular: No JVD.     Trachea: No tracheal deviation.  Cardiovascular:     Rate and Rhythm: Normal rate and regular rhythm.     Heart sounds: Normal heart sounds. No murmur heard.   Pulmonary:     Effort: Pulmonary effort is normal. No respiratory distress.     Breath sounds: Normal breath sounds. No wheezing or rales.  Chest:     Chest wall: No tenderness.  Musculoskeletal:     Cervical back: No rigidity or tenderness.  Neurological:     Mental Status: He is alert.  Psychiatric:        Mood and Affect: Mood normal.    Data Reviewed: CT scan of the chest reviewed with the patient showing evidence of emphysema and small lung nodules   Pulmonary function study reviewed-severe obstructive disease with significant bronchodilator response  Assessment:  Chronic obstructive pulmonary disease .  Emphysema on CT scan of the chest .  PFT with obstructive lung disease with significant bronchodilator response  Shortness of breath .  Better with current treatment  Obesity .  Working on weight loss efforts   Abnormal PFT showing severe obstructive disease -We will continue bronchodilator treatments   Plan/Recommendations:  Encouraged to continue with Trelegy  Continue albuterol as needed  Graded exercise as tolerated  I will follow-up with him in about a year from now. Encouraged to call with any significant concerns   Sherrilyn Rist MD [facility] 04/13/2020, 12:11 PM  CC:  [name],*

## 2020-04-20 DIAGNOSIS — J449 Chronic obstructive pulmonary disease, unspecified: Secondary | ICD-10-CM | POA: Diagnosis not present

## 2020-04-20 DIAGNOSIS — J432 Centrilobular emphysema: Secondary | ICD-10-CM | POA: Diagnosis not present

## 2020-04-20 IMAGING — CT CT CHEST LUNG CANCER SCREENING LOW DOSE W/O CM
1 of 2 series · 10 of 40 positions shown, 13 images · non-contrast
Comparison: None.

CLINICAL DATA: Current smoker, 45 pack-year history.

EXAM:
CT CHEST WITHOUT CONTRAST LOW-DOSE FOR LUNG CANCER SCREENING
TECHNIQUE: Multidetector CT imaging of the chest was performed following the
standard protocol without IV contrast.

[ct lung segmentation data · axial · 0.83mm/px · z∈[-390,-390]mm · 10 of 327 frames shown]
[frame 1/327  mediastinal]
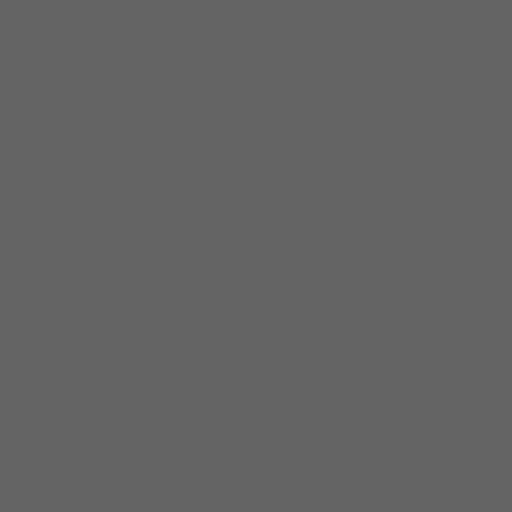
[frame 1/327  lung]
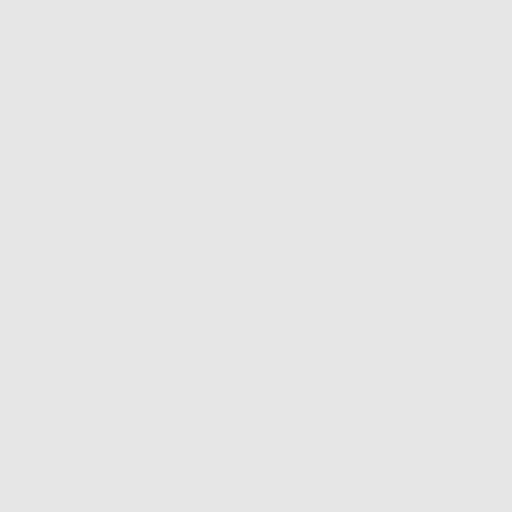
[frame 37/327  lung]
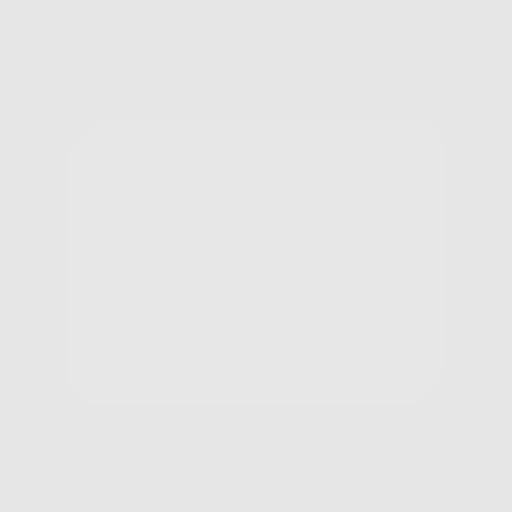
[frame 73/327  lung]
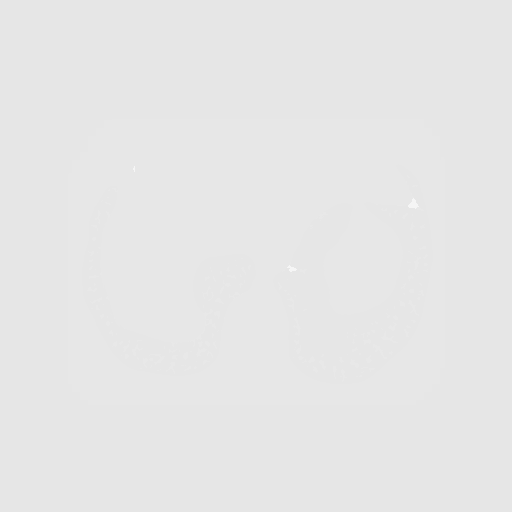
[frame 109/327  lung]
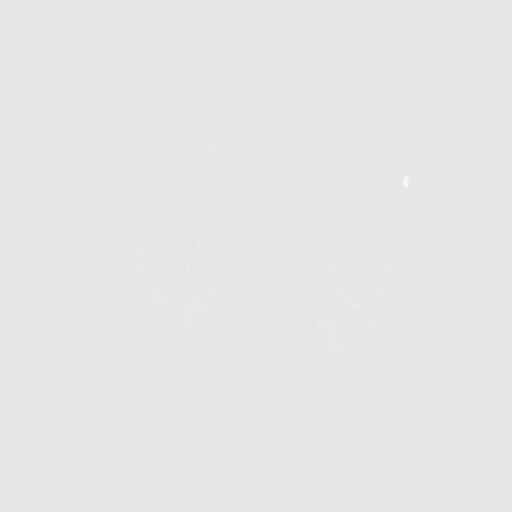
[frame 145/327  mediastinal]
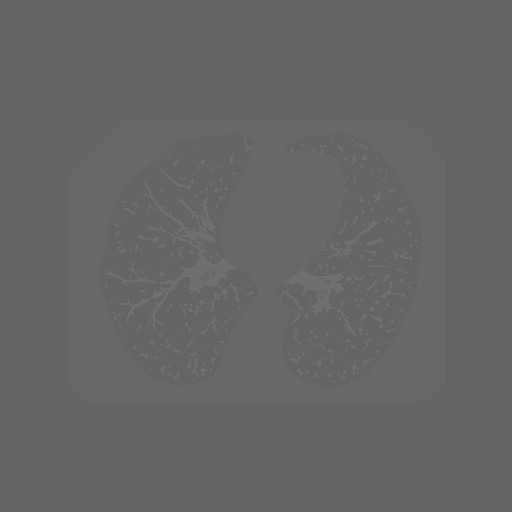
[frame 145/327  lung]
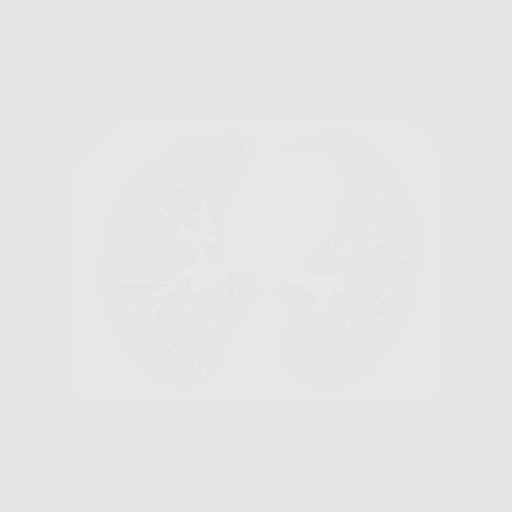
[frame 182/327  lung]
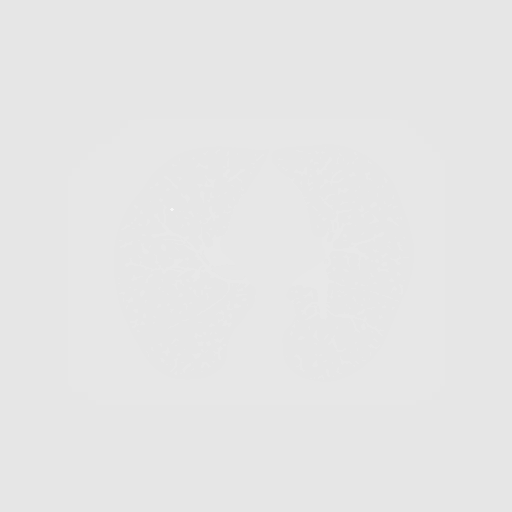
[frame 218/327  lung]
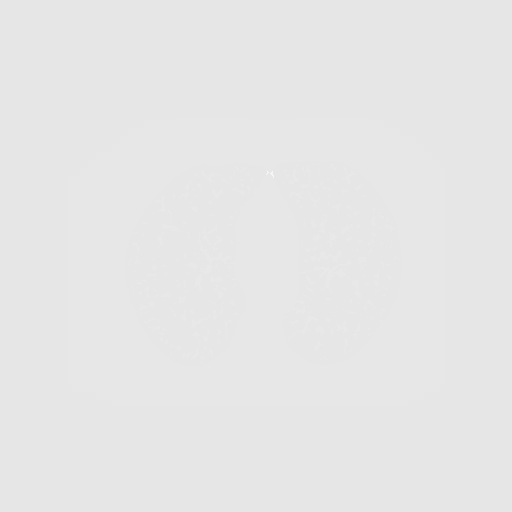
[frame 254/327  lung]
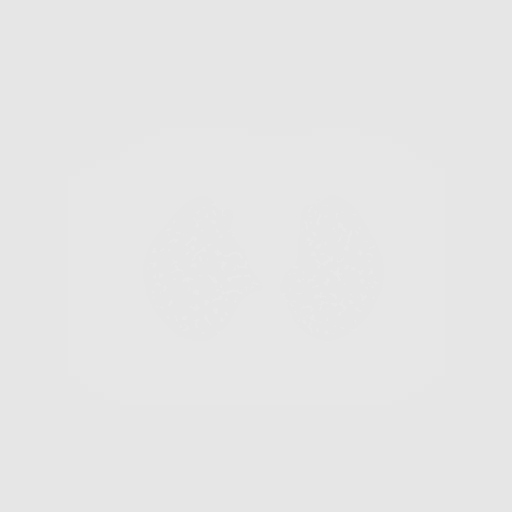
[frame 290/327  mediastinal]
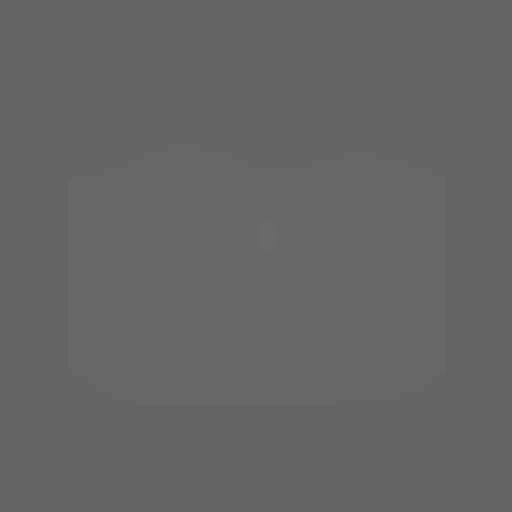
[frame 290/327  lung]
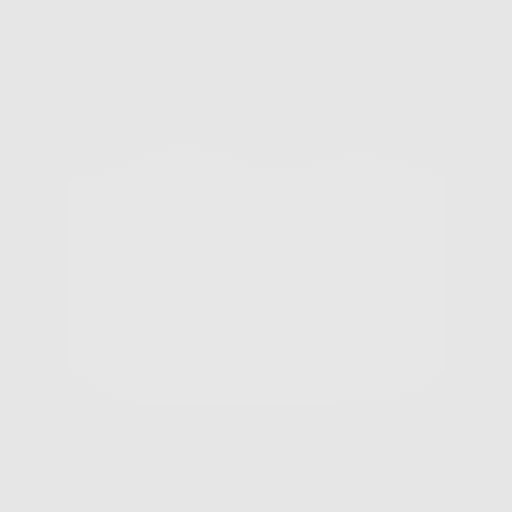
[frame 327/327  lung]
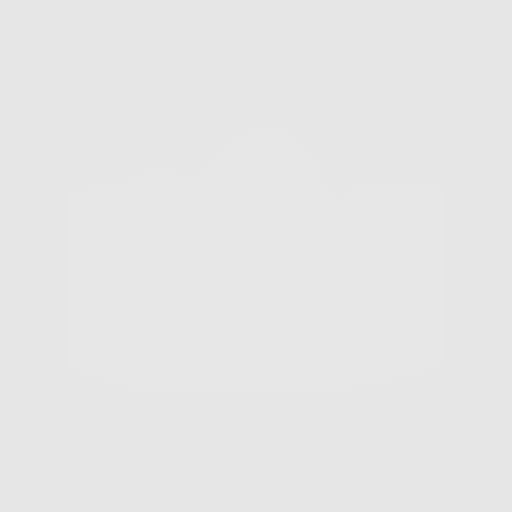

[10 of 40 positions shown; findings below may reference images not displayed]

FINDINGS: Cardiovascular: Atherosclerotic calcification of the aorta and
coronary arteries. Heart size normal. No pericardial effusion.

Mediastinum/Nodes: Mediastinal lymph nodes are not enlarged by CT
size criteria. Hilar regions are difficult to evaluate without IV
contrast. No axillary adenopathy. Esophagus is grossly unremarkable.

Lungs/Pleura: Centrilobular emphysema. Smoking related respiratory
bronchiolitis. Mild scarring in the right middle lobe and lingula.
Pulmonary nodules measure 4.8 mm or less in size and are subpleural
in location. No pleural fluid. Mild adherent debris in the trachea.

Upper Abdomen: Visualized portions of the liver, adrenal glands and
right kidney are unremarkable. 4.6 cm low-attenuation lesion in the
left kidney, incompletely visualized. Visualized portions of the
spleen, pancreas, stomach and bowel are grossly unremarkable. No
upper abdominal adenopathy.

Musculoskeletal: Degenerative changes in the spine. No worrisome
lytic or sclerotic lesions.
IMPRESSION: 1. Lung-RADS 2, benign appearance or behavior. Continue annual
screening with low-dose chest CT without contrast in 12 months.
2. Aortic atherosclerosis (MT9I9-170.0). Coronary artery
calcification.

## 2020-05-17 ENCOUNTER — Ambulatory Visit (HOSPITAL_BASED_OUTPATIENT_CLINIC_OR_DEPARTMENT_OTHER)
Admission: RE | Admit: 2020-05-17 | Discharge: 2020-05-17 | Disposition: A | Discharge status: Home / Self Care | Payer: BC Managed Care – PPO | Source: Ambulatory Visit | Attending: Acute Care | Admitting: Acute Care

## 2020-05-17 ENCOUNTER — Other Ambulatory Visit: Payer: Self-pay

## 2020-05-17 DIAGNOSIS — Z87891 Personal history of nicotine dependence: Secondary | ICD-10-CM | POA: Diagnosis not present

## 2020-05-17 NOTE — Progress Notes (Signed)
Please call patient and let them  know their  low dose Ct was read as a Lung RADS 2: nodules that are benign in appearance and behavior with a very low likelihood of becoming a clinically active cancer due to size or lack of growth. Recommendation per radiology is for a repeat LDCT in 12 months. .Please let them  know we will order and schedule their  annual screening scan for 05/2021 Please let them  know there was notation of CAD on their  scan.  Please remind the patient  that this is a non-gated exam therefore degree or severity of disease  cannot be determined. Please have them  follow up with their PCP regarding potential risk factor modification, dietary therapy or pharmacologic therapy if clinically indicated. Pt.  is  currently on statin therapy. Please place order for annual  screening scan for  05/2021 and fax results to PCP. Thanks so much. 

## 2020-05-21 DIAGNOSIS — J449 Chronic obstructive pulmonary disease, unspecified: Secondary | ICD-10-CM | POA: Diagnosis not present

## 2020-05-21 DIAGNOSIS — J432 Centrilobular emphysema: Secondary | ICD-10-CM | POA: Diagnosis not present

## 2020-05-24 ENCOUNTER — Other Ambulatory Visit: Payer: Self-pay | Admitting: *Deleted

## 2020-05-24 DIAGNOSIS — Z87891 Personal history of nicotine dependence: Secondary | ICD-10-CM

## 2020-06-04 ENCOUNTER — Other Ambulatory Visit: Payer: Self-pay | Admitting: Family Medicine

## 2020-06-04 DIAGNOSIS — I2510 Atherosclerotic heart disease of native coronary artery without angina pectoris: Secondary | ICD-10-CM

## 2020-06-04 DIAGNOSIS — I10 Essential (primary) hypertension: Secondary | ICD-10-CM

## 2020-06-06 DIAGNOSIS — Z860100 Personal history of colon polyps, unspecified: Secondary | ICD-10-CM

## 2020-06-06 HISTORY — DX: Personal history of colonic polyps: Z86.010

## 2020-06-06 HISTORY — DX: Personal history of colon polyps, unspecified: Z86.0100

## 2020-06-07 DIAGNOSIS — K2270 Barrett's esophagus without dysplasia: Secondary | ICD-10-CM | POA: Diagnosis not present

## 2020-06-07 DIAGNOSIS — Z86010 Personal history of colonic polyps: Secondary | ICD-10-CM | POA: Diagnosis not present

## 2020-06-07 DIAGNOSIS — K219 Gastro-esophageal reflux disease without esophagitis: Secondary | ICD-10-CM | POA: Diagnosis not present

## 2020-06-18 DIAGNOSIS — B019 Varicella without complication: Secondary | ICD-10-CM | POA: Insufficient documentation

## 2020-06-18 DIAGNOSIS — E785 Hyperlipidemia, unspecified: Secondary | ICD-10-CM | POA: Insufficient documentation

## 2020-06-18 DIAGNOSIS — K2270 Barrett's esophagus without dysplasia: Secondary | ICD-10-CM

## 2020-06-18 HISTORY — DX: Barrett's esophagus without dysplasia: K22.70

## 2020-06-19 ENCOUNTER — Other Ambulatory Visit: Payer: Self-pay

## 2020-06-19 ENCOUNTER — Encounter: Payer: Self-pay | Admitting: Cardiology

## 2020-06-19 ENCOUNTER — Ambulatory Visit: Payer: BC Managed Care – PPO | Admitting: Cardiology

## 2020-06-19 VITALS — BP 152/80 | HR 93 | Ht 64.0 in | Wt 225.0 lb

## 2020-06-19 DIAGNOSIS — E669 Obesity, unspecified: Secondary | ICD-10-CM

## 2020-06-19 DIAGNOSIS — E782 Mixed hyperlipidemia: Secondary | ICD-10-CM | POA: Diagnosis not present

## 2020-06-19 DIAGNOSIS — I2510 Atherosclerotic heart disease of native coronary artery without angina pectoris: Secondary | ICD-10-CM | POA: Diagnosis not present

## 2020-06-19 DIAGNOSIS — I10 Essential (primary) hypertension: Secondary | ICD-10-CM

## 2020-06-19 HISTORY — DX: Obesity, unspecified: E66.9

## 2020-06-19 NOTE — Patient Instructions (Signed)

## 2020-06-19 NOTE — Progress Notes (Signed)
Cardiology Office Note:    Date:  06/19/2020   ID:  Justin Boyd, DOB 29-Jun-1950, MRN [id number]  PCP:  [name], MD  Cardiologist:  Jenean Lindau, MD   Referring MD: [name],*    ASSESSMENT:    1. Atherosclerosis of native coronary artery of native heart without angina pectoris   2. Essential hypertension   3. Mixed hyperlipidemia   4. Obesity (BMI 35.0-39.9 without comorbidity)    PLAN:    In order of problems listed above:  1. Coronary atherosclerosis: Secondary prevention stressed with patient.  Importance of compliance with diet medication stressed any vocalized understanding.  I told him to walk to the best of his ability.  He has issues with COPD and that limits his walking.  I told him to be in touch with his pulmonologist and to use his supplemental oxygen when he exercises especially since he gets out of breath and his oxygen level drops.  He understands. 2. Essential hypertension: Blood pressure stable and diet was emphasized.  Lifestyle modification and salt intake issues were discussed and he promises to comply. 3. Mixed dyslipidemia: Lipids were reviewed.  Diet was emphasized.  He is going to get blood work done in the next few days with his primary care doctor and send me a copy. 4. Obesity: Weight reduction was stressed.  Risks of obesity explained and he promises to comply with diet and exercise. 5. Patient will be seen in follow-up appointment in 6 months or earlier if the patient has any concerns    Medication Adjustments/Labs and Tests Ordered: Current medicines are reviewed at length with the patient today.  Concerns regarding medicines are outlined above.  Orders Placed This Encounter  Procedures  . EKG 12-Lead   No orders of the defined types were placed in this encounter.    No chief complaint on file.    History of Present Illness:    Justin Boyd is a 70 y.o. male.  Patient has past medical history of  atherosclerotic vascular disease, essential hypertension, mixed dyslipidemia and obesity.  He leads a sedentary lifestyle and has gained weight.  He has COPD.  No chest pain orthopnea or PND.  At the time of my evaluation, the patient is alert awake oriented and in no distress.  Past Medical History:  Diagnosis Date  . Abnormal CXR 03/30/2018  . Anxiety disorder 04/12/2018  . Atherosclerosis of coronary artery 05/25/2018  . Atherosclerosis of native coronary artery of native heart without angina pectoris 12/30/2018  . Atypical squamoproliferative skin lesion 09/01/2019  . Barrett's esophagus without dysplasia 06/18/2020   Formatting of this note might be different from the original. last EGD 2 yr ago, per [facility]  . Centrilobular emphysema ([place]) 09/06/2018  . Chicken pox   . CKD (chronic kidney disease), stage III ([place]) 12/23/2018  . COPD with acute exacerbation ([place]) 02/14/2019   PFT's 06/2018 FVC (L) 2.65 3.64 72 2.84 78 +7 FEV1 (L) 1.35 2.71 49 1.62 60 +20 FEV1/FVC (%) 51 75 68 57 76 +11 FEV6 (L) 2.59 3.43 75 2.76 80 +6 FEV1/FEV6 (%) 52 78 66 59 75 +12 FEF 25-75% (L/sec) 0.59 2.19 26 0.85 39 +44 FEF Max (L/sec) 5.51 7.53 73 5.26 69 -4 FIVC (L) 2.36 2.72 +15 FIF Max (L/sec) 3.60 3.30 -8 ---- LUNG VOLUMES ---- SVC (L) 3.01 3.64 82 IC (L) 2.26 2.83 79 RV ([place]) (L) 2.37 2.0  . Diverticulosis 12/04/2011   Formatting of this note might be  different from the original. per colonoscopy report  . Elevated BP without diagnosis of hypertension 03/30/2018  . Elevated glucose 09/01/2019  . Elevated LDL cholesterol level 04/12/2018  . Essential hypertension 10/19/2018  . Exercise hypoxemia 02/14/2019  . Gastroesophageal reflux disease 10/19/2018  . Healthcare maintenance 09/01/2019  . History of colonic polyps 06/06/2020  . Hyperlipidemia   . Need for Tdap vaccination 03/30/2018  . Restless leg syndrome 07/30/2018  . Sleep walking 10/19/2018  . SOB (shortness of breath) on exertion 03/30/2018  . Sprain of  right wrist 12/30/2018  . Vitamin D deficiency 04/12/2018  . Wrist pain, acute, right 12/23/2018    Past Surgical History:  Procedure Laterality Date  . carpal tunnel both hands  2010  . SHOULDER SURGERY Right 2016    Current Medications: Current Meds  Medication Sig  . albuterol (VENTOLIN HFA) 108 (90 Base) MCG/ACT inhaler Inhale 2 puffs into the lungs every 4 (four) hours as needed for wheezing or shortness of breath.  Marland Kitchen amLODipine (NORVASC) 5 MG tablet TAKE 1 TABLET(5 MG) BY MOUTH DAILY  . Fluticasone-Umeclidin-Vilant (TRELEGY ELLIPTA) 100-62.5-25 MCG/INH AEPB Inhale 1 puff into the lungs daily.  Marland Kitchen gabapentin (NEURONTIN) 300 MG capsule TAKE 1 CAPSULE(300 MG) BY MOUTH THREE TIMES DAILY  . metoprolol succinate (TOPROL-XL) 50 MG 24 hr tablet TAKE 1 TABLET(50 MG) BY MOUTH DAILY WITH OR IMMEDIATELY FOLLOWING A MEAL  . pantoprazole (PROTONIX) 40 MG tablet Take 40 mg by mouth daily.  . rosuvastatin (CRESTOR) 20 MG tablet Take 1 tablet (20 mg total) by mouth daily.     Allergies:   Oxycodone-acetaminophen, Hydrocodone-acetaminophen, and Penicillins   Social History   Socioeconomic History  . Marital status: Married    Spouse name: Not on file  . Number of children: Not on file  . Years of education: Not on file  . Highest education level: Not on file  Occupational History  . Not on file  Tobacco Use  . Smoking status: Former Smoker    Packs/day: 1.00    Years: 45.00    Pack years: 45.00    Types: Cigarettes    Quit date: 05/05/2018    Years since quitting: 2.1  . Smokeless tobacco: Never Used  Vaping Use  . Vaping Use: Never used  Substance and Sexual Activity  . Alcohol use: Yes    Comment: 2 cans of beer a week  . Drug use: Never  . Sexual activity: Yes    Partners: Female  Other Topics Concern  . Not on file  Social History Narrative  . Not on file   Social Determinants of Health   Financial Resource Strain: Not on file  Food Insecurity: Not on file   Transportation Needs: Not on file  Physical Activity: Not on file  Stress: Not on file  Social Connections: Not on file     Family History: The patient's family history includes Hypertension in his mother; Stroke in his mother.  ROS:   Please see the history of present illness.    All other systems reviewed and are negative.  EKGs/Labs/Other Studies Reviewed:    The following studies were reviewed today: EKG reveals sinus rhythm with right bundle branch block and nonspecific ST-T changes   Recent Labs: 12/26/2019: ALT 36; BUN 16; Creatinine, Ser 1.08; Hemoglobin 14.7; Platelets 149.0; Potassium 4.2; Sodium 140  Recent Lipid Panel    Component Value Date/Time   CHOL 141 12/26/2019 0853   TRIG 262.0 (H) 12/26/2019 0853   HDL 32.70 (L)  12/26/2019 0853   CHOLHDL 4 12/26/2019 0853   VLDL 52.4 (H) 12/26/2019 0853   LDLDIRECT 83.0 12/26/2019 0853    Physical Exam:    VS:  BP (!) 152/80   Pulse 93   Ht 5\' 4"  (1.626 m)   Wt 225 lb 0.6 oz (102.1 kg)   SpO2 94%   BMI 38.63 kg/m     Wt Readings from Last 3 Encounters:  06/19/20 225 lb 0.6 oz (102.1 kg)  04/13/20 224 lb 6 oz (101.8 kg)  12/26/19 228 lb 9.6 oz (103.7 kg)     GEN: Patient is in no acute distress HEENT: Normal NECK: No JVD; No carotid bruits LYMPHATICS: No lymphadenopathy CARDIAC: Hear sounds regular, 2/6 systolic murmur at the apex. RESPIRATORY:  Clear to auscultation without rales, wheezing or rhonchi  ABDOMEN: Soft, non-tender, non-distended MUSCULOSKELETAL:  No edema; No deformity  SKIN: Warm and dry NEUROLOGIC:  Alert and oriented x 3 PSYCHIATRIC:  Normal affect   Signed, Jenean Lindau, MD  06/19/2020 9:36 AM    [facility]

## 2020-06-21 DIAGNOSIS — J432 Centrilobular emphysema: Secondary | ICD-10-CM | POA: Diagnosis not present

## 2020-06-21 DIAGNOSIS — J449 Chronic obstructive pulmonary disease, unspecified: Secondary | ICD-10-CM | POA: Diagnosis not present

## 2020-07-02 ENCOUNTER — Other Ambulatory Visit: Payer: Self-pay | Admitting: Family Medicine

## 2020-07-02 DIAGNOSIS — I10 Essential (primary) hypertension: Secondary | ICD-10-CM

## 2020-07-02 DIAGNOSIS — I2510 Atherosclerotic heart disease of native coronary artery without angina pectoris: Secondary | ICD-10-CM

## 2020-07-02 NOTE — Telephone Encounter (Signed)
Last fill 06/04/20  #30/0 Last OV 12/26/19 Pt need an office follow up visit before any other refills.

## 2020-07-05 NOTE — Telephone Encounter (Signed)
error 

## 2020-07-10 DIAGNOSIS — H353131 Nonexudative age-related macular degeneration, bilateral, early dry stage: Secondary | ICD-10-CM | POA: Diagnosis not present

## 2020-07-19 DIAGNOSIS — J449 Chronic obstructive pulmonary disease, unspecified: Secondary | ICD-10-CM | POA: Diagnosis not present

## 2020-07-19 DIAGNOSIS — J432 Centrilobular emphysema: Secondary | ICD-10-CM | POA: Diagnosis not present

## 2020-07-30 ENCOUNTER — Other Ambulatory Visit: Payer: Self-pay | Admitting: Family Medicine

## 2020-07-30 DIAGNOSIS — I2510 Atherosclerotic heart disease of native coronary artery without angina pectoris: Secondary | ICD-10-CM

## 2020-07-30 DIAGNOSIS — I10 Essential (primary) hypertension: Secondary | ICD-10-CM

## 2020-07-30 NOTE — Telephone Encounter (Signed)
Sent in today 

## 2020-08-06 ENCOUNTER — Other Ambulatory Visit: Payer: Self-pay | Admitting: Family Medicine

## 2020-08-06 DIAGNOSIS — I10 Essential (primary) hypertension: Secondary | ICD-10-CM

## 2020-08-06 DIAGNOSIS — I2510 Atherosclerotic heart disease of native coronary artery without angina pectoris: Secondary | ICD-10-CM

## 2020-08-10 ENCOUNTER — Other Ambulatory Visit: Payer: Self-pay

## 2020-08-11 ENCOUNTER — Other Ambulatory Visit: Payer: Self-pay | Admitting: Family Medicine

## 2020-08-11 DIAGNOSIS — G2581 Restless legs syndrome: Secondary | ICD-10-CM

## 2020-08-13 ENCOUNTER — Encounter: Payer: Self-pay | Admitting: Family Medicine

## 2020-08-13 ENCOUNTER — Ambulatory Visit: Payer: BC Managed Care – PPO | Admitting: Family Medicine

## 2020-08-13 ENCOUNTER — Other Ambulatory Visit: Payer: Self-pay

## 2020-08-13 ENCOUNTER — Ambulatory Visit (INDEPENDENT_AMBULATORY_CARE_PROVIDER_SITE_OTHER): Payer: BC Managed Care – PPO

## 2020-08-13 VITALS — BP 124/72 | HR 68 | Temp 96.3°F | Ht 64.0 in | Wt 224.2 lb

## 2020-08-13 DIAGNOSIS — I2510 Atherosclerotic heart disease of native coronary artery without angina pectoris: Secondary | ICD-10-CM

## 2020-08-13 DIAGNOSIS — R7309 Other abnormal glucose: Secondary | ICD-10-CM | POA: Diagnosis not present

## 2020-08-13 DIAGNOSIS — I10 Essential (primary) hypertension: Secondary | ICD-10-CM

## 2020-08-13 DIAGNOSIS — M79604 Pain in right leg: Secondary | ICD-10-CM

## 2020-08-13 DIAGNOSIS — M79605 Pain in left leg: Secondary | ICD-10-CM

## 2020-08-13 DIAGNOSIS — M25552 Pain in left hip: Secondary | ICD-10-CM | POA: Diagnosis not present

## 2020-08-13 DIAGNOSIS — M25551 Pain in right hip: Secondary | ICD-10-CM

## 2020-08-13 DIAGNOSIS — Z0000 Encounter for general adult medical examination without abnormal findings: Secondary | ICD-10-CM

## 2020-08-13 DIAGNOSIS — E7800 Pure hypercholesterolemia, unspecified: Secondary | ICD-10-CM | POA: Diagnosis not present

## 2020-08-13 DIAGNOSIS — R0982 Postnasal drip: Secondary | ICD-10-CM

## 2020-08-13 DIAGNOSIS — K219 Gastro-esophageal reflux disease without esophagitis: Secondary | ICD-10-CM

## 2020-08-13 DIAGNOSIS — G2581 Restless legs syndrome: Secondary | ICD-10-CM

## 2020-08-13 DIAGNOSIS — M160 Bilateral primary osteoarthritis of hip: Secondary | ICD-10-CM | POA: Diagnosis not present

## 2020-08-13 HISTORY — DX: Pain in left hip: M25.551

## 2020-08-13 HISTORY — DX: Pain in left leg: M79.605

## 2020-08-13 HISTORY — DX: Pain in right leg: M79.604

## 2020-08-13 MED ORDER — AMLODIPINE BESYLATE 5 MG PO TABS
ORAL_TABLET | ORAL | 4 refills | Status: DC
Start: 2020-08-13 — End: 2021-08-14

## 2020-08-13 MED ORDER — GABAPENTIN 400 MG PO CAPS: 400.0000 mg | ORAL_CAPSULE | Freq: Three times a day (TID) | ORAL | 3 refills | Status: DC

## 2020-08-13 MED ORDER — METOPROLOL SUCCINATE ER 50 MG PO TB24: ORAL_TABLET | ORAL | 4 refills | Status: DC

## 2020-08-13 MED ORDER — PANTOPRAZOLE SODIUM 40 MG PO TBEC: 40.0000 mg | DELAYED_RELEASE_TABLET | Freq: Every day | ORAL | 3 refills | Status: DC

## 2020-08-13 MED ORDER — TRELEGY ELLIPTA 100-62.5-25 MCG/INH IN AEPB: 1.0000 | INHALATION_SPRAY | Freq: Every day | RESPIRATORY_TRACT | 3 refills | Status: DC

## 2020-08-13 MED ORDER — ROSUVASTATIN CALCIUM 20 MG PO TABS: 20.0000 mg | ORAL_TABLET | Freq: Every day | ORAL | 3 refills | Status: DC

## 2020-08-13 NOTE — Addendum Note (Signed)
Addended by: Beryle Lathe S on: 08/13/2020 02:28 PM   Modules accepted: Orders

## 2020-08-13 NOTE — Progress Notes (Signed)
Established Patient Office Visit  Subjective:  Patient ID: Justin Boyd, male    DOB: 11-30-1950  Age: 70 y.o. MRN: [id number]  CC:  Chief Complaint  Patient presents with  . Follow-up    6 month follow up on BP and labs. Not sure if he should be on both BP medications.     HPI Justin Boyd presents for follow-up of hypertension, elevated cholesterol and has been experiencing bilateral hip and leg pain when he exercises.  Denies back pain.  Seems to be losing some muscle strength.  Denies numbness or tingling changes in bowel habits.  Does have to stop exercising but it takes a while for the pain to resolve.  Denies chest pain or shortness of breath.  Continues to work out with a Clinical research associate.  Past Medical History:  Diagnosis Date  . Abnormal CXR 03/30/2018  . Anxiety disorder 04/12/2018  . Atherosclerosis of coronary artery 05/25/2018  . Atherosclerosis of native coronary artery of native heart without angina pectoris 12/30/2018  . Atypical squamoproliferative skin lesion 09/01/2019  . Barrett's esophagus without dysplasia 06/18/2020   Formatting of this note might be different from the original. last EGD 2 yr ago, per [facility]  . Centrilobular emphysema ([place]) 09/06/2018  . Chicken pox   . CKD (chronic kidney disease), stage III ([place]) 12/23/2018  . COPD with acute exacerbation ([place]) 02/14/2019   PFT's 06/2018 FVC (L) 2.65 3.64 72 2.84 78 +7 FEV1 (L) 1.35 2.71 49 1.62 60 +20 FEV1/FVC (%) 51 75 68 57 76 +11 FEV6 (L) 2.59 3.43 75 2.76 80 +6 FEV1/FEV6 (%) 52 78 66 59 75 +12 FEF 25-75% (L/sec) 0.59 2.19 26 0.85 39 +44 FEF Max (L/sec) 5.51 7.53 73 5.26 69 -4 FIVC (L) 2.36 2.72 +15 FIF Max (L/sec) 3.60 3.30 -8 ---- LUNG VOLUMES ---- SVC (L) 3.01 3.64 82 IC (L) 2.26 2.83 79 RV ([place]) (L) 2.37 2.0  . Diverticulosis 12/04/2011   Formatting of this note might be different from the original. per colonoscopy report  . Elevated BP without diagnosis of hypertension 03/30/2018  . Elevated glucose  09/01/2019  . Elevated LDL cholesterol level 04/12/2018  . Essential hypertension 10/19/2018  . Exercise hypoxemia 02/14/2019  . Gastroesophageal reflux disease 10/19/2018  . Healthcare maintenance 09/01/2019  . History of colonic polyps 06/06/2020  . Hyperlipidemia   . Need for Tdap vaccination 03/30/2018  . Restless leg syndrome 07/30/2018  . Sleep walking 10/19/2018  . SOB (shortness of breath) on exertion 03/30/2018  . Sprain of right wrist 12/30/2018  . Vitamin D deficiency 04/12/2018  . Wrist pain, acute, right 12/23/2018    Past Surgical History:  Procedure Laterality Date  . carpal tunnel both hands  2010  . SHOULDER SURGERY Right 2016    Family History  Problem Relation Age of Onset  . Hypertension Mother   . Stroke Mother     Social History   Socioeconomic History  . Marital status: Married    Spouse name: Not on file  . Number of children: Not on file  . Years of education: Not on file  . Highest education level: Not on file  Occupational History  . Not on file  Tobacco Use  . Smoking status: Former Smoker    Packs/day: 1.00    Years: 45.00    Pack years: 45.00    Types: Cigarettes    Quit date: 05/05/2018    Years since quitting: 2.2  . Smokeless tobacco: Never Used  Vaping  Use  . Vaping Use: Never used  Substance and Sexual Activity  . Alcohol use: Yes    Comment: 2 cans of beer a week  . Drug use: Never  . Sexual activity: Yes    Partners: Female  Other Topics Concern  . Not on file  Social History Narrative  . Not on file   Social Determinants of Health   Financial Resource Strain: Not on file  Food Insecurity: Not on file  Transportation Needs: Not on file  Physical Activity: Not on file  Stress: Not on file  Social Connections: Not on file  Intimate Partner Violence: Not on file    Outpatient Medications Prior to Visit  Medication Sig Dispense Refill  . albuterol (VENTOLIN HFA) 108 (90 Base) MCG/ACT inhaler Inhale 2 puffs into the lungs  every 4 (four) hours as needed for wheezing or shortness of breath. 1 each 5  . amLODipine (NORVASC) 5 MG tablet TAKE 1 TABLET(5 MG) BY MOUTH DAILY 90 tablet 0  . Fluticasone-Umeclidin-Vilant (TRELEGY ELLIPTA) 100-62.5-25 MCG/INH AEPB Inhale 1 puff into the lungs daily. 180 each 3  . metoprolol succinate (TOPROL-XL) 50 MG 24 hr tablet TAKE 1 TABLET(50 MG) BY MOUTH DAILY WITH OR IMMEDIATELY FOLLOWING A MEAL 90 tablet 0  . nitroGLYCERIN (NITROSTAT) 0.4 MG SL tablet Place 1 tablet (0.4 mg total) under the tongue every 5 (five) minutes as needed for chest pain. 10 tablet 6  . pantoprazole (PROTONIX) 40 MG tablet Take 40 mg by mouth daily.    . rosuvastatin (CRESTOR) 20 MG tablet Take 1 tablet (20 mg total) by mouth daily. 90 tablet 3  . gabapentin (NEURONTIN) 300 MG capsule TAKE 1 CAPSULE(300 MG) BY MOUTH THREE TIMES DAILY 90 capsule 3   No facility-administered medications prior to visit.    Allergies  Allergen Reactions  . Oxycodone-Acetaminophen Other (See Comments)    Insomnia, so is able to take it Insomnia, so is able to take it   . Hydrocodone-Acetaminophen Itching    insomnia insomnia   . Penicillins Swelling    Extreme soreness Extreme soreness     ROS Review of Systems  Constitutional: Negative.   HENT: Negative.   Eyes: Negative for photophobia and visual disturbance.  Respiratory: Negative.   Cardiovascular: Negative.   Gastrointestinal: Negative.   Endocrine: Negative for polyphagia and polyuria.  Genitourinary: Negative.   Musculoskeletal: Positive for myalgias. Negative for back pain.  Neurological: Positive for weakness. Negative for numbness.  Psychiatric/Behavioral: Negative.       Objective:    Physical Exam Vitals and nursing note reviewed.  Constitutional:      General: He is not in acute distress.    Appearance: Normal appearance. He is obese. He is not ill-appearing, toxic-appearing or diaphoretic.  HENT:     Head: Normocephalic and atraumatic.      Right Ear: Tympanic membrane, ear canal and external ear normal.     Left Ear: Tympanic membrane, ear canal and external ear normal.     Mouth/Throat:     Mouth: Mucous membranes are moist.     Pharynx: Oropharynx is clear. No oropharyngeal exudate or posterior oropharyngeal erythema.  Eyes:     General: No scleral icterus.       Right eye: No discharge.        Left eye: No discharge.     Extraocular Movements: Extraocular movements intact.     Conjunctiva/sclera: Conjunctivae normal.     Pupils: Pupils are equal, round, and reactive to light.  Cardiovascular:     Rate and Rhythm: Normal rate and regular rhythm.     Pulses:          Dorsalis pedis pulses are 2+ on the right side and 2+ on the left side.       Posterior tibial pulses are 0 on the right side and 0 on the left side.     Comments: Cap refill great toes brisk.  Pulmonary:     Effort: Pulmonary effort is normal.     Breath sounds: Normal breath sounds.  Abdominal:     General: Bowel sounds are normal.  Musculoskeletal:     Cervical back: No rigidity or tenderness.     Lumbar back: No tenderness or bony tenderness. Decreased range of motion. Negative right straight leg raise test and negative left straight leg raise test.     Right hip: No tenderness. Decreased range of motion. Normal strength.     Left hip: No tenderness. Decreased range of motion. Normal strength.     Right lower leg: No edema.     Left lower leg: No edema.  Lymphadenopathy:     Cervical: No cervical adenopathy.  Skin:    General: Skin is warm and dry.  Neurological:     Mental Status: He is alert and oriented to person, place, and time.  Psychiatric:        Mood and Affect: Mood normal.        Behavior: Behavior normal.     BP 124/72   Pulse 68   Temp (!) 96.3 F (35.7 C) (Temporal)   Ht 5\' 4"  (1.626 m)   Wt 224 lb 3.2 oz (101.7 kg)   SpO2 92%   BMI 38.48 kg/m  Wt Readings from Last 3 Encounters:  08/13/20 224 lb 3.2 oz (101.7 kg)   06/19/20 225 lb 0.6 oz (102.1 kg)  04/13/20 224 lb 6 oz (101.8 kg)     Health Maintenance Due  Topic Date Due  . Hepatitis C Screening  Never done  . COVID-19 Vaccine (4 - Booster for Pfizer series) 08/01/2020    There are no preventive care reminders to display for this patient.  Lab Results  Component Value Date   TSH 1.16 03/29/2018   Lab Results  Component Value Date   WBC 6.6 12/26/2019   HGB 14.7 12/26/2019   HCT 43.7 12/26/2019   MCV 89.8 12/26/2019   PLT 149.0 (L) 12/26/2019   Lab Results  Component Value Date   NA 140 12/26/2019   K 4.2 12/26/2019   CO2 25 12/26/2019   GLUCOSE 103 (H) 12/26/2019   BUN 16 12/26/2019   CREATININE 1.08 12/26/2019   BILITOT 0.7 12/26/2019   ALKPHOS 77 12/26/2019   AST 20 12/26/2019   ALT 36 12/26/2019   PROT 6.5 12/26/2019   ALBUMIN 4.2 12/26/2019   CALCIUM 9.2 12/26/2019   GFR 68.16 12/26/2019   Lab Results  Component Value Date   CHOL 141 12/26/2019   Lab Results  Component Value Date   HDL 32.70 (L) 12/26/2019   No results found for: [facility] Lab Results  Component Value Date   TRIG 262.0 (H) 12/26/2019   Lab Results  Component Value Date   CHOLHDL 4 12/26/2019   Lab Results  Component Value Date   HGBA1C 5.8 09/01/2019      Assessment & Plan:   Problem List Items Addressed This Visit      Cardiovascular and Mediastinum   Essential hypertension - Primary  Relevant Orders   CBC   Comprehensive metabolic panel   Urinalysis, Routine w reflex microscopic   Atherosclerosis of native coronary artery of native heart without angina pectoris   Relevant Orders   LDL cholesterol, direct   Lipid panel     Other   Elevated LDL cholesterol level   Relevant Orders   Comprehensive metabolic panel   LDL cholesterol, direct   Lipid panel   Restless leg syndrome   Relevant Medications   gabapentin (NEURONTIN) 400 MG capsule   Elevated glucose   Relevant Orders   Comprehensive metabolic panel    Hemoglobin A1c   Healthcare maintenance   Relevant Orders   PSA   Hip pain, bilateral   Relevant Orders   DG Hip Unilat W OR W/O Pelvis 2-3 Views Left   DG Hip Unilat W OR W/O Pelvis 2-3 Views Right    Other Visit Diagnoses    Pain in both lower extremities       Relevant Orders   VAS [place] ABI WITH/WO TBI      Meds ordered this encounter  Medications  . gabapentin (NEURONTIN) 400 MG capsule    Sig: Take 1 capsule (400 mg total) by mouth 3 (three) times daily.    Dispense:  90 capsule    Refill:  3    Follow-up: No follow-ups on file.  Again recommended weight loss and working with his trainer on flexibility.  Have increased the dose of the gabapentin to help with restless leg and hopefully help with his chronic leg pain.  He is not quite 6 hours fasting and will return in a few hours for his fasting labs for today.  [name], MD

## 2020-08-13 NOTE — Addendum Note (Signed)
Addended by: Lynnea Ferrier on: 08/13/2020 03:36 PM   Modules accepted: Orders

## 2020-08-13 NOTE — Addendum Note (Signed)
Addended by: Lynnea Ferrier on: 08/13/2020 03:21 PM   Modules accepted: Orders

## 2020-08-14 LAB — HEMOGLOBIN A1C: Hgb A1c MFr Bld: 6.2 % (ref 4.6–6.5)

## 2020-08-14 LAB — URINALYSIS, ROUTINE W REFLEX MICROSCOPIC
Bilirubin Urine: NEGATIVE
Hgb urine dipstick: NEGATIVE
Ketones, ur: NEGATIVE
Leukocytes,Ua: NEGATIVE
Nitrite: NEGATIVE
RBC / HPF: NONE SEEN (ref 0–?)
Specific Gravity, Urine: 1.025 (ref 1.000–1.030)
Total Protein, Urine: NEGATIVE
Urine Glucose: NEGATIVE
Urobilinogen, UA: 0.2 (ref 0.0–1.0)
pH: 5.5 (ref 5.0–8.0)

## 2020-08-14 LAB — LDL CHOLESTEROL, DIRECT: Direct LDL: 93 mg/dL

## 2020-08-14 LAB — CBC
HCT: 47.9 % (ref 39.0–52.0)
Hemoglobin: 16.1 g/dL (ref 13.0–17.0)
MCHC: 33.6 g/dL (ref 30.0–36.0)
MCV: 86.9 fl (ref 78.0–100.0)
Platelets: 176 10*3/uL (ref 150.0–400.0)
RBC: 5.51 Mil/uL (ref 4.22–5.81)
RDW: 15 % (ref 11.5–15.5)
WBC: 7.6 10*3/uL (ref 4.0–10.5)

## 2020-08-14 LAB — COMPREHENSIVE METABOLIC PANEL
ALT: 46 U/L (ref 0–53)
AST: 23 U/L (ref 0–37)
Albumin: 4.2 g/dL (ref 3.5–5.2)
Alkaline Phosphatase: 85 U/L (ref 39–117)
BUN: 16 mg/dL (ref 6–23)
CO2: 25 mEq/L (ref 19–32)
Calcium: 9.4 mg/dL (ref 8.4–10.5)
Chloride: 104 mEq/L (ref 96–112)
Creatinine, Ser: 1.06 mg/dL (ref 0.40–1.50)
GFR: 72.47 mL/min (ref >60.00)
Glucose, Bld: 86 mg/dL (ref 70–99)
Potassium: 4.2 mEq/L (ref 3.5–5.1)
Sodium: 140 mEq/L (ref 135–145)
Total Bilirubin: 0.6 mg/dL (ref 0.2–1.2)
Total Protein: 6.9 g/dL (ref 6.0–8.3)

## 2020-08-14 LAB — LIPID PANEL
Cholesterol: 137 mg/dL (ref 0–200)
HDL: 30.7 mg/dL — ABNORMAL LOW (ref >39.00)
NonHDL: 105.8
Total CHOL/HDL Ratio: 4
Triglycerides: 232 mg/dL — ABNORMAL HIGH (ref 0.0–149.0)
VLDL: 46.4 mg/dL — ABNORMAL HIGH (ref 0.0–40.0)

## 2020-08-14 LAB — PSA: PSA: 0.33 ng/mL (ref 0.10–4.00)

## 2020-08-19 DIAGNOSIS — J432 Centrilobular emphysema: Secondary | ICD-10-CM | POA: Diagnosis not present

## 2020-08-19 DIAGNOSIS — J449 Chronic obstructive pulmonary disease, unspecified: Secondary | ICD-10-CM | POA: Diagnosis not present

## 2020-09-13 ENCOUNTER — Telehealth: Payer: Self-pay | Admitting: Family Medicine

## 2020-09-13 NOTE — Telephone Encounter (Signed)
There is an order still in for vascular US in and I called pt to see if he would still like scheduled and he said he already got results back from xray and didn't think he needed it but for me to clarify with Dr Ethelene Hal to be sure. Please advise

## 2020-09-13 NOTE — Telephone Encounter (Signed)
Scheduled tomorrow at [facility] [place]

## 2020-09-14 ENCOUNTER — Other Ambulatory Visit: Payer: Self-pay

## 2020-09-14 ENCOUNTER — Ambulatory Visit (HOSPITAL_COMMUNITY)
Admission: RE | Admit: 2020-09-14 | Discharge: 2020-09-14 | Disposition: A | Discharge status: Home / Self Care | Payer: BC Managed Care – PPO | Source: Ambulatory Visit | Attending: Family Medicine | Admitting: Family Medicine

## 2020-09-14 DIAGNOSIS — M79605 Pain in left leg: Secondary | ICD-10-CM | POA: Diagnosis not present

## 2020-09-14 DIAGNOSIS — M79604 Pain in right leg: Secondary | ICD-10-CM

## 2020-09-14 NOTE — Progress Notes (Signed)
ABI has been completed.  Attempted to call with preliminary results but could not hold on the phone any longer - next outpatient arrived.  Results can be found under chart review under CV PROC. 09/14/2020 11:30 AM Eon Zunker RVT, RDMS

## 2020-09-18 DIAGNOSIS — J432 Centrilobular emphysema: Secondary | ICD-10-CM | POA: Diagnosis not present

## 2020-09-18 DIAGNOSIS — J449 Chronic obstructive pulmonary disease, unspecified: Secondary | ICD-10-CM | POA: Diagnosis not present

## 2020-10-11 ENCOUNTER — Telehealth: Payer: Self-pay

## 2020-10-11 DIAGNOSIS — G2581 Restless legs syndrome: Secondary | ICD-10-CM

## 2020-10-11 NOTE — Telephone Encounter (Signed)
Pt called and states that his insurance now requires him to have a 90 day supply of gabapentin (NEURONTIN) 400 MG capsule. Please send to current pharmacy for refill 270 pills

## 2020-10-11 NOTE — Telephone Encounter (Signed)
Spoke with patient who sates that he needs old prescription discontinued and new Rx of pending Rx for a 90 day supply for his insurance to cover. Please advise.

## 2020-10-14 MED ORDER — GABAPENTIN 400 MG PO CAPS: 400.0000 mg | ORAL_CAPSULE | Freq: Three times a day (TID) | ORAL | 3 refills | Status: DC

## 2020-10-15 NOTE — Telephone Encounter (Signed)
Patient aware.

## 2020-10-19 DIAGNOSIS — J432 Centrilobular emphysema: Secondary | ICD-10-CM | POA: Diagnosis not present

## 2020-10-19 DIAGNOSIS — J449 Chronic obstructive pulmonary disease, unspecified: Secondary | ICD-10-CM | POA: Diagnosis not present

## 2020-11-18 DIAGNOSIS — J432 Centrilobular emphysema: Secondary | ICD-10-CM | POA: Diagnosis not present

## 2020-11-21 ENCOUNTER — Ambulatory Visit: Payer: BC Managed Care – PPO | Admitting: Dermatology

## 2020-11-21 ENCOUNTER — Encounter: Payer: Self-pay | Admitting: Dermatology

## 2020-11-21 ENCOUNTER — Other Ambulatory Visit: Payer: Self-pay

## 2020-11-21 DIAGNOSIS — L821 Other seborrheic keratosis: Secondary | ICD-10-CM

## 2020-11-21 DIAGNOSIS — D1800 Hemangioma unspecified site: Secondary | ICD-10-CM

## 2020-11-21 DIAGNOSIS — Z1283 Encounter for screening for malignant neoplasm of skin: Secondary | ICD-10-CM | POA: Diagnosis not present

## 2020-11-30 ENCOUNTER — Encounter: Payer: Self-pay | Admitting: Dermatology

## 2020-11-30 NOTE — Progress Notes (Signed)
   Follow-Up Visit   Subjective  Justin Boyd is a 70 y.o. male who presents for the following: Annual Exam (No new concerns).  General skin examination Location:  Duration:  Quality:  Associated Signs/Symptoms: Modifying Factors:  Severity:  Timing: Context:   Objective  Well appearing patient in no apparent distress; mood and affect are within normal limits. General examination showed no atypical pigmented lesions or nonmelanoma skin cancer.  Right Hip 5 mm brown textured flattopped papule  Left Breast 3 mm smooth red dermal papule    A full examination was performed including scalp, head, eyes, ears, nose, lips, neck, chest, axillae, abdomen, back, buttocks, bilateral upper extremities, bilateral lower extremities, hands, feet, fingers, toes, fingernails, and toenails. All findings within normal limits unless otherwise noted below.   Assessment & Plan    Seborrheic keratosis Right Hip  May leave if stable  Encounter for screening for malignant neoplasm of skin  Annual skin examination.  Encouraged to self examine twice annually.  Continue ultraviolet protection.  Hemangioma, unspecified site Left Breast  No intervention indicated      I, Lavonna Monarch, MD, have reviewed all documentation for this visit.  The documentation on 11/30/20 for the exam, diagnosis, procedures, and orders are all accurate and complete.

## 2020-12-03 DIAGNOSIS — Z20822 Contact with and (suspected) exposure to covid-19: Secondary | ICD-10-CM | POA: Diagnosis not present

## 2020-12-19 ENCOUNTER — Encounter: Payer: Self-pay | Admitting: Cardiology

## 2020-12-19 ENCOUNTER — Other Ambulatory Visit: Payer: Self-pay

## 2020-12-19 ENCOUNTER — Ambulatory Visit: Payer: BC Managed Care – PPO | Admitting: Cardiology

## 2020-12-19 VITALS — BP 110/64 | HR 68 | Ht 64.0 in | Wt 222.0 lb

## 2020-12-19 DIAGNOSIS — J441 Chronic obstructive pulmonary disease with (acute) exacerbation: Secondary | ICD-10-CM | POA: Diagnosis not present

## 2020-12-19 DIAGNOSIS — E782 Mixed hyperlipidemia: Secondary | ICD-10-CM

## 2020-12-19 DIAGNOSIS — I2510 Atherosclerotic heart disease of native coronary artery without angina pectoris: Secondary | ICD-10-CM

## 2020-12-19 DIAGNOSIS — E669 Obesity, unspecified: Secondary | ICD-10-CM

## 2020-12-19 DIAGNOSIS — I10 Essential (primary) hypertension: Secondary | ICD-10-CM

## 2020-12-19 DIAGNOSIS — J432 Centrilobular emphysema: Secondary | ICD-10-CM | POA: Diagnosis not present

## 2020-12-19 MED ORDER — NITROGLYCERIN 0.4 MG SL SUBL: 0.4000 mg | SUBLINGUAL_TABLET | SUBLINGUAL | 6 refills | Status: DC | PRN

## 2020-12-19 NOTE — Progress Notes (Signed)
Cardiology Office Note:    Date:  12/19/2020   ID:  Justin Boyd, DOB 01-16-1951, MRN [id number]  PCP:  [name], MD  Cardiologist:  Jenean Lindau, MD   Referring MD: [name],*    ASSESSMENT:    1. Atherosclerosis of native coronary artery of native heart without angina pectoris   2. Essential hypertension   3. Mixed hyperlipidemia   4. COPD with acute exacerbation (HCC)   5. Obesity (BMI 35.0-39.9 without comorbidity)    PLAN:    In order of problems listed above:  Coronary artery disease: Secondary prevention stressed with the patient.  Importance of compliance with diet medication stressed any vocalized understanding. Essential hypertension: Blood pressure stable and diet was emphasized.  Lifestyle modification urged Mixed dyslipidemia: Lipids reviewed and diet emphasized.  I answered questions about lipids. COPD: Stable at this time but causes problems with ambulation.  He is followed by pulmonologist for this. Abdominal obesity: Diet explained lifestyle modification urged and he promises to comply and do better. Patient will be seen in follow-up appointment in 6 months or earlier if the patient has any concerns    Medication Adjustments/Labs and Tests Ordered: Current medicines are reviewed at length with the patient today.  Concerns regarding medicines are outlined above.  No orders of the defined types were placed in this encounter.  No orders of the defined types were placed in this encounter.    No chief complaint on file.    History of Present Illness:    Justin Boyd is a 70 y.o. male.  Patient has past medical history of coronary artery disease, essential hypertension, mixed dyslipidemia and COPD.  He is overweight.  He mentions to me that he does not ambulate much because of COPD and arthritic issues affecting the hip.  No chest pain orthopnea or PND.  He has significant abdominal obesity.  At the time of my evaluation, the  patient is alert awake oriented and in no distress.  Past Medical History:  Diagnosis Date   Abnormal CXR 03/30/2018   Anxiety disorder 04/12/2018   Atherosclerosis of coronary artery 05/25/2018   Atherosclerosis of native coronary artery of native heart without angina pectoris 12/30/2018   Atypical squamoproliferative skin lesion 09/01/2019   Barrett's esophagus without dysplasia 06/18/2020   Formatting of this note might be different from the original. last EGD 2 yr ago, per [facility]   Centrilobular emphysema ([place]) 09/06/2018   Chicken pox    CKD (chronic kidney disease), stage III ([place]) 12/23/2018   COPD with acute exacerbation ([place]) 02/14/2019   PFT's 06/2018 FVC (L) 2.65 3.64 72 2.84 78 +7 FEV1 (L) 1.35 2.71 49 1.62 60 +20 FEV1/FVC (%) 51 75 68 57 76 +11 FEV6 (L) 2.59 3.43 75 2.76 80 +6 FEV1/FEV6 (%) 52 78 66 59 75 +12 FEF 25-75% (L/sec) 0.59 2.19 26 0.85 39 +44 FEF Max (L/sec) 5.51 7.53 73 5.26 69 -4 FIVC (L) 2.36 2.72 +15 FIF Max (L/sec) 3.60 3.30 -8 ---- LUNG VOLUMES ---- SVC (L) 3.01 3.64 82 IC (L) 2.26 2.83 79 RV ([place]) (L) 2.37 2.0   Diverticulosis 12/04/2011   Formatting of this note might be different from the original. per colonoscopy report   Elevated BP without diagnosis of hypertension 03/30/2018   Elevated glucose 09/01/2019   Elevated LDL cholesterol level 04/12/2018   Essential hypertension 10/19/2018   Exercise hypoxemia 02/14/2019   Gastroesophageal reflux disease 10/19/2018   Healthcare maintenance 09/01/2019   History of  colonic polyps 06/06/2020   Hyperlipidemia    Need for Tdap vaccination 03/30/2018   Restless leg syndrome 07/30/2018   Sleep walking 10/19/2018   SOB (shortness of breath) on exertion 03/30/2018   Sprain of right wrist 12/30/2018   Vitamin D deficiency 04/12/2018   Wrist pain, acute, right 12/23/2018    Past Surgical History:  Procedure Laterality Date   carpal tunnel both hands  2010   SHOULDER SURGERY Right 2016    Current Medications: Current  Meds  Medication Sig   albuterol (VENTOLIN HFA) 108 (90 Base) MCG/ACT inhaler Inhale 2 puffs into the lungs every 4 (four) hours as needed for wheezing or shortness of breath.   amLODipine (NORVASC) 5 MG tablet TAKE 1 TABLET(5 MG) BY MOUTH DAILY   Fluticasone-Umeclidin-Vilant (TRELEGY ELLIPTA) 100-62.5-25 MCG/INH AEPB Inhale 1 puff into the lungs daily.   gabapentin (NEURONTIN) 400 MG capsule Take 1 capsule (400 mg total) by mouth 3 (three) times daily.   metoprolol succinate (TOPROL-XL) 50 MG 24 hr tablet TAKE 1 TABLET(50 MG) BY MOUTH DAILY WITH OR IMMEDIATELY FOLLOWING A MEAL   pantoprazole (PROTONIX) 40 MG tablet Take 1 tablet (40 mg total) by mouth daily.   rosuvastatin (CRESTOR) 20 MG tablet Take 1 tablet (20 mg total) by mouth daily.   [DISCONTINUED] nitroGLYCERIN (NITROSTAT) 0.4 MG SL tablet Place 1 tablet (0.4 mg total) under the tongue every 5 (five) minutes as needed for chest pain.     Allergies:   Oxycodone-acetaminophen, Hydrocodone-acetaminophen, and Penicillins   Social History   Socioeconomic History   Marital status: Married    Spouse name: Not on file   Number of children: Not on file   Years of education: Not on file   Highest education level: Not on file  Occupational History   Not on file  Tobacco Use   Smoking status: Former    Packs/day: 1.00    Years: 45.00    Pack years: 45.00    Types: Cigarettes    Quit date: 05/05/2018    Years since quitting: 2.6   Smokeless tobacco: Never  Vaping Use   Vaping Use: Never used  Substance and Sexual Activity   Alcohol use: Yes    Comment: 2 cans of beer a week   Drug use: Never   Sexual activity: Yes    Partners: Female  Other Topics Concern   Not on file  Social History Narrative   Not on file   Social Determinants of Health   Financial Resource Strain: Not on file  Food Insecurity: Not on file  Transportation Needs: Not on file  Physical Activity: Not on file  Stress: Not on file  Social Connections: Not  on file     Family History: The patient's family history includes Hypertension in his mother; Stroke in his mother.  ROS:   Please see the history of present illness.    All other systems reviewed and are negative.  EKGs/Labs/Other Studies Reviewed:    The following studies were reviewed today: I discussed my findings with the patient and labs from [place]: 08/13/2020: ALT 46; BUN 16; Creatinine, Ser 1.06; Hemoglobin 16.1; Platelets 176.0; Potassium 4.2; Sodium 140  Recent Lipid Panel    Component Value Date/Time   CHOL 137 08/13/2020 1533   TRIG 232.0 (H) 08/13/2020 1533   HDL 30.70 (L) 08/13/2020 1533   CHOLHDL 4 08/13/2020 1533   VLDL 46.4 (H) 08/13/2020 1533   LDLDIRECT 93.0 08/13/2020 1533  Physical Exam:    VS:  BP 110/64 (BP Location: Right Arm, Patient Position: Sitting, Cuff Size: Large)   Pulse 68   Ht '5\' 4"'$  (1.626 m)   Wt 222 lb 0.6 oz (100.7 kg)   SpO2 93%   BMI 38.11 kg/m     Wt Readings from Last 3 Encounters:  12/19/20 222 lb 0.6 oz (100.7 kg)  08/13/20 224 lb 3.2 oz (101.7 kg)  06/19/20 225 lb 0.6 oz (102.1 kg)     GEN: Patient is in no acute distress HEENT: Normal NECK: No JVD; No carotid bruits LYMPHATICS: No lymphadenopathy CARDIAC: Hear sounds regular, 2/6 systolic murmur at the apex. RESPIRATORY:  Clear to auscultation without rales, wheezing or rhonchi  ABDOMEN: Soft, non-tender, non-distended MUSCULOSKELETAL:  No edema; No deformity  SKIN: Warm and dry NEUROLOGIC:  Alert and oriented x 3 PSYCHIATRIC:  Normal affect   Signed, Jenean Lindau, MD  12/19/2020 9:54 AM    [place]

## 2020-12-19 NOTE — Patient Instructions (Signed)

## 2020-12-23 ENCOUNTER — Other Ambulatory Visit: Payer: Self-pay | Admitting: Family Medicine

## 2020-12-23 DIAGNOSIS — E7800 Pure hypercholesterolemia, unspecified: Secondary | ICD-10-CM

## 2021-01-19 DIAGNOSIS — J432 Centrilobular emphysema: Secondary | ICD-10-CM | POA: Diagnosis not present

## 2021-02-13 ENCOUNTER — Ambulatory Visit: Payer: BC Managed Care – PPO | Admitting: Family Medicine

## 2021-02-18 DIAGNOSIS — J432 Centrilobular emphysema: Secondary | ICD-10-CM | POA: Diagnosis not present

## 2021-03-21 DIAGNOSIS — J432 Centrilobular emphysema: Secondary | ICD-10-CM | POA: Diagnosis not present

## 2021-03-22 ENCOUNTER — Ambulatory Visit: Payer: BC Managed Care – PPO | Admitting: Family Medicine

## 2021-04-11 ENCOUNTER — Other Ambulatory Visit: Payer: Self-pay | Admitting: Pulmonary Disease

## 2021-04-11 DIAGNOSIS — R0982 Postnasal drip: Secondary | ICD-10-CM

## 2021-04-20 DIAGNOSIS — J432 Centrilobular emphysema: Secondary | ICD-10-CM | POA: Diagnosis not present

## 2021-04-25 IMAGING — CT CT CHEST LUNG CANCER SCREENING LOW DOSE W/O CM
1 of 4 series · 10 of 40 positions shown, 13 images · non-contrast
Comparison: Low-dose lung cancer screening CT chest dated
05/05/2018

CLINICAL DATA: 68-year-old male current smoker, former smoker, quit
1 year ago, with 46 pack-year history of smoking, for follow-up lung
cancer screening

EXAM:
CT CHEST WITHOUT CONTRAST LOW-DOSE FOR LUNG CANCER SCREENING
TECHNIQUE: Multidetector CT imaging of the chest was performed following the
standard protocol without IV contrast.

[ct lung segmentation data · axial · 0.80mm/px · z∈[-303,-303]mm · 10 of 288 frames shown]
[frame 1/288  mediastinal]
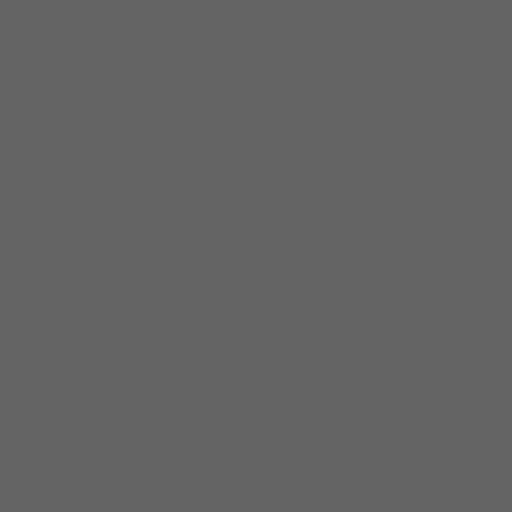
[frame 1/288  lung]
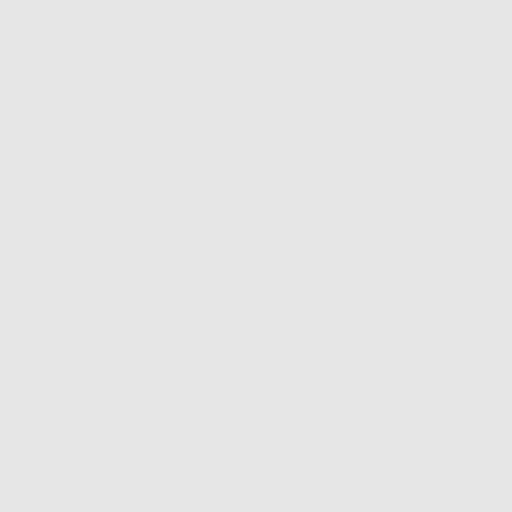
[frame 32/288  lung]
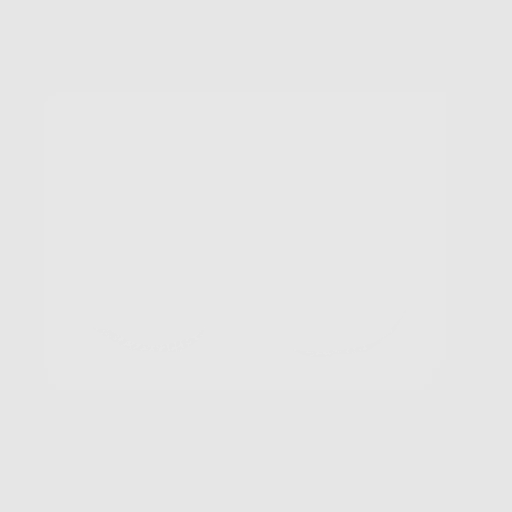
[frame 64/288  lung]
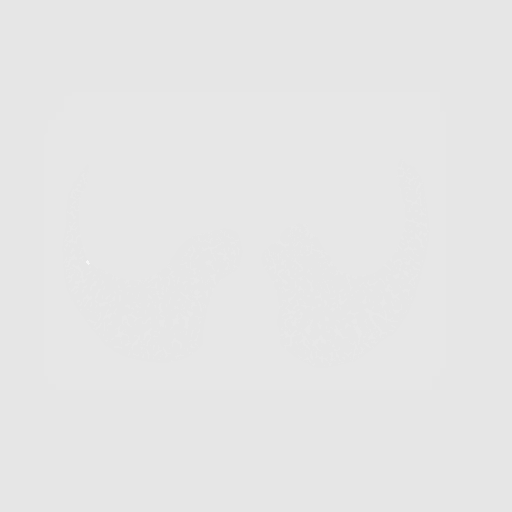
[frame 96/288  lung]
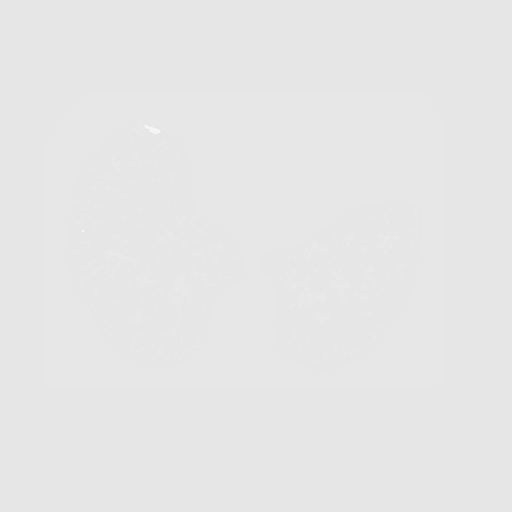
[frame 128/288  mediastinal]
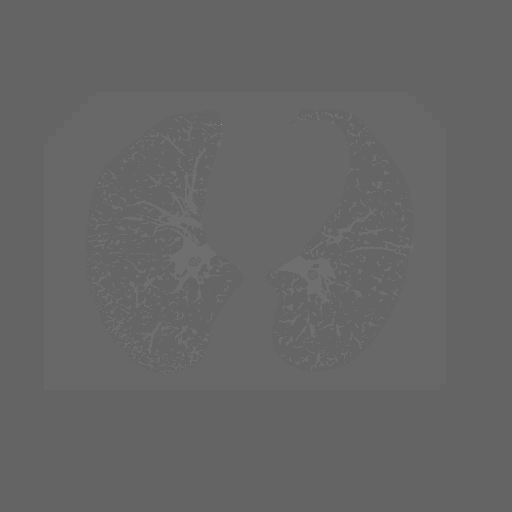
[frame 128/288  lung]
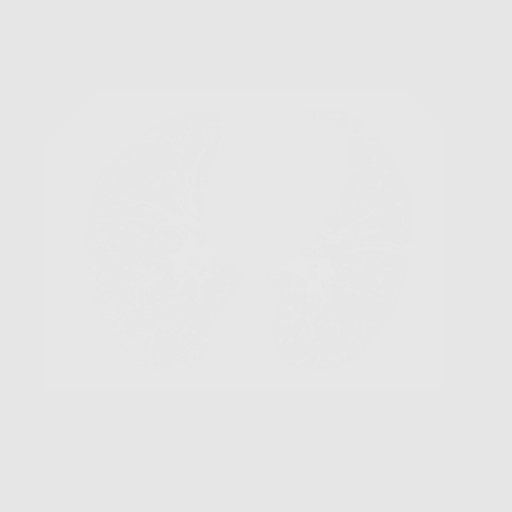
[frame 160/288  lung]
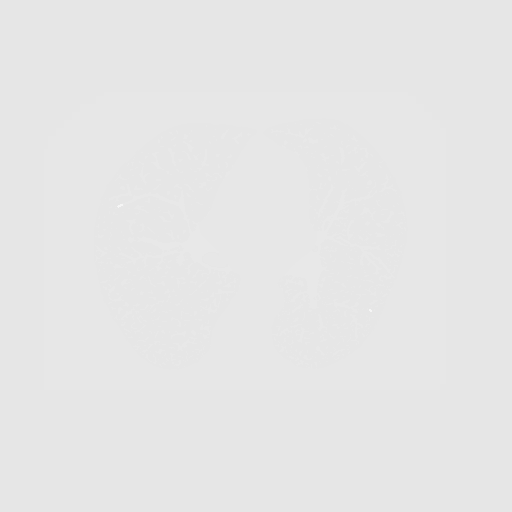
[frame 192/288  lung]
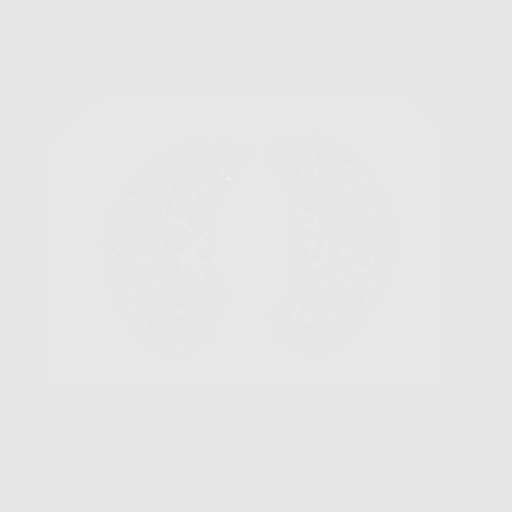
[frame 224/288  lung]
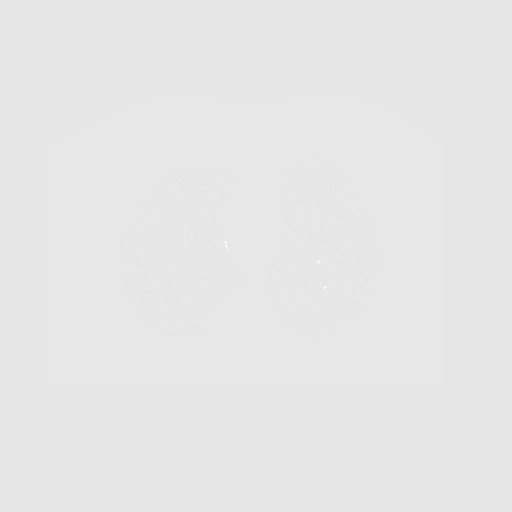
[frame 256/288  mediastinal]
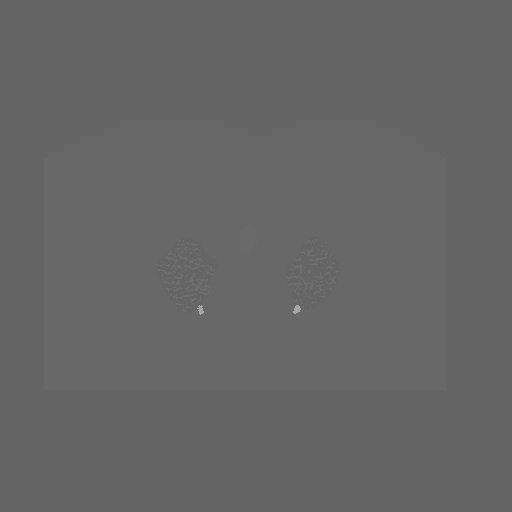
[frame 256/288  lung]
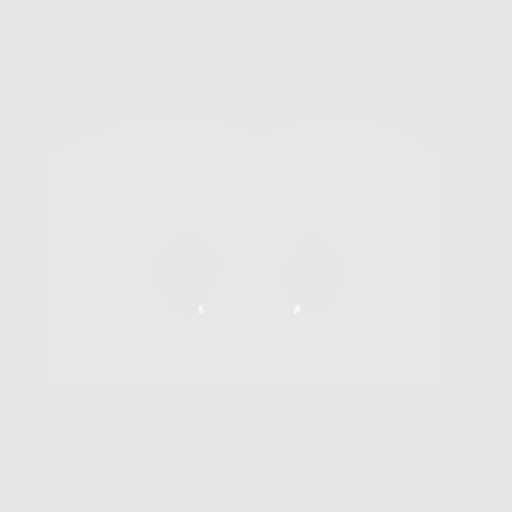
[frame 288/288  lung]
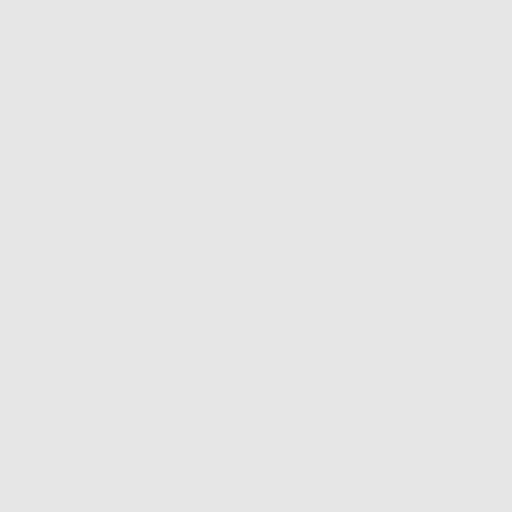

[10 of 40 positions shown; findings below may reference images not displayed]

FINDINGS: Cardiovascular: The heart is normal in size. No pericardial
effusion.

No evidence of thoracic aortic aneurysm. Atherosclerotic
calcifications of the aortic arch.

Mild coronary atherosclerosis of the LAD.

Mediastinum/Nodes: No suspicious mediastinal lymphadenopathy.

Visualized thyroid is unremarkable.

Lungs/Pleura: Moderate centrilobular and paraseptal emphysematous
changes, upper lung predominant.

No focal consolidation. Lingular scarring/atelectasis.

Scattered small bilateral pulmonary nodules, most of which are
subpleural, measuring up to 5.2 mm in the right upper lobe and
mm at the lateral right lung base.

No pleural effusion or pneumothorax.

Upper Abdomen: Visualized upper abdomen is grossly unremarkable.

Musculoskeletal: Degenerative changes of the visualized
thoracolumbar spine.
IMPRESSION: Lung-RADS 2, benign appearance or behavior. Continue annual
screening with low-dose chest CT without contrast in 12 months.

Aortic Atherosclerosis (HWR0H-9ZP.P) and Emphysema ([id number]-N7G.9).

## 2021-05-10 ENCOUNTER — Other Ambulatory Visit: Payer: Self-pay

## 2021-05-10 ENCOUNTER — Ambulatory Visit: Payer: BC Managed Care – PPO | Admitting: Family Medicine

## 2021-05-10 ENCOUNTER — Encounter: Payer: Self-pay | Admitting: Family Medicine

## 2021-05-10 VITALS — BP 126/70 | HR 81 | Temp 97.1°F | Ht 64.0 in | Wt 224.0 lb

## 2021-05-10 DIAGNOSIS — I10 Essential (primary) hypertension: Secondary | ICD-10-CM | POA: Diagnosis not present

## 2021-05-10 DIAGNOSIS — E559 Vitamin D deficiency, unspecified: Secondary | ICD-10-CM

## 2021-05-10 DIAGNOSIS — I2510 Atherosclerotic heart disease of native coronary artery without angina pectoris: Secondary | ICD-10-CM | POA: Diagnosis not present

## 2021-05-10 DIAGNOSIS — E7800 Pure hypercholesterolemia, unspecified: Secondary | ICD-10-CM

## 2021-05-10 DIAGNOSIS — N1831 Chronic kidney disease, stage 3a: Secondary | ICD-10-CM | POA: Diagnosis not present

## 2021-05-10 DIAGNOSIS — E669 Obesity, unspecified: Secondary | ICD-10-CM

## 2021-05-10 MED ORDER — METOPROLOL SUCCINATE ER 50 MG PO TB24: ORAL_TABLET | ORAL | 4 refills | Status: DC

## 2021-05-10 NOTE — Progress Notes (Signed)
Established Patient Office Visit  Subjective:  Patient ID: Justin Boyd, male    DOB: January 27, 1951  Age: 71 y.o. MRN: [id number]  CC:  Chief Complaint  Patient presents with   Follow-up    6 month follow up, no concerns. Patient not fasting.     HPI Justin Boyd presents for follow-up hypertension, mixed hyperlipidemia with history of ASCVD, and stage IIIa CKD.  Difficult for him to exercise recently.  Does not go to the gym by pulmonology.  Difficult to walk in the cold for him.  Confirms that he is taking simvastatin daily.   Past Medical History:  Diagnosis Date   Abnormal CXR 03/30/2018   Anxiety disorder 04/12/2018   Atherosclerosis of coronary artery 05/25/2018   Atherosclerosis of native coronary artery of native heart without angina pectoris 12/30/2018   Atypical squamoproliferative skin lesion 09/01/2019   Barrett's esophagus without dysplasia 06/18/2020   Formatting of this note might be different from the original. last EGD 2 yr ago, per [facility]   Centrilobular emphysema ([place]) 09/06/2018   Chicken pox    CKD (chronic kidney disease), stage III ([place]) 12/23/2018   COPD with acute exacerbation ([place]) 02/14/2019   PFT's 06/2018 FVC (L) 2.65 3.64 72 2.84 78 +7 FEV1 (L) 1.35 2.71 49 1.62 60 +20 FEV1/FVC (%) 51 75 68 57 76 +11 FEV6 (L) 2.59 3.43 75 2.76 80 +6 FEV1/FEV6 (%) 52 78 66 59 75 +12 FEF 25-75% (L/sec) 0.59 2.19 26 0.85 39 +44 FEF Max (L/sec) 5.51 7.53 73 5.26 69 -4 FIVC (L) 2.36 2.72 +15 FIF Max (L/sec) 3.60 3.30 -8 ---- LUNG VOLUMES ---- SVC (L) 3.01 3.64 82 IC (L) 2.26 2.83 79 RV ([place]) (L) 2.37 2.0   Diverticulosis 12/04/2011   Formatting of this note might be different from the original. per colonoscopy report   Elevated BP without diagnosis of hypertension 03/30/2018   Elevated glucose 09/01/2019   Elevated LDL cholesterol level 04/12/2018   Essential hypertension 10/19/2018   Exercise hypoxemia 02/14/2019   Gastroesophageal reflux disease 10/19/2018   Healthcare  maintenance 09/01/2019   History of colonic polyps 06/06/2020   Hyperlipidemia    Need for Tdap vaccination 03/30/2018   Restless leg syndrome 07/30/2018   Sleep walking 10/19/2018   SOB (shortness of breath) on exertion 03/30/2018   Sprain of right wrist 12/30/2018   Vitamin D deficiency 04/12/2018   Wrist pain, acute, right 12/23/2018    Past Surgical History:  Procedure Laterality Date   carpal tunnel both hands  2010   SHOULDER SURGERY Right 2016    Family History  Problem Relation Age of Onset   Hypertension Mother    Stroke Mother     Social History   Socioeconomic History   Marital status: Married    Spouse name: Not on file   Number of children: Not on file   Years of education: Not on file   Highest education level: Not on file  Occupational History   Not on file  Tobacco Use   Smoking status: Former    Packs/day: 1.00    Years: 45.00    Pack years: 45.00    Types: Cigarettes    Quit date: 05/05/2018    Years since quitting: 3.0   Smokeless tobacco: Never  Vaping Use   Vaping Use: Never used  Substance and Sexual Activity   Alcohol use: Yes    Comment: 2 cans of beer a week   Drug use: Never   Sexual  activity: Yes    Partners: Female  Other Topics Concern   Not on file  Social History Narrative   Not on file   Social Determinants of Health   Financial Resource Strain: Not on file  Food Insecurity: Not on file  Transportation Needs: Not on file  Physical Activity: Not on file  Stress: Not on file  Social Connections: Not on file  Intimate Partner Violence: Not on file    Outpatient Medications Prior to Visit  Medication Sig Dispense Refill   albuterol (VENTOLIN HFA) 108 (90 Base) MCG/ACT inhaler Inhale 2 puffs into the lungs every 4 (four) hours as needed for wheezing or shortness of breath. 1 each 5   amLODipine (NORVASC) 5 MG tablet TAKE 1 TABLET(5 MG) BY MOUTH DAILY 90 tablet 4   Fluticasone-Umeclidin-Vilant (TRELEGY ELLIPTA) 100-62.5-25 MCG/INH  AEPB Inhale 1 puff into the lungs daily. 180 each 3   gabapentin (NEURONTIN) 400 MG capsule Take 1 capsule (400 mg total) by mouth 3 (three) times daily. 270 capsule 3   nitroGLYCERIN (NITROSTAT) 0.4 MG SL tablet Place 1 tablet (0.4 mg total) under the tongue every 5 (five) minutes as needed for chest pain. 10 tablet 6   pantoprazole (PROTONIX) 40 MG tablet Take 1 tablet (40 mg total) by mouth daily. 90 tablet 3   rosuvastatin (CRESTOR) 20 MG tablet TAKE 1 TABLET(20 MG) BY MOUTH DAILY 90 tablet 3   metoprolol succinate (TOPROL-XL) 50 MG 24 hr tablet TAKE 1 TABLET(50 MG) BY MOUTH DAILY WITH OR IMMEDIATELY FOLLOWING A MEAL 90 tablet 4   No facility-administered medications prior to visit.    Allergies  Allergen Reactions   Oxycodone-Acetaminophen Other (See Comments)    Insomnia, so is able to take it Insomnia, so is able to take it    Hydrocodone-Acetaminophen Itching    insomnia insomnia    Penicillins Swelling    Extreme soreness Extreme soreness     ROS Review of Systems  Constitutional:  Negative for diaphoresis, fatigue, fever and unexpected weight change.  HENT: Negative.    Eyes:  Negative for photophobia and visual disturbance.  Respiratory:  Positive for shortness of breath. Negative for chest tightness.   Cardiovascular:  Negative for chest pain and palpitations.  Gastrointestinal:  Negative for abdominal pain.  Endocrine: Negative for polyphagia and polyuria.  Genitourinary: Negative.   Musculoskeletal:  Negative for gait problem and joint swelling.  Neurological:  Negative for speech difficulty.     Objective:    Physical Exam Vitals and nursing note reviewed.  Constitutional:      General: He is not in acute distress.    Appearance: Normal appearance. He is obese. He is not ill-appearing, toxic-appearing or diaphoretic.  HENT:     Head: Normocephalic and atraumatic.     Right Ear: Tympanic membrane, ear canal and external ear normal.     Left Ear:  Tympanic membrane, ear canal and external ear normal.     Mouth/Throat:     Mouth: Mucous membranes are dry.     Pharynx: Oropharynx is clear. No oropharyngeal exudate or posterior oropharyngeal erythema.  Eyes:     General: No scleral icterus.       Right eye: No discharge.        Left eye: No discharge.     Extraocular Movements: Extraocular movements intact.     Conjunctiva/sclera: Conjunctivae normal.     Pupils: Pupils are equal, round, and reactive to light.  Neck:     Vascular: No  carotid bruit.  Cardiovascular:     Rate and Rhythm: Normal rate and regular rhythm.  Pulmonary:     Effort: Pulmonary effort is normal. No respiratory distress.     Breath sounds: Normal breath sounds. No wheezing or rales.  Abdominal:     General: Bowel sounds are normal.  Musculoskeletal:     Cervical back: No rigidity or tenderness.  Lymphadenopathy:     Cervical: No cervical adenopathy.  Skin:    General: Skin is warm and dry.  Neurological:     Mental Status: He is alert and oriented to person, place, and time.  Psychiatric:        Mood and Affect: Mood normal.        Behavior: Behavior normal.    BP 126/70 (BP Location: Left Arm, Patient Position: Sitting, Cuff Size: Large)    Pulse 81    Temp (!) 97.1 F (36.2 C) (Temporal)    Ht 5\' 4"  (1.626 m)    Wt 224 lb (101.6 kg)    SpO2 93%    BMI 38.45 kg/m  Wt Readings from Last 3 Encounters:  05/10/21 224 lb (101.6 kg)  12/19/20 222 lb 0.6 oz (100.7 kg)  08/13/20 224 lb 3.2 oz (101.7 kg)     Health Maintenance Due  Topic Date Due   Hepatitis C Screening  Never done    There are no preventive care reminders to display for this patient.  Lab Results  Component Value Date   TSH 1.16 03/29/2018   Lab Results  Component Value Date   WBC 7.6 08/13/2020   HGB 16.1 08/13/2020   HCT 47.9 08/13/2020   MCV 86.9 08/13/2020   PLT 176.0 08/13/2020   Lab Results  Component Value Date   NA 140 08/13/2020   K 4.2 08/13/2020   CO2 25  08/13/2020   GLUCOSE 86 08/13/2020   BUN 16 08/13/2020   CREATININE 1.06 08/13/2020   BILITOT 0.6 08/13/2020   ALKPHOS 85 08/13/2020   AST 23 08/13/2020   ALT 46 08/13/2020   PROT 6.9 08/13/2020   ALBUMIN 4.2 08/13/2020   CALCIUM 9.4 08/13/2020   GFR 72.47 08/13/2020   Lab Results  Component Value Date   CHOL 137 08/13/2020   Lab Results  Component Value Date   HDL 30.70 (L) 08/13/2020   No results found for: [facility] Lab Results  Component Value Date   TRIG 232.0 (H) 08/13/2020   Lab Results  Component Value Date   CHOLHDL 4 08/13/2020   Lab Results  Component Value Date   HGBA1C 6.2 08/13/2020      Assessment & Plan:   Problem List Items Addressed This Visit       Cardiovascular and Mediastinum   Atherosclerosis of coronary artery   Relevant Medications   metoprolol succinate (TOPROL-XL) 50 MG 24 hr tablet   Other Relevant Orders   CBC   Essential hypertension   Relevant Medications   metoprolol succinate (TOPROL-XL) 50 MG 24 hr tablet   Other Relevant Orders   CBC   Comprehensive metabolic panel   Urinalysis, Routine w reflex microscopic   Microalbumin / creatinine urine ratio     Genitourinary   CKD (chronic kidney disease), stage III (HCC) - Primary   Relevant Orders   Comprehensive metabolic panel   Urinalysis, Routine w reflex microscopic   Microalbumin / creatinine urine ratio     Other   Elevated LDL cholesterol level   Relevant Orders   Comprehensive metabolic  panel   LDL cholesterol, direct   Lipid panel   Vitamin D deficiency   Relevant Orders   VITAMIN D 25 Hydroxy (Vit-D Deficiency, Fractures)   Obesity (BMI 35.0-39.9 without comorbidity)    Meds ordered this encounter  Medications   metoprolol succinate (TOPROL-XL) 50 MG 24 hr tablet    Sig: TAKE 1 TABLET(50 MG) BY MOUTH DAILY WITH OR IMMEDIATELY FOLLOWING A MEAL    Dispense:  90 tablet    Refill:  4    **Patient requests 90 days supply**ZERO refills remain on this  prescription. Your patient is requesting advance approval of refills for this medication to [facility]    Follow-up: Return in about 6 months (around 11/07/2021).  Continue all medicines as above.  We will continue weight loss journey and is ready to try intermittent fasting with the 8/14 plan.  Return fasting for above ordered blood work.  Suggested that he may want to cover his mouth with a scarf for walking on cold days.  Encouraged increased water intake.  Information given on exercising for health.   [name], MD

## 2021-05-15 ENCOUNTER — Other Ambulatory Visit (INDEPENDENT_AMBULATORY_CARE_PROVIDER_SITE_OTHER): Payer: BC Managed Care – PPO

## 2021-05-15 ENCOUNTER — Other Ambulatory Visit: Payer: Self-pay

## 2021-05-15 DIAGNOSIS — I2510 Atherosclerotic heart disease of native coronary artery without angina pectoris: Secondary | ICD-10-CM | POA: Diagnosis not present

## 2021-05-15 DIAGNOSIS — I10 Essential (primary) hypertension: Secondary | ICD-10-CM

## 2021-05-15 DIAGNOSIS — N1831 Chronic kidney disease, stage 3a: Secondary | ICD-10-CM

## 2021-05-15 DIAGNOSIS — E7800 Pure hypercholesterolemia, unspecified: Secondary | ICD-10-CM

## 2021-05-15 DIAGNOSIS — E559 Vitamin D deficiency, unspecified: Secondary | ICD-10-CM

## 2021-05-15 LAB — COMPREHENSIVE METABOLIC PANEL
ALT: 44 U/L (ref 0–53)
AST: 25 U/L (ref 0–37)
Albumin: 4.4 g/dL (ref 3.5–5.2)
Alkaline Phosphatase: 70 U/L (ref 39–117)
BUN: 20 mg/dL (ref 6–23)
CO2: 29 mEq/L (ref 19–32)
Calcium: 9.6 mg/dL (ref 8.4–10.5)
Chloride: 103 mEq/L (ref 96–112)
Creatinine, Ser: 1.3 mg/dL (ref 0.40–1.50)
GFR: 56.43 mL/min — ABNORMAL LOW (ref >60.00)
Glucose, Bld: 90 mg/dL (ref 70–99)
Potassium: 4.5 mEq/L (ref 3.5–5.1)
Sodium: 140 mEq/L (ref 135–145)
Total Bilirubin: 0.7 mg/dL (ref 0.2–1.2)
Total Protein: 6.9 g/dL (ref 6.0–8.3)

## 2021-05-15 LAB — CBC
HCT: 50.9 % (ref 39.0–52.0)
Hemoglobin: 16.6 g/dL (ref 13.0–17.0)
MCHC: 32.6 g/dL (ref 30.0–36.0)
MCV: 88.4 fl (ref 78.0–100.0)
Platelets: 175 10*3/uL (ref 150.0–400.0)
RBC: 5.75 Mil/uL (ref 4.22–5.81)
RDW: 14.9 % (ref 11.5–15.5)
WBC: 7.7 10*3/uL (ref 4.0–10.5)

## 2021-05-15 LAB — MICROALBUMIN / CREATININE URINE RATIO
Creatinine,U: 139.9 mg/dL
Microalb Creat Ratio: 0.5 mg/g (ref 0.0–30.0)
Microalb, Ur: <0.7 mg/dL (ref 0.0–1.9)

## 2021-05-15 LAB — URINALYSIS, ROUTINE W REFLEX MICROSCOPIC
Bilirubin Urine: NEGATIVE
Hgb urine dipstick: NEGATIVE
Ketones, ur: NEGATIVE
Leukocytes,Ua: NEGATIVE
Nitrite: NEGATIVE
RBC / HPF: NONE SEEN (ref 0–?)
Specific Gravity, Urine: 1.025 (ref 1.000–1.030)
Total Protein, Urine: NEGATIVE
Urine Glucose: NEGATIVE
Urobilinogen, UA: 0.2 (ref 0.0–1.0)
pH: 6 (ref 5.0–8.0)

## 2021-05-15 LAB — LIPID PANEL
Cholesterol: 141 mg/dL (ref 0–200)
HDL: 35.3 mg/dL — ABNORMAL LOW (ref >39.00)
NonHDL: 105.86
Total CHOL/HDL Ratio: 4
Triglycerides: 229 mg/dL — ABNORMAL HIGH (ref 0.0–149.0)
VLDL: 45.8 mg/dL — ABNORMAL HIGH (ref 0.0–40.0)

## 2021-05-15 LAB — LDL CHOLESTEROL, DIRECT: Direct LDL: 87 mg/dL

## 2021-05-15 LAB — VITAMIN D 25 HYDROXY (VIT D DEFICIENCY, FRACTURES): VITD: 35.82 ng/mL (ref 30.00–100.00)

## 2021-05-16 MED ORDER — ROSUVASTATIN CALCIUM 40 MG PO TABS: 40.0000 mg | ORAL_TABLET | Freq: Every day | ORAL | 3 refills | Status: DC

## 2021-05-16 NOTE — Addendum Note (Signed)
Addended by: Jon Billings on: 05/16/2021 08:02 AM   Modules accepted: Orders

## 2021-05-20 ENCOUNTER — Other Ambulatory Visit: Payer: Self-pay

## 2021-05-20 ENCOUNTER — Ambulatory Visit (HOSPITAL_BASED_OUTPATIENT_CLINIC_OR_DEPARTMENT_OTHER)
Admission: RE | Admit: 2021-05-20 | Discharge: 2021-05-20 | Disposition: A | Discharge status: Home / Self Care | Payer: BC Managed Care – PPO | Source: Ambulatory Visit | Attending: Acute Care | Admitting: Acute Care

## 2021-05-20 DIAGNOSIS — Z122 Encounter for screening for malignant neoplasm of respiratory organs: Secondary | ICD-10-CM | POA: Insufficient documentation

## 2021-05-20 DIAGNOSIS — I700 Atherosclerosis of aorta: Secondary | ICD-10-CM | POA: Diagnosis not present

## 2021-05-20 DIAGNOSIS — Z87891 Personal history of nicotine dependence: Secondary | ICD-10-CM | POA: Diagnosis not present

## 2021-05-20 DIAGNOSIS — J439 Emphysema, unspecified: Secondary | ICD-10-CM | POA: Diagnosis not present

## 2021-05-21 DIAGNOSIS — J432 Centrilobular emphysema: Secondary | ICD-10-CM | POA: Diagnosis not present

## 2021-05-22 ENCOUNTER — Other Ambulatory Visit: Payer: Self-pay

## 2021-05-22 DIAGNOSIS — Z87891 Personal history of nicotine dependence: Secondary | ICD-10-CM

## 2021-06-04 DIAGNOSIS — K219 Gastro-esophageal reflux disease without esophagitis: Secondary | ICD-10-CM | POA: Diagnosis not present

## 2021-06-04 DIAGNOSIS — R109 Unspecified abdominal pain: Secondary | ICD-10-CM | POA: Diagnosis not present

## 2021-06-04 DIAGNOSIS — K2270 Barrett's esophagus without dysplasia: Secondary | ICD-10-CM | POA: Diagnosis not present

## 2021-06-04 DIAGNOSIS — Z86010 Personal history of colonic polyps: Secondary | ICD-10-CM | POA: Diagnosis not present

## 2021-06-11 DIAGNOSIS — K219 Gastro-esophageal reflux disease without esophagitis: Secondary | ICD-10-CM | POA: Diagnosis not present

## 2021-06-11 DIAGNOSIS — K2270 Barrett's esophagus without dysplasia: Secondary | ICD-10-CM | POA: Diagnosis not present

## 2021-06-11 DIAGNOSIS — K429 Umbilical hernia without obstruction or gangrene: Secondary | ICD-10-CM | POA: Insufficient documentation

## 2021-06-11 HISTORY — DX: Umbilical hernia without obstruction or gangrene: K42.9

## 2021-06-17 DIAGNOSIS — K2270 Barrett's esophagus without dysplasia: Secondary | ICD-10-CM | POA: Diagnosis not present

## 2021-06-17 DIAGNOSIS — K420 Umbilical hernia with obstruction, without gangrene: Secondary | ICD-10-CM | POA: Diagnosis not present

## 2021-06-17 DIAGNOSIS — K429 Umbilical hernia without obstruction or gangrene: Secondary | ICD-10-CM | POA: Diagnosis not present

## 2021-06-17 DIAGNOSIS — J449 Chronic obstructive pulmonary disease, unspecified: Secondary | ICD-10-CM | POA: Diagnosis not present

## 2021-06-17 DIAGNOSIS — Z87891 Personal history of nicotine dependence: Secondary | ICD-10-CM | POA: Diagnosis not present

## 2021-06-17 DIAGNOSIS — Z6838 Body mass index (BMI) 38.0-38.9, adult: Secondary | ICD-10-CM | POA: Diagnosis not present

## 2021-06-17 DIAGNOSIS — R9431 Abnormal electrocardiogram [ECG] [EKG]: Secondary | ICD-10-CM | POA: Diagnosis not present

## 2021-06-17 DIAGNOSIS — E669 Obesity, unspecified: Secondary | ICD-10-CM | POA: Diagnosis not present

## 2021-06-21 ENCOUNTER — Ambulatory Visit: Payer: BC Managed Care – PPO | Admitting: Cardiology

## 2021-06-21 ENCOUNTER — Other Ambulatory Visit: Payer: Self-pay

## 2021-06-21 ENCOUNTER — Encounter: Payer: Self-pay | Admitting: Cardiology

## 2021-06-21 VITALS — BP 126/72 | HR 86 | Ht 64.0 in | Wt 221.0 lb

## 2021-06-21 DIAGNOSIS — I10 Essential (primary) hypertension: Secondary | ICD-10-CM | POA: Diagnosis not present

## 2021-06-21 DIAGNOSIS — I2510 Atherosclerotic heart disease of native coronary artery without angina pectoris: Secondary | ICD-10-CM

## 2021-06-21 DIAGNOSIS — E669 Obesity, unspecified: Secondary | ICD-10-CM

## 2021-06-21 DIAGNOSIS — E782 Mixed hyperlipidemia: Secondary | ICD-10-CM | POA: Diagnosis not present

## 2021-06-21 DIAGNOSIS — J432 Centrilobular emphysema: Secondary | ICD-10-CM | POA: Diagnosis not present

## 2021-06-21 NOTE — Patient Instructions (Signed)

## 2021-06-21 NOTE — Progress Notes (Signed)
Cardiology Office Note:    Date:  06/21/2021   ID:  Justin Boyd, DOB [birth year], MRN [id number]  PCP:  [name], MD  Cardiologist:  Jenean Lindau, MD   Referring MD: [name],*    ASSESSMENT:    1. Essential hypertension   2. Mixed hyperlipidemia   3. Obesity (BMI 35.0-39.9 without comorbidity)   4. Atherosclerosis of native coronary artery of native heart without angina pectoris    PLAN:    In order of problems listed above:  Coronary atherosclerosis: Secondary prevention stressed with the patient.  Importance of compliance with diet and medication stressed any vocalized understanding.  He was advised to ambulate to the best of his ability.  He tells me that his hip issues which limits his ambulation. Essential hypertension: Blood pressure stable and I discussed this with him. Mixed dyslipidemia with hypertriglyceridemia: His rosuvastatin has been increased by primary care.  Diet and exercise emphasized I told him to make sure he goes back for follow-up blood work as recommended by primary care he understands and will be proactive with diet also Obesity: Weight reduction stressed risks of obesity explained and he plans to do better. Patient will be seen in follow-up appointment in 9 months or earlier if the patient has any concerns    Medication Adjustments/Labs and Tests Ordered: Current medicines are reviewed at length with the patient today.  Concerns regarding medicines are outlined above.  Orders Placed This Encounter  Procedures   EKG 12-Lead   No orders of the defined types were placed in this encounter.    No chief complaint on file.    History of Present Illness:    Justin Boyd is a 71 y.o. male.  Patient has past medical history of coronary artery atherosclerosis, essential hypertension, mixed dyslipidemia and morbid obesity.  He denies any problems at this time and takes care of activities of daily living.  No chest pain  orthopnea or PND.  He has undergone umbilical hernia surgery.  He is recovering from this.  At the time of my evaluation, the patient is alert awake oriented and in no distress.  Past Medical History:  Diagnosis Date   Abnormal CXR 03/30/2018   Anxiety disorder 04/12/2018   Atherosclerosis of coronary artery 05/25/2018   Atherosclerosis of native coronary artery of native heart without angina pectoris 12/30/2018   Atypical squamoproliferative skin lesion 09/01/2019   Barrett's esophagus without dysplasia 06/18/2020   Formatting of this note might be different from the original. last EGD 2 yr ago, per [facility]   Centrilobular emphysema ([place]) 09/06/2018   Chicken pox    CKD (chronic kidney disease), stage III ([place]) 12/23/2018   COPD with acute exacerbation ([place]) 02/14/2019   PFT's 06/2018 FVC (L) 2.65 3.64 72 2.84 78 +7 FEV1 (L) 1.35 2.71 49 1.62 60 +20 FEV1/FVC (%) 51 75 68 57 76 +11 FEV6 (L) 2.59 3.43 75 2.76 80 +6 FEV1/FEV6 (%) 52 78 66 59 75 +12 FEF 25-75% (L/sec) 0.59 2.19 26 0.85 39 +44 FEF Max (L/sec) 5.51 7.53 73 5.26 69 -4 FIVC (L) 2.36 2.72 +15 FIF Max (L/sec) 3.60 3.30 -8 ---- LUNG VOLUMES ---- SVC (L) 3.01 3.64 82 IC (L) 2.26 2.83 79 RV ([place]) (L) 2.37 2.0   Diverticulosis 12/04/2011   Formatting of this note might be different from the original. per colonoscopy report   Elevated BP without diagnosis of hypertension 03/30/2018   Elevated glucose 09/01/2019   Elevated LDL cholesterol level  04/12/2018   Essential hypertension 10/19/2018   Exercise hypoxemia 02/14/2019   Gastroesophageal reflux disease 10/19/2018   Healthcare maintenance 09/01/2019   Hip pain, bilateral 08/13/2020   History of colonic polyps 06/06/2020   Hyperlipidemia    Need for Tdap vaccination 03/30/2018   Obesity (BMI 35.0-39.9 without comorbidity) 06/19/2020   Pain in both lower extremities 08/13/2020   Restless leg syndrome 07/30/2018   Sleep walking 10/19/2018   SOB (shortness of breath) on exertion 03/30/2018    Sprain of right wrist 07/03/6008   Umbilical hernia without obstruction and without gangrene 06/11/2021   Formatting of this note might be different from the original. Added automatically from request for surgery [id number]   Vitamin D deficiency 04/12/2018   Wrist pain, acute, right 12/23/2018    Past Surgical History:  Procedure Laterality Date   carpal tunnel both hands  2010   SHOULDER SURGERY Right 2016    Current Medications: Current Meds  Medication Sig   albuterol (VENTOLIN HFA) 108 (90 Base) MCG/ACT inhaler Inhale 2 puffs into the lungs every 4 (four) hours as needed for wheezing or shortness of breath.   amLODipine (NORVASC) 5 MG tablet TAKE 1 TABLET(5 MG) BY MOUTH DAILY   Fluticasone-Umeclidin-Vilant (TRELEGY ELLIPTA) 100-62.5-25 MCG/INH AEPB Inhale 1 puff into the lungs daily.   gabapentin (NEURONTIN) 400 MG capsule Take 1 capsule (400 mg total) by mouth 3 (three) times daily.   metoprolol succinate (TOPROL-XL) 50 MG 24 hr tablet TAKE 1 TABLET(50 MG) BY MOUTH DAILY WITH OR IMMEDIATELY FOLLOWING A MEAL   nitroGLYCERIN (NITROSTAT) 0.4 MG SL tablet Place 0.4 mg under the tongue every 5 (five) minutes as needed for chest pain.   pantoprazole (PROTONIX) 40 MG tablet Take 1 tablet (40 mg total) by mouth daily.   rosuvastatin (CRESTOR) 40 MG tablet Take 1 tablet (40 mg total) by mouth daily.     Allergies:   Oxycodone-acetaminophen, Hydrocodone-acetaminophen, and Penicillins   Social History   Socioeconomic History   Marital status: Married    Spouse name: Not on file   Number of children: Not on file   Years of education: Not on file   Highest education level: Not on file  Occupational History   Not on file  Tobacco Use   Smoking status: Former    Packs/day: 1.00    Years: 45.00    Pack years: 45.00    Types: Cigarettes    Quit date: 05/05/2018    Years since quitting: 3.1   Smokeless tobacco: Never  Vaping Use   Vaping Use: Never used  Substance and Sexual Activity    Alcohol use: Yes    Comment: 2 cans of beer a week   Drug use: Never   Sexual activity: Yes    Partners: Female  Other Topics Concern   Not on file  Social History Narrative   Not on file   Social Determinants of Health   Financial Resource Strain: Not on file  Food Insecurity: Not on file  Transportation Needs: Not on file  Physical Activity: Not on file  Stress: Not on file  Social Connections: Not on file     Family History: The patient's family history includes Hypertension in his mother; Stroke in his mother.  ROS:   Please see the history of present illness.    All other systems reviewed and are negative.  EKGs/Labs/Other Studies Reviewed:    The following studies were reviewed today: I discussed my findings with the patient at length.  EKG reveals sinus rhythm poor anterior forces and nonspecific ST-T changes   Recent Labs: 05/15/2021: ALT 44; BUN 20; Creatinine, Ser 1.30; Hemoglobin 16.6; Platelets 175.0; Potassium 4.5; Sodium 140  Recent Lipid Panel    Component Value Date/Time   CHOL 141 05/15/2021 0750   TRIG 229.0 (H) 05/15/2021 0750   HDL 35.30 (L) 05/15/2021 0750   CHOLHDL 4 05/15/2021 0750   VLDL 45.8 (H) 05/15/2021 0750   LDLDIRECT 87.0 05/15/2021 0750    Physical Exam:    VS:  BP 126/72    Pulse 86    Ht 5\' 4"  (1.626 m)    Wt 221 lb (100.2 kg)    SpO2 92%    BMI 37.93 kg/m     Wt Readings from Last 3 Encounters:  06/21/21 221 lb (100.2 kg)  05/10/21 224 lb (101.6 kg)  12/19/20 222 lb 0.6 oz (100.7 kg)     GEN: Patient is in no acute distress HEENT: Normal NECK: No JVD; No carotid bruits LYMPHATICS: No lymphadenopathy CARDIAC: Hear sounds regular, 2/6 systolic murmur at the apex. RESPIRATORY:  Clear to auscultation without rales, wheezing or rhonchi  ABDOMEN: Soft, non-tender, non-distended MUSCULOSKELETAL:  No edema; No deformity  SKIN: Warm and dry NEUROLOGIC:  Alert and oriented x 3 PSYCHIATRIC:  Normal affect   Signed, Jenean Lindau, MD  06/21/2021 8:48 AM    [place]

## 2021-06-28 DIAGNOSIS — E669 Obesity, unspecified: Secondary | ICD-10-CM | POA: Diagnosis not present

## 2021-06-28 DIAGNOSIS — Z09 Encounter for follow-up examination after completed treatment for conditions other than malignant neoplasm: Secondary | ICD-10-CM | POA: Diagnosis not present

## 2021-07-10 DIAGNOSIS — H353131 Nonexudative age-related macular degeneration, bilateral, early dry stage: Secondary | ICD-10-CM | POA: Diagnosis not present

## 2021-07-26 DIAGNOSIS — K2270 Barrett's esophagus without dysplasia: Secondary | ICD-10-CM | POA: Diagnosis not present

## 2021-07-26 DIAGNOSIS — D122 Benign neoplasm of ascending colon: Secondary | ICD-10-CM | POA: Diagnosis not present

## 2021-07-26 DIAGNOSIS — K3189 Other diseases of stomach and duodenum: Secondary | ICD-10-CM | POA: Diagnosis not present

## 2021-07-26 DIAGNOSIS — K635 Polyp of colon: Secondary | ICD-10-CM | POA: Diagnosis not present

## 2021-07-26 DIAGNOSIS — K449 Diaphragmatic hernia without obstruction or gangrene: Secondary | ICD-10-CM | POA: Diagnosis not present

## 2021-07-26 DIAGNOSIS — K2100 Gastro-esophageal reflux disease with esophagitis, without bleeding: Secondary | ICD-10-CM | POA: Diagnosis not present

## 2021-07-26 DIAGNOSIS — K5730 Diverticulosis of large intestine without perforation or abscess without bleeding: Secondary | ICD-10-CM | POA: Diagnosis not present

## 2021-07-26 DIAGNOSIS — K317 Polyp of stomach and duodenum: Secondary | ICD-10-CM | POA: Diagnosis not present

## 2021-07-26 DIAGNOSIS — D125 Benign neoplasm of sigmoid colon: Secondary | ICD-10-CM | POA: Diagnosis not present

## 2021-07-26 DIAGNOSIS — Z86010 Personal history of colonic polyps: Secondary | ICD-10-CM | POA: Diagnosis not present

## 2021-07-26 DIAGNOSIS — K219 Gastro-esophageal reflux disease without esophagitis: Secondary | ICD-10-CM | POA: Diagnosis not present

## 2021-07-26 DIAGNOSIS — Z1211 Encounter for screening for malignant neoplasm of colon: Secondary | ICD-10-CM | POA: Diagnosis not present

## 2021-07-26 DIAGNOSIS — K648 Other hemorrhoids: Secondary | ICD-10-CM | POA: Diagnosis not present

## 2021-08-02 DIAGNOSIS — Z09 Encounter for follow-up examination after completed treatment for conditions other than malignant neoplasm: Secondary | ICD-10-CM | POA: Diagnosis not present

## 2021-08-03 ENCOUNTER — Other Ambulatory Visit: Payer: Self-pay | Admitting: Family Medicine

## 2021-08-03 DIAGNOSIS — K219 Gastro-esophageal reflux disease without esophagitis: Secondary | ICD-10-CM

## 2021-08-14 ENCOUNTER — Ambulatory Visit (INDEPENDENT_AMBULATORY_CARE_PROVIDER_SITE_OTHER): Payer: BC Managed Care – PPO | Admitting: Family Medicine

## 2021-08-14 ENCOUNTER — Encounter: Payer: Self-pay | Admitting: Family Medicine

## 2021-08-14 VITALS — BP 104/66 | HR 57 | Temp 97.4°F | Ht 64.0 in | Wt 215.2 lb

## 2021-08-14 DIAGNOSIS — I2510 Atherosclerotic heart disease of native coronary artery without angina pectoris: Secondary | ICD-10-CM

## 2021-08-14 DIAGNOSIS — I10 Essential (primary) hypertension: Secondary | ICD-10-CM | POA: Diagnosis not present

## 2021-08-14 DIAGNOSIS — N1831 Chronic kidney disease, stage 3a: Secondary | ICD-10-CM | POA: Diagnosis not present

## 2021-08-14 DIAGNOSIS — E669 Obesity, unspecified: Secondary | ICD-10-CM

## 2021-08-14 DIAGNOSIS — E7800 Pure hypercholesterolemia, unspecified: Secondary | ICD-10-CM | POA: Diagnosis not present

## 2021-08-14 LAB — BASIC METABOLIC PANEL
BUN: 19 mg/dL (ref 6–23)
CO2: 26 mEq/L (ref 19–32)
Calcium: 9.2 mg/dL (ref 8.4–10.5)
Chloride: 103 mEq/L (ref 96–112)
Creatinine, Ser: 1.1 mg/dL (ref 0.40–1.50)
GFR: 68.84 mL/min (ref >60.00)
Glucose, Bld: 105 mg/dL — ABNORMAL HIGH (ref 70–99)
Potassium: 4.3 mEq/L (ref 3.5–5.1)
Sodium: 138 mEq/L (ref 135–145)

## 2021-08-14 LAB — URINALYSIS, ROUTINE W REFLEX MICROSCOPIC
Bilirubin Urine: NEGATIVE
Hgb urine dipstick: NEGATIVE
Ketones, ur: NEGATIVE
Leukocytes,Ua: NEGATIVE
Nitrite: NEGATIVE
RBC / HPF: NONE SEEN (ref 0–?)
Specific Gravity, Urine: 1.025 (ref 1.000–1.030)
Total Protein, Urine: NEGATIVE
Urine Glucose: NEGATIVE
Urobilinogen, UA: 0.2 (ref 0.0–1.0)
pH: 6 (ref 5.0–8.0)

## 2021-08-14 LAB — LIPID PANEL
Cholesterol: 130 mg/dL (ref 0–200)
HDL: 34.9 mg/dL — ABNORMAL LOW (ref >39.00)
NonHDL: 95.51
Total CHOL/HDL Ratio: 4
Triglycerides: 220 mg/dL — ABNORMAL HIGH (ref 0.0–149.0)
VLDL: 44 mg/dL — ABNORMAL HIGH (ref 0.0–40.0)

## 2021-08-14 LAB — MICROALBUMIN / CREATININE URINE RATIO
Creatinine,U: 124.8 mg/dL
Microalb Creat Ratio: 0.6 mg/g (ref 0.0–30.0)
Microalb, Ur: <0.7 mg/dL (ref 0.0–1.9)

## 2021-08-14 LAB — LDL CHOLESTEROL, DIRECT: Direct LDL: 73 mg/dL

## 2021-08-14 MED ORDER — AMLODIPINE BESYLATE 5 MG PO TABS: ORAL_TABLET | ORAL | 4 refills | Status: DC

## 2021-08-14 NOTE — Progress Notes (Signed)
? ?Established Patient Office Visit ? ?Subjective:  ?Patient ID: Justin Boyd, male    DOB: July 16, 1950  Age: 71 y.o. MRN: [id number] ? ?CC:  ?Chief Complaint  ?Patient presents with  ? Follow-up  ?  3 month follow up on labs. Patient fasting.   ? ? ?HPI ?Justin Boyd presents for follow-up of hypertension and elevated cholesterol.  Status post recent umbilical hernia repair with mesh.  He has done well with it.  Cannot play golf this summer secondary to this surgery.  He is restricted to lifting 50 pounds.  He asks about his CKD stage IIIa diagnosis. ? ?Past Medical History:  ?Diagnosis Date  ? Abnormal CXR 03/30/2018  ? Anxiety disorder 04/12/2018  ? Atherosclerosis of coronary artery 05/25/2018  ? Atherosclerosis of native coronary artery of native heart without angina pectoris 12/30/2018  ? Atypical squamoproliferative skin lesion 09/01/2019  ? Barrett's esophagus without dysplasia 06/18/2020  ? Formatting of this note might be different from the original. last EGD 2 yr ago, per [facility]  ? Centrilobular emphysema ([place]) 09/06/2018  ? Chicken pox   ? CKD (chronic kidney disease), stage III (Justin Boyd) 12/23/2018  ? COPD with acute exacerbation ([place]) 02/14/2019  ? PFT's 06/2018 FVC (L) 2.65 3.64 72 2.84 78 +7 FEV1 (L) 1.35 2.71 49 1.62 60 +20 FEV1/FVC (%) 51 75 68 57 76 +11 FEV6 (L) 2.59 3.43 75 2.76 80 +6 FEV1/FEV6 (%) 52 78 66 59 75 +12 FEF 25-75% (L/sec) 0.59 2.19 26 0.85 39 +44 FEF Max (L/sec) 5.51 7.53 73 5.26 69 -4 FIVC (L) 2.36 2.72 +15 FIF Max (L/sec) 3.60 3.30 -8 ---- LUNG VOLUMES ---- SVC (L) 3.01 3.64 82 IC (L) 2.26 2.83 79 RV ([place]) (L) 2.37 2.0  ? Diverticulosis 12/04/2011  ? Formatting of this note might be different from the original. per colonoscopy report  ? Elevated BP without diagnosis of hypertension 03/30/2018  ? Elevated glucose 09/01/2019  ? Elevated LDL cholesterol level 04/12/2018  ? Essential hypertension 10/19/2018  ? Exercise hypoxemia 02/14/2019  ? Gastroesophageal reflux disease 10/19/2018  ?  Healthcare maintenance 09/01/2019  ? Hip pain, bilateral 08/13/2020  ? History of colonic polyps 06/06/2020  ? Hyperlipidemia   ? Need for Tdap vaccination 03/30/2018  ? Obesity (BMI 35.0-39.9 without comorbidity) 06/19/2020  ? Pain in both lower extremities 08/13/2020  ? Restless leg syndrome 07/30/2018  ? Sleep walking 10/19/2018  ? SOB (shortness of breath) on exertion 03/30/2018  ? Sprain of right wrist 12/30/2018  ? Umbilical hernia without obstruction and without gangrene 06/11/2021  ? Formatting of this note might be different from the original. Added automatically from request for surgery [id number]  ? Vitamin D deficiency 04/12/2018  ? Wrist pain, acute, right 12/23/2018  ? ? ?Past Surgical History:  ?Procedure Laterality Date  ? carpal tunnel both hands  2010  ? SHOULDER SURGERY Right 2016  ? ? ?Family History  ?Problem Relation Age of Onset  ? Hypertension Mother   ? Stroke Mother   ? ? ?Social History  ? ?Socioeconomic History  ? Marital status: Married  ?  Spouse name: Not on file  ? Number of children: Not on file  ? Years of education: Not on file  ? Highest education level: Not on file  ?Occupational History  ? Not on file  ?Tobacco Use  ? Smoking status: Former  ?  Packs/day: 1.00  ?  Years: 45.00  ?  Pack years: 45.00  ?  Types: Cigarettes  ?  Quit date: 05/05/2018  ?  Years since quitting: 3.2  ? Smokeless tobacco: Never  ?Vaping Use  ? Vaping Use: Never used  ?Substance and Sexual Activity  ? Alcohol use: Yes  ?  Comment: 2 cans of beer a week  ? Drug use: Never  ? Sexual activity: Yes  ?  Partners: Female  ?Other Topics Concern  ? Not on file  ?Social History Narrative  ? Not on file  ? ?Social Determinants of Health  ? ?Financial Resource Strain: Not on file  ?Food Insecurity: Not on file  ?Transportation Needs: Not on file  ?Physical Activity: Not on file  ?Stress: Not on file  ?Social Connections: Not on file  ?Intimate Partner Violence: Not on file  ? ? ?Outpatient Medications Prior to Visit  ?Medication Sig  Dispense Refill  ? albuterol (VENTOLIN HFA) 108 (90 Base) MCG/ACT inhaler Inhale 2 puffs into the lungs every 4 (four) hours as needed for wheezing or shortness of breath. 1 each 5  ? Fluticasone-Umeclidin-Vilant (TRELEGY ELLIPTA) 100-62.5-25 MCG/INH AEPB Inhale 1 puff into the lungs daily. 180 each 3  ? gabapentin (NEURONTIN) 400 MG capsule Take 1 capsule (400 mg total) by mouth 3 (three) times daily. 270 capsule 3  ? metoprolol succinate (TOPROL-XL) 50 MG 24 hr tablet TAKE 1 TABLET(50 MG) BY MOUTH DAILY WITH OR IMMEDIATELY FOLLOWING A MEAL 90 tablet 4  ? nitroGLYCERIN (NITROSTAT) 0.4 MG SL tablet Place 0.4 mg under the tongue every 5 (five) minutes as needed for chest pain.    ? pantoprazole (PROTONIX) 40 MG tablet TAKE 1 TABLET(40 MG) BY MOUTH DAILY 90 tablet 3  ? rosuvastatin (CRESTOR) 40 MG tablet Take 1 tablet (40 mg total) by mouth daily. 90 tablet 3  ? amLODipine (NORVASC) 5 MG tablet TAKE 1 TABLET(5 MG) BY MOUTH DAILY 90 tablet 4  ? ?No facility-administered medications prior to visit.  ? ? ?Allergies  ?Allergen Reactions  ? Oxycodone-Acetaminophen Other (See Comments)  ?  Insomnia, so is able to take it ?  ? Hydrocodone-Acetaminophen Itching  ?  insomnia ? ?  ? Penicillins Swelling  ?  Extreme soreness ?  ? ? ?ROS ?Review of Systems  ?Constitutional:  Negative for diaphoresis, fatigue, fever and unexpected weight change.  ?HENT: Negative.    ?Eyes:  Negative for photophobia and visual disturbance.  ?Respiratory:  Negative for chest tightness, shortness of breath and wheezing.   ?Cardiovascular:  Negative for chest pain.  ?Gastrointestinal:  Negative for abdominal distention.  ?Endocrine: Negative for polyphagia.  ?Genitourinary:  Negative for decreased urine volume, difficulty urinating, frequency and urgency.  ?Musculoskeletal:  Negative for gait problem and joint swelling.  ?Neurological:  Negative for speech difficulty, weakness and light-headedness.  ?Psychiatric/Behavioral: Negative.    ? ?   ?Objective:  ?  ?Physical Exam ?Vitals and nursing note reviewed.  ?Constitutional:   ?   General: He is not in acute distress. ?   Appearance: Normal appearance. He is not ill-appearing or toxic-appearing.  ?HENT:  ?   Head: Normocephalic and atraumatic.  ?   Right Ear: External ear normal.  ?   Left Ear: External ear normal.  ?   Mouth/Throat:  ?   Mouth: Mucous membranes are dry.  ?   Pharynx: Oropharynx is clear. No oropharyngeal exudate or posterior oropharyngeal erythema.  ?Eyes:  ?   Extraocular Movements: Extraocular movements intact.  ?   Conjunctiva/sclera: Conjunctivae normal.  ?   Pupils: Pupils are equal, round, and reactive to  light.  ?Neck:  ?   Vascular: No carotid bruit.  ?Cardiovascular:  ?   Rate and Rhythm: Normal rate and regular rhythm.  ?Pulmonary:  ?   Effort: Pulmonary effort is normal.  ?   Breath sounds: Normal breath sounds.  ?Abdominal:  ?   General: Bowel sounds are normal. There is no distension.  ?   Palpations: There is no mass.  ?   Tenderness: There is no abdominal tenderness. There is no guarding or rebound.  ?   Hernia: No hernia is present.  ? ? ?Musculoskeletal:  ?   Cervical back: No rigidity or tenderness.  ?Lymphadenopathy:  ?   Cervical: No cervical adenopathy.  ?Skin: ?   General: Skin is warm and dry.  ?Neurological:  ?   Mental Status: He is alert and oriented to person, place, and time.  ?Psychiatric:     ?   Mood and Affect: Mood normal.     ?   Behavior: Behavior normal.  ? ? ?BP 104/66 (BP Location: Right Arm, Patient Position: Sitting, Cuff Size: Normal)   Pulse (!) 57   Temp (!) 97.4 ?F (36.3 ?C) (Temporal)   Ht '5\' 4"'$  (1.626 m)   Wt 215 lb 3.2 oz (97.6 kg)   SpO2 94%   BMI 36.94 kg/m?  ?Wt Readings from Last 3 Encounters:  ?08/14/21 215 lb 3.2 oz (97.6 kg)  ?06/21/21 221 lb (100.2 kg)  ?05/10/21 224 lb (101.6 kg)  ? ? ? ?Health Maintenance Due  ?Topic Date Due  ? Hepatitis C Screening  Never done  ? ? ?There are no preventive care reminders to display for  this patient. ? ?Lab Results  ?Component Value Date  ? TSH 1.16 03/29/2018  ? ?Lab Results  ?Component Value Date  ? WBC 7.7 05/15/2021  ? HGB 16.6 05/15/2021  ? HCT 50.9 05/15/2021  ? MCV 88.4 05/15/2021  ? PLT 175.0 01/1

## 2021-09-17 ENCOUNTER — Other Ambulatory Visit: Payer: Self-pay | Admitting: Family Medicine

## 2021-09-17 DIAGNOSIS — R0982 Postnasal drip: Secondary | ICD-10-CM

## 2021-10-19 DIAGNOSIS — J432 Centrilobular emphysema: Secondary | ICD-10-CM | POA: Diagnosis not present

## 2021-11-01 ENCOUNTER — Telehealth: Payer: Self-pay | Admitting: Family Medicine

## 2021-11-01 NOTE — Telephone Encounter (Signed)
Patient verbally understood urine should be collected at the office.

## 2021-11-01 NOTE — Telephone Encounter (Signed)
Spoke with patient who's asking if he could stop by for a cup to collect urine prior to his visit and bring it in when he comes in for his visit?

## 2021-11-01 NOTE — Telephone Encounter (Signed)
Pt is wanting to know if he is able to pick up a urine kit before his appointment with Dr. Ethelene Hal on 11/06/21. Please advise pt at [phone]

## 2021-11-06 ENCOUNTER — Ambulatory Visit: Payer: BC Managed Care – PPO | Admitting: Family Medicine

## 2021-11-17 ENCOUNTER — Other Ambulatory Visit: Payer: Self-pay | Admitting: Family Medicine

## 2021-11-17 DIAGNOSIS — I2510 Atherosclerotic heart disease of native coronary artery without angina pectoris: Secondary | ICD-10-CM

## 2021-11-17 DIAGNOSIS — I10 Essential (primary) hypertension: Secondary | ICD-10-CM

## 2021-11-18 DIAGNOSIS — J432 Centrilobular emphysema: Secondary | ICD-10-CM | POA: Diagnosis not present

## 2021-11-25 ENCOUNTER — Encounter: Payer: Self-pay | Admitting: Dermatology

## 2021-11-25 ENCOUNTER — Ambulatory Visit (INDEPENDENT_AMBULATORY_CARE_PROVIDER_SITE_OTHER): Payer: BC Managed Care – PPO | Admitting: Dermatology

## 2021-11-25 DIAGNOSIS — Z1283 Encounter for screening for malignant neoplasm of skin: Secondary | ICD-10-CM | POA: Diagnosis not present

## 2021-11-25 DIAGNOSIS — L281 Prurigo nodularis: Secondary | ICD-10-CM | POA: Diagnosis not present

## 2021-11-25 DIAGNOSIS — L821 Other seborrheic keratosis: Secondary | ICD-10-CM

## 2021-12-11 ENCOUNTER — Other Ambulatory Visit: Payer: Self-pay | Admitting: Family Medicine

## 2021-12-11 DIAGNOSIS — G2581 Restless legs syndrome: Secondary | ICD-10-CM

## 2021-12-14 ENCOUNTER — Encounter: Payer: Self-pay | Admitting: Dermatology

## 2021-12-14 NOTE — Progress Notes (Signed)
   Follow-Up Visit   Subjective  Justin Boyd is a 71 y.o. male who presents for the following: Annual Exam (Patient here today for yearly skin check, no concerns. No personal history or family history of atypical moles, melanoma or non mole skin cancer. ).  General skin examination Location:  Duration:  Quality:  Associated Signs/Symptoms: Modifying Factors:  Severity:  Timing: Context:   Objective  Well appearing patient in no apparent distress; mood and affect are within normal limits. No atypical nevi (all pigmented lesions checked with dermoscopy) or signs of NMSC noted at the time of the visit.   Left Upper Eyelid, Right Breast, Right Thigh - Anterior, Right Upper Back 5 mm brown flattopped textured papules  Mid Chin Lichenified 3 mm pink papule which patient knows that he picks    All skin waist up examined.   Assessment & Plan    Seborrheic keratosis (4) Right Thigh - Anterior; Right Breast; Right Upper Back; Left Upper Eyelid  Benign okay to leave if stable  Picker's nodule Mid Chin  Offered to obtain confirmatory biopsy but patient chooses to leave it unless there is clinical change  Skin exam for malignant neoplasm  Yearly skin check.  Encouraged to self examine twice annually      I, Lavonna Monarch, MD, have reviewed all documentation for this visit.  The documentation on 12/14/21 for the exam, diagnosis, procedures, and orders are all accurate and complete.

## 2021-12-18 ENCOUNTER — Encounter: Payer: Self-pay | Admitting: Family Medicine

## 2021-12-18 ENCOUNTER — Ambulatory Visit (INDEPENDENT_AMBULATORY_CARE_PROVIDER_SITE_OTHER): Payer: BC Managed Care – PPO | Admitting: Family Medicine

## 2021-12-18 VITALS — BP 108/66 | HR 57 | Temp 97.0°F | Wt 215.0 lb

## 2021-12-18 DIAGNOSIS — Z125 Encounter for screening for malignant neoplasm of prostate: Secondary | ICD-10-CM | POA: Diagnosis not present

## 2021-12-18 DIAGNOSIS — E669 Obesity, unspecified: Secondary | ICD-10-CM | POA: Diagnosis not present

## 2021-12-18 DIAGNOSIS — I10 Essential (primary) hypertension: Secondary | ICD-10-CM | POA: Diagnosis not present

## 2021-12-18 DIAGNOSIS — E7800 Pure hypercholesterolemia, unspecified: Secondary | ICD-10-CM | POA: Diagnosis not present

## 2021-12-18 LAB — BASIC METABOLIC PANEL
BUN: 18 mg/dL (ref 6–23)
CO2: 25 mEq/L (ref 19–32)
Calcium: 9.1 mg/dL (ref 8.4–10.5)
Chloride: 104 mEq/L (ref 96–112)
Creatinine, Ser: 1.07 mg/dL (ref 0.40–1.50)
GFR: 70.99 mL/min (ref >60.00)
Glucose, Bld: 104 mg/dL — ABNORMAL HIGH (ref 70–99)
Potassium: 4.3 mEq/L (ref 3.5–5.1)
Sodium: 139 mEq/L (ref 135–145)

## 2021-12-18 LAB — LIPID PANEL
Cholesterol: 117 mg/dL (ref 0–200)
HDL: 32.7 mg/dL — ABNORMAL LOW (ref >39.00)
LDL Cholesterol: 51 mg/dL (ref 0–99)
NonHDL: 84.5
Total CHOL/HDL Ratio: 4
Triglycerides: 166 mg/dL — ABNORMAL HIGH (ref 0.0–149.0)
VLDL: 33.2 mg/dL (ref 0.0–40.0)

## 2021-12-18 LAB — CBC
HCT: 48.8 % (ref 39.0–52.0)
Hemoglobin: 16.1 g/dL (ref 13.0–17.0)
MCHC: 33 g/dL (ref 30.0–36.0)
MCV: 87.9 fl (ref 78.0–100.0)
Platelets: 149 10*3/uL — ABNORMAL LOW (ref 150.0–400.0)
RBC: 5.55 Mil/uL (ref 4.22–5.81)
RDW: 14.5 % (ref 11.5–15.5)
WBC: 6.8 10*3/uL (ref 4.0–10.5)

## 2021-12-18 LAB — PSA: PSA: 0.55 ng/mL (ref 0.10–4.00)

## 2021-12-18 NOTE — Progress Notes (Signed)
Established Patient Office Visit  Subjective   Patient ID: Justin Boyd, male    DOB: 07/24/1950  Age: 71 y.o. MRN: [id number]  Chief Complaint  Patient presents with   Follow-up    6 month follow up, no concerns. Patient fasting.     HPI follow-up of hypertension, elevated cholesterol with a history of coronary artery disease and obesity.  Has been on a low-fat low-cholesterol diet and been able to lose some weight.  Reluctant to exercise because of the air quality.  Continues with rosuvastatin 40 mg for elevated cholesterol and amlodipine with metoprolol for control of his blood pressure and history of coronary artery disease.    Review of Systems  Constitutional: Negative.   HENT: Negative.    Eyes:  Negative for blurred vision, discharge and redness.  Respiratory: Negative.    Cardiovascular: Negative.   Gastrointestinal:  Negative for abdominal pain.  Genitourinary: Negative.   Musculoskeletal: Negative.  Negative for myalgias.  Skin:  Negative for rash.  Neurological:  Negative for tingling, loss of consciousness and weakness.  Endo/Heme/Allergies:  Negative for polydipsia.      12/18/2021    8:07 AM 08/14/2021    9:30 AM 05/10/2021    9:31 AM  Depression screen PHQ 2/9  Decreased Interest 0 0 0  Down, Depressed, Hopeless 0 0 0  PHQ - 2 Score 0 0 0       Objective:     BP 108/66 (BP Location: Right Arm, Patient Position: Sitting, Cuff Size: Large)   Pulse (!) 57   Temp (!) 97 F (36.1 C) (Temporal)   Wt 215 lb (97.5 kg)   SpO2 90%   BMI 36.90 kg/m  Wt Readings from Last 3 Encounters:  12/18/21 215 lb (97.5 kg)  08/14/21 215 lb 3.2 oz (97.6 kg)  06/21/21 221 lb (100.2 kg)      Physical Exam Constitutional:      General: He is not in acute distress.    Appearance: Normal appearance. He is not ill-appearing, toxic-appearing or diaphoretic.  HENT:     Head: Normocephalic and atraumatic.     Right Ear: External ear normal.     Left Ear: External ear  normal.     Mouth/Throat:     Mouth: Mucous membranes are moist.     Pharynx: Oropharynx is clear. No oropharyngeal exudate or posterior oropharyngeal erythema.  Eyes:     General: No scleral icterus.       Right eye: No discharge.        Left eye: No discharge.     Extraocular Movements: Extraocular movements intact.     Conjunctiva/sclera: Conjunctivae normal.     Pupils: Pupils are equal, round, and reactive to light.  Cardiovascular:     Rate and Rhythm: Normal rate and regular rhythm.  Pulmonary:     Effort: Pulmonary effort is normal. No respiratory distress.     Breath sounds: Normal breath sounds.  Abdominal:     General: Bowel sounds are normal.     Tenderness: There is no guarding.  Musculoskeletal:     Cervical back: No rigidity or tenderness.  Skin:    General: Skin is warm and dry.  Neurological:     Mental Status: He is alert and oriented to person, place, and time.  Psychiatric:        Mood and Affect: Mood normal.        Behavior: Behavior normal.      No results found for  any visits on 12/18/21.    The 10-year ASCVD risk score (Arnett DK, et al., 2019) is: 13.9%    Assessment & Plan:   Problem List Items Addressed This Visit       Cardiovascular and Mediastinum   Essential hypertension - Primary   Relevant Orders   Basic metabolic panel   CBC     Other   Elevated LDL cholesterol level   Relevant Orders   Lipid panel   Obesity (BMI 35.0-39.9 without comorbidity)   Other Visit Diagnoses     Screening for prostate cancer       Relevant Orders   PSA       Return in about 6 months (around 06/20/2022).  May consider adding Zetia.  Encouraged him to to continue his weight loss efforts.  He is not interested in a bariatric surgery consult.  [name], MD

## 2021-12-19 DIAGNOSIS — J432 Centrilobular emphysema: Secondary | ICD-10-CM | POA: Diagnosis not present

## 2021-12-31 ENCOUNTER — Ambulatory Visit (INDEPENDENT_AMBULATORY_CARE_PROVIDER_SITE_OTHER): Payer: BC Managed Care – PPO

## 2021-12-31 DIAGNOSIS — Z23 Encounter for immunization: Secondary | ICD-10-CM

## 2021-12-31 NOTE — Progress Notes (Signed)
After obtaining consent, and per orders of Dr. Ethelene Hal, injection of Influeza given by Augustina Mood. Patient instructed to remain in clinic for 20 minutes afterwards, and to report any adverse reaction to me immediately.

## 2022-01-19 DIAGNOSIS — J432 Centrilobular emphysema: Secondary | ICD-10-CM | POA: Diagnosis not present

## 2022-02-18 DIAGNOSIS — J432 Centrilobular emphysema: Secondary | ICD-10-CM | POA: Diagnosis not present

## 2022-02-19 DIAGNOSIS — H269 Unspecified cataract: Secondary | ICD-10-CM | POA: Diagnosis not present

## 2022-02-19 DIAGNOSIS — H2511 Age-related nuclear cataract, right eye: Secondary | ICD-10-CM | POA: Diagnosis not present

## 2022-02-19 DIAGNOSIS — I10 Essential (primary) hypertension: Secondary | ICD-10-CM | POA: Diagnosis not present

## 2022-03-12 DIAGNOSIS — J449 Chronic obstructive pulmonary disease, unspecified: Secondary | ICD-10-CM | POA: Diagnosis not present

## 2022-03-12 DIAGNOSIS — H2512 Age-related nuclear cataract, left eye: Secondary | ICD-10-CM | POA: Diagnosis not present

## 2022-03-12 DIAGNOSIS — H269 Unspecified cataract: Secondary | ICD-10-CM | POA: Diagnosis not present

## 2022-03-21 DIAGNOSIS — J432 Centrilobular emphysema: Secondary | ICD-10-CM | POA: Diagnosis not present

## 2022-04-20 DIAGNOSIS — J432 Centrilobular emphysema: Secondary | ICD-10-CM | POA: Diagnosis not present

## 2022-05-08 DIAGNOSIS — Z713 Dietary counseling and surveillance: Secondary | ICD-10-CM | POA: Diagnosis not present

## 2022-05-20 ENCOUNTER — Ambulatory Visit (HOSPITAL_BASED_OUTPATIENT_CLINIC_OR_DEPARTMENT_OTHER): Payer: BC Managed Care – PPO

## 2022-05-22 ENCOUNTER — Ambulatory Visit (HOSPITAL_BASED_OUTPATIENT_CLINIC_OR_DEPARTMENT_OTHER)
Admission: RE | Admit: 2022-05-22 | Discharge: 2022-05-22 | Disposition: A | Discharge status: Home / Self Care | Payer: BC Managed Care – PPO | Source: Ambulatory Visit | Attending: Acute Care | Admitting: Acute Care

## 2022-05-22 DIAGNOSIS — Z87891 Personal history of nicotine dependence: Secondary | ICD-10-CM

## 2022-05-23 ENCOUNTER — Other Ambulatory Visit: Payer: Self-pay

## 2022-05-23 ENCOUNTER — Telehealth: Payer: Self-pay

## 2022-05-23 DIAGNOSIS — Z87891 Personal history of nicotine dependence: Secondary | ICD-10-CM

## 2022-05-23 DIAGNOSIS — Z122 Encounter for screening for malignant neoplasm of respiratory organs: Secondary | ICD-10-CM

## 2022-05-23 NOTE — Patient Outreach (Signed)
  Care Coordination   05/23/2022 Name: Rosbel Buckner MRN: [id number] DOB: 03/11/1951   Care Coordination Outreach Attempts:  An unsuccessful telephone outreach was attempted today to offer the patient information about available care coordination services as a benefit of their health plan.   Follow Up Plan:  Additional outreach attempts will be made to offer the patient care coordination information and services.   Encounter Outcome:  No Answer   Care Coordination Interventions:  No, not indicated    Jone Baseman, RN, MSN [facility] Care Management Coordinator Direct Line [phone]

## 2022-05-26 ENCOUNTER — Telehealth: Payer: Self-pay

## 2022-05-26 NOTE — Patient Outreach (Signed)
  Care Coordination   Initial Visit Note   05/26/2022 Name: Justin Boyd MRN: [id number] DOB: Jan 24, 1951  Justin Boyd is a 72 y.o. year old male who sees [name], MD for primary care. I spoke with  Ranae Palms by phone today.  What matters to the patients health and wellness today?  none    Goals Addressed   None     SDOH assessments and interventions completed:  Yes     Care Coordination Interventions:  Yes, provided   Follow up plan: No further intervention required.   Encounter Outcome:  Pt. Visit Completed   Jone Baseman, RN, MSN [place] [facility] Care Management Coordinator Direct Line [phone]

## 2022-05-26 NOTE — Patient Instructions (Signed)
Visit Information  Thank you for taking time to visit with me today. Please don't hesitate to contact me if I can be of assistance to you.   Following are the goals we discussed today:   Goals Addressed             This Visit's Progress    COMPLETED: Care Coordination Activities-No follw up required       Care Coordination Interventions: Advised patient to Annual Wellness exam. Discussed [facility] services and support. Assessed SDOH. Advised to discuss with primary care physician if services needed in the future.           If you are experiencing a Mental Health or [place] or need someone to talk to, please call the Suicide and Crisis Lifeline: 988   Patient verbalizes understanding of instructions and care plan provided today and agrees to view in [place]. Active MyChart status and patient understanding of how to access instructions and care plan via MyChart confirmed with patient.     No further follow up required: Decline  Jone Baseman, RN, MSN [place] [facility] Care Management Coordinator Direct Line [phone]

## 2022-06-04 DIAGNOSIS — Z713 Dietary counseling and surveillance: Secondary | ICD-10-CM | POA: Diagnosis not present

## 2022-06-15 ENCOUNTER — Other Ambulatory Visit: Payer: Self-pay | Admitting: Family Medicine

## 2022-06-15 DIAGNOSIS — I2510 Atherosclerotic heart disease of native coronary artery without angina pectoris: Secondary | ICD-10-CM

## 2022-06-15 DIAGNOSIS — I10 Essential (primary) hypertension: Secondary | ICD-10-CM

## 2022-06-20 ENCOUNTER — Ambulatory Visit: Payer: BC Managed Care – PPO | Admitting: Family Medicine

## 2022-06-20 ENCOUNTER — Ambulatory Visit: Payer: BC Managed Care – PPO | Admitting: Pulmonary Disease

## 2022-06-20 ENCOUNTER — Encounter: Payer: Self-pay | Admitting: Pulmonary Disease

## 2022-06-20 ENCOUNTER — Encounter: Payer: Self-pay | Admitting: Family Medicine

## 2022-06-20 VITALS — BP 120/74 | HR 58 | Ht 64.0 in | Wt 215.0 lb

## 2022-06-20 VITALS — BP 118/70 | HR 59 | Temp 97.2°F | Ht 64.0 in | Wt 214.4 lb

## 2022-06-20 DIAGNOSIS — R5383 Other fatigue: Secondary | ICD-10-CM

## 2022-06-20 DIAGNOSIS — R0602 Shortness of breath: Secondary | ICD-10-CM | POA: Diagnosis not present

## 2022-06-20 DIAGNOSIS — N1831 Chronic kidney disease, stage 3a: Secondary | ICD-10-CM

## 2022-06-20 DIAGNOSIS — I10 Essential (primary) hypertension: Secondary | ICD-10-CM

## 2022-06-20 DIAGNOSIS — E669 Obesity, unspecified: Secondary | ICD-10-CM | POA: Diagnosis not present

## 2022-06-20 DIAGNOSIS — M25551 Pain in right hip: Secondary | ICD-10-CM | POA: Diagnosis not present

## 2022-06-20 DIAGNOSIS — M25552 Pain in left hip: Secondary | ICD-10-CM

## 2022-06-20 HISTORY — DX: Other fatigue: R53.83

## 2022-06-20 LAB — TSH: TSH: 1.18 u[IU]/mL (ref 0.35–5.50)

## 2022-06-20 LAB — BASIC METABOLIC PANEL
BUN: 22 mg/dL (ref 6–23)
CO2: 24 mEq/L (ref 19–32)
Calcium: 9.7 mg/dL (ref 8.4–10.5)
Chloride: 104 mEq/L (ref 96–112)
Creatinine, Ser: 1.08 mg/dL (ref 0.40–1.50)
GFR: 69.95 mL/min (ref >60.00)
Glucose, Bld: 98 mg/dL (ref 70–99)
Potassium: 4.4 mEq/L (ref 3.5–5.1)
Sodium: 140 mEq/L (ref 135–145)

## 2022-06-20 LAB — CBC
HCT: 48.8 % (ref 39.0–52.0)
Hemoglobin: 16.9 g/dL (ref 13.0–17.0)
MCHC: 34.6 g/dL (ref 30.0–36.0)
MCV: 86.9 fl (ref 78.0–100.0)
Platelets: 154 10*3/uL (ref 150.0–400.0)
RBC: 5.62 Mil/uL (ref 4.22–5.81)
RDW: 13.6 % (ref 11.5–15.5)
WBC: 6.2 10*3/uL (ref 4.0–10.5)

## 2022-06-20 MED ORDER — TRELEGY ELLIPTA 200-62.5-25 MCG/ACT IN AEPB: 1.0000 | INHALATION_SPRAY | Freq: Every day | RESPIRATORY_TRACT | 5 refills | Status: DC

## 2022-06-20 NOTE — Progress Notes (Signed)
Established Patient Office Visit   Subjective:  Patient ID: Justin Boyd, male    DOB: Apr 22, 1951  Age: 72 y.o. MRN: [id number]  Chief Complaint  Patient presents with   Medical Management of Chronic Issues    6 month follow up, no concerns. Patient not fasting.     HPI Encounter Diagnoses  Name Primary?   Essential hypertension Yes   Obesity (BMI 35.0-39.9 without comorbidity)    Stage 3a chronic kidney disease (HCC)    Hip pain, bilateral    Other fatigue    For follow-up of above.  Brings in an extensive list of blood pressures in the 1 teens over 60s to 73s.  Continues to work with a nutritionist.  He is walking 08-5998 steps 4 days a week.  Hip pain is worsening.  Shortness of breath has been a factor with his exercise tolerance.  He is seeing a pulmonologist this afternoon.  He experiences fatigue with walking.   Review of Systems  Constitutional:  Positive for malaise/fatigue. Negative for diaphoresis.  HENT: Negative.    Eyes:  Negative for blurred vision, discharge and redness.  Respiratory:  Positive for shortness of breath.   Cardiovascular: Negative.   Gastrointestinal:  Negative for abdominal pain, nausea and vomiting.  Genitourinary: Negative.   Musculoskeletal:  Positive for joint pain. Negative for myalgias.  Skin:  Negative for rash.  Neurological:  Negative for tingling, loss of consciousness and weakness.  Endo/Heme/Allergies:  Negative for polydipsia.      06/20/2022    8:03 AM 05/26/2022   11:39 AM 12/18/2021    8:07 AM  Depression screen PHQ 2/9  Decreased Interest 0 0 0  Down, Depressed, Hopeless 0 0 0  PHQ - 2 Score 0 0 0       Current Outpatient Medications:    albuterol (VENTOLIN HFA) 108 (90 Base) MCG/ACT inhaler, Inhale 2 puffs into the lungs every 4 (four) hours as needed for wheezing or shortness of breath., Disp: 1 each, Rfl: 5   amLODipine (NORVASC) 5 MG tablet, TAKE 1 TABLET(5 MG) BY MOUTH DAILY, Disp: 90 tablet, Rfl: 4    gabapentin (NEURONTIN) 400 MG capsule, TAKE 1 CAPSULE(400 MG) BY MOUTH THREE TIMES DAILY, Disp: 270 capsule, Rfl: 3   metoprolol succinate (TOPROL-XL) 50 MG 24 hr tablet, TAKE 1 TABLET(50 MG) BY MOUTH DAILY WITH OR IMMEDIATELY FOLLOWING A MEAL, Disp: 90 tablet, Rfl: 4   nitroGLYCERIN (NITROSTAT) 0.4 MG SL tablet, Place 0.4 mg under the tongue every 5 (five) minutes as needed for chest pain., Disp: , Rfl:    pantoprazole (PROTONIX) 40 MG tablet, TAKE 1 TABLET(40 MG) BY MOUTH DAILY, Disp: 90 tablet, Rfl: 3   rosuvastatin (CRESTOR) 40 MG tablet, Take 1 tablet (40 mg total) by mouth daily., Disp: 90 tablet, Rfl: 3   TRELEGY ELLIPTA 100-62.5-25 MCG/ACT AEPB, INHALE 1 PUFF INTO THE LUNGS DAILY, Disp: 180 each, Rfl: 3   Objective:     BP 118/70 (BP Location: Left Arm, Patient Position: Sitting, Cuff Size: Normal)   Pulse (!) 59   Temp (!) 97.2 F (36.2 C) (Temporal)   Ht 5' 4"$  (1.626 m)   Wt 214 lb 6.4 oz (97.3 kg)   SpO2 91%   BMI 36.80 kg/m  BP Readings from Last 3 Encounters:  06/20/22 118/70  12/18/21 108/66  08/14/21 104/66   Wt Readings from Last 3 Encounters:  06/20/22 214 lb 6.4 oz (97.3 kg)  12/18/21 215 lb (97.5 kg)  08/14/21 215 lb  3.2 oz (97.6 kg)      Physical Exam Constitutional:      General: He is not in acute distress.    Appearance: Normal appearance. He is not ill-appearing, toxic-appearing or diaphoretic.  HENT:     Head: Normocephalic and atraumatic.     Right Ear: External ear normal.     Left Ear: External ear normal.     Mouth/Throat:     Mouth: Mucous membranes are moist.     Pharynx: Oropharynx is clear. No oropharyngeal exudate or posterior oropharyngeal erythema.  Eyes:     General: No scleral icterus.       Right eye: No discharge.        Left eye: No discharge.     Extraocular Movements: Extraocular movements intact.     Conjunctiva/sclera: Conjunctivae normal.     Pupils: Pupils are equal, round, and reactive to light.  Cardiovascular:      Rate and Rhythm: Normal rate and regular rhythm.     Heart sounds: Heart sounds are distant.  Pulmonary:     Effort: Pulmonary effort is normal. No respiratory distress.     Breath sounds: Normal breath sounds. No wheezing or rales.  Musculoskeletal:     Cervical back: No rigidity or tenderness.     Right lower leg: No edema.     Left lower leg: No edema.  Skin:    General: Skin is warm and dry.  Neurological:     Mental Status: He is alert and oriented to person, place, and time.  Psychiatric:        Mood and Affect: Mood normal.        Behavior: Behavior normal.      No results found for any visits on 06/20/22.    The ASCVD Risk score (Arnett DK, et al., 2019) failed to calculate for the following reasons:   The valid total cholesterol range is 130 to 320 mg/dL    Assessment & Plan:   Essential hypertension -     Basic metabolic panel -     CBC  Obesity (BMI 35.0-39.9 without comorbidity)  Stage 3a chronic kidney disease (HCC) -     Basic metabolic panel  Hip pain, bilateral -     Ambulatory referral to Orthopedic Surgery  Other fatigue -     Basic metabolic panel -     CBC -     TSH    Return in about 6 months (around 12/19/2022), or Return fasting for physical in 6 months..  Try to increase exercise as tolerated.  Hip pain has worsened.  Degenerative changes were seen in the x-rays taken a few years ago.  Referral to orthopedics.  Continue weight loss efforts and work with nutritionist.  [name], MD

## 2022-06-20 NOTE — Progress Notes (Signed)
Justin Boyd    [id number]    1951-05-05  Primary Care Physician:Kremer, Mortimer Fries, MD  Referring Physician: [name], MD [place],  [place] [address]  Chief complaint:   History of COPD Stable since last visit  HPI:   Last follow-up was over 2 years ago  Still continues to use Trelegy 100 Trelegy did help when he started using it He does get short of breath with exertion  Usually tries to stay active and exercises regularly  Reformed smoker Denies any chest pains or chest discomfort  No significant cough or chest pain or chest discomfort  He is part of the lung cancer screening program where he had a CT-CT was reviewed with patient showing evidence of emphysema  History of hypercholesterolemia  Exercise tolerance is not really significantly limited He states he can walk a couple of miles if he chooses to, was able to go further prior to recent shortness of breath  Office work  Outpatient Encounter Medications as of 06/20/2022  Medication Sig   albuterol (VENTOLIN HFA) 108 (90 Base) MCG/ACT inhaler Inhale 2 puffs into the lungs every 4 (four) hours as needed for wheezing or shortness of breath.   amLODipine (NORVASC) 5 MG tablet TAKE 1 TABLET(5 MG) BY MOUTH DAILY   Fluticasone-Umeclidin-Vilant (TRELEGY ELLIPTA) 200-62.5-25 MCG/ACT AEPB Inhale 1 puff into the lungs daily.   gabapentin (NEURONTIN) 400 MG capsule TAKE 1 CAPSULE(400 MG) BY MOUTH THREE TIMES DAILY   metoprolol succinate (TOPROL-XL) 50 MG 24 hr tablet TAKE 1 TABLET(50 MG) BY MOUTH DAILY WITH OR IMMEDIATELY FOLLOWING A MEAL   nitroGLYCERIN (NITROSTAT) 0.4 MG SL tablet Place 0.4 mg under the tongue every 5 (five) minutes as needed for chest pain.   pantoprazole (PROTONIX) 40 MG tablet TAKE 1 TABLET(40 MG) BY MOUTH DAILY   rosuvastatin (CRESTOR) 40 MG tablet Take 1 tablet (40 mg total) by mouth daily.   TRELEGY ELLIPTA 100-62.5-25 MCG/ACT AEPB INHALE 1 PUFF  INTO THE LUNGS DAILY   No facility-administered encounter medications on file as of 06/20/2022.    Allergies as of 06/20/2022 - Review Complete 06/20/2022  Allergen Reaction Noted   Oxycodone-acetaminophen Other (See Comments) 12/16/2010   Hydrocodone-acetaminophen Itching 12/16/2010   Penicillins Swelling 12/16/2010    Past Medical History:  Diagnosis Date   Abnormal CXR 03/30/2018   Anxiety disorder 04/12/2018   Atherosclerosis of coronary artery 05/25/2018   Atherosclerosis of native coronary artery of native heart without angina pectoris 12/30/2018   Atypical squamoproliferative skin lesion 09/01/2019   Barrett's esophagus without dysplasia 06/18/2020   Formatting of this note might be different from the original. last EGD 2 yr ago, per [facility]   Centrilobular emphysema ([place]) 09/06/2018   Chicken pox    CKD (chronic kidney disease), stage III ([place]) 12/23/2018   COPD with acute exacerbation ([place]) 02/14/2019   PFT's 06/2018 FVC (L) 2.65 3.64 72 2.84 78 +7 FEV1 (L) 1.35 2.71 49 1.62 60 +20 FEV1/FVC (%) 51 75 68 57 76 +11 FEV6 (L) 2.59 3.43 75 2.76 80 +6 FEV1/FEV6 (%) 52 78 66 59 75 +12 FEF 25-75% (L/sec) 0.59 2.19 26 0.85 39 +44 FEF Max (L/sec) 5.51 7.53 73 5.26 69 -4 FIVC (L) 2.36 2.72 +15 FIF Max (L/sec) 3.60 3.30 -8 ---- LUNG VOLUMES ---- SVC (L) 3.01 3.64 82 IC (L) 2.26 2.83 79 RV ([place]) (L) 2.37 2.0   Diverticulosis 12/04/2011   Formatting of this note might  be different from the original. per colonoscopy report   Elevated BP without diagnosis of hypertension 03/30/2018   Elevated glucose 09/01/2019   Elevated LDL cholesterol level 04/12/2018   Essential hypertension 10/19/2018   Exercise hypoxemia 02/14/2019   Gastroesophageal reflux disease 10/19/2018   Healthcare maintenance 09/01/2019   Hip pain, bilateral 08/13/2020   History of colonic polyps 06/06/2020   Hyperlipidemia    Need for Tdap vaccination 03/30/2018   Obesity (BMI 35.0-39.9 without comorbidity) 06/19/2020   Pain in  both lower extremities 08/13/2020   Restless leg syndrome 07/30/2018   Sleep walking 10/19/2018   SOB (shortness of breath) on exertion 03/30/2018   Sprain of right wrist [id number]   Umbilical hernia without obstruction and without gangrene 06/11/2021   Formatting of this note might be different from the original. Added automatically from request for surgery [id number]   Vitamin D deficiency 04/12/2018   Wrist pain, acute, right 12/23/2018    Past Surgical History:  Procedure Laterality Date   carpal tunnel both hands  2010   SHOULDER SURGERY Right 2016    Family History  Problem Relation Age of Onset   Hypertension Mother    Stroke Mother     Social History   Socioeconomic History   Marital status: Married    Spouse name: Not on file   Number of children: Not on file   Years of education: Not on file   Highest education level: Not on file  Occupational History   Not on file  Tobacco Use   Smoking status: Former    Packs/day: 1.00    Years: 45.00    Total pack years: 45.00    Types: Cigarettes    Quit date: 05/05/2018    Years since quitting: 4.1   Smokeless tobacco: Never  Vaping Use   Vaping Use: Never used  Substance and Sexual Activity   Alcohol use: Yes    Comment: 2 cans of beer a week   Drug use: Never   Sexual activity: Yes    Partners: Female  Other Topics Concern   Not on file  Social History Narrative   Not on file   Social Determinants of Health   Financial Resource Strain: Not on file  Food Insecurity: Not on file  Transportation Needs: No Transportation Needs (05/26/2022)   PRAPARE - Transportation    Lack of Transportation (Medical): No    Lack of Transportation (Non-Medical): No  Physical Activity: Not on file  Stress: Not on file  Social Connections: Not on file  Intimate Partner Violence: Not on file    Review of Systems  Constitutional: Negative.   HENT: Negative.    Eyes: Negative.   Respiratory:  Positive for shortness of breath.  Negative for cough.   Cardiovascular:  Negative for chest pain, palpitations and leg swelling.  Gastrointestinal: Negative.   All other systems reviewed and are negative.   Vitals:   06/20/22 1322  BP: 120/74  Pulse: (!) 58  SpO2: 93%     Physical Exam Constitutional:      Appearance: He is well-developed. He is obese.  HENT:     Head: Normocephalic and atraumatic.  Eyes:     General:        Right eye: No discharge.        Left eye: No discharge.  Neck:     Thyroid: No thyromegaly.     Vascular: No JVD.     Trachea: No tracheal deviation.  Cardiovascular:  Rate and Rhythm: Normal rate and regular rhythm.     Heart sounds: Normal heart sounds. No murmur heard. Pulmonary:     Effort: Pulmonary effort is normal. No respiratory distress.     Breath sounds: Normal breath sounds. No wheezing or rales.  Chest:     Chest wall: No tenderness.  Musculoskeletal:     Cervical back: No rigidity or tenderness.  Neurological:     Mental Status: He is alert.  Psychiatric:        Mood and Affect: Mood normal.    Data Reviewed: CT scan of the chest reviewed with the patient showing evidence of emphysema and small lung nodules   Pulmonary function study reviewed-severe obstructive disease with significant bronchodilator response  Assessment:  .  Chronic obstructive pulmonary disease -Emphysema -PFT consistent with severe obstructive disease  Shortness of breath with activity  Obesity  Hypertension GERD     Plan/Recommendations:  Continue Trelegy  Increase Trelegy to Trelegy 200  Will obtain pulmonary function test at next visit  Graded activities as tolerated  Follow-up in about 6 months  Call with significant concerns   Sherrilyn Rist MD [facility] 06/20/2022, 5:35 PM  CC: [name],*

## 2022-06-20 NOTE — Patient Instructions (Addendum)
We can increase Trelegy to 200  Continue albuterol as needed  I will see you in about 6 months  We can get a breathing study at your next visit to compare with the one you had in 2020, best way to assess if there is any progression of lung disease

## 2022-07-01 ENCOUNTER — Ambulatory Visit: Payer: BC Managed Care – PPO | Attending: Cardiology | Admitting: Cardiology

## 2022-07-01 ENCOUNTER — Encounter: Payer: Self-pay | Admitting: Cardiology

## 2022-07-01 VITALS — BP 120/68 | HR 59 | Ht 64.0 in | Wt 215.0 lb

## 2022-07-01 DIAGNOSIS — E669 Obesity, unspecified: Secondary | ICD-10-CM

## 2022-07-01 DIAGNOSIS — I2510 Atherosclerotic heart disease of native coronary artery without angina pectoris: Secondary | ICD-10-CM | POA: Diagnosis not present

## 2022-07-01 DIAGNOSIS — E785 Hyperlipidemia, unspecified: Secondary | ICD-10-CM

## 2022-07-01 DIAGNOSIS — Z713 Dietary counseling and surveillance: Secondary | ICD-10-CM | POA: Diagnosis not present

## 2022-07-01 DIAGNOSIS — I10 Essential (primary) hypertension: Secondary | ICD-10-CM

## 2022-07-01 NOTE — Progress Notes (Signed)
Cardiology Office Note:    Date:  07/01/2022   ID:  Justin Boyd, DOB 03/31/1951, MRN [id number]  PCP:  [name], MD  Cardiologist:  Jenean Lindau, MD   Referring MD: [name],*    ASSESSMENT:    1. Atherosclerosis of native coronary artery of native heart without angina pectoris   2. Essential hypertension   3. Obesity (BMI 35.0-39.9 without comorbidity)   4. Dyslipidemia (high LDL; low HDL)    PLAN:    In order of problems listed above:  Coronary atherosclerosis: Secondary prevention stressed with the patient.  Importance of compliance with diet medication stressed and he vocalized understanding.  I congratulated him about his excellent exercise practices. Essential hypertension: Blood pressure is stable and diet was emphasized. Mixed dyslipidemia: On lipid-lowering medications and followed by primary care.  Lipids reviewed and discussed with him. COPD: Ex-smoker: Followed by primary care and pulmonologist. Obesity: Weight reduction stressed.  Diet emphasized.  He is seeing a dietitian and is being very cautious about lifestyle modification and doing well.  I congratulated him about this. Patient will be seen in follow-up appointment in 9 months or earlier if the patient has any concerns    Medication Adjustments/Labs and Tests Ordered: Current medicines are reviewed at length with the patient today.  Concerns regarding medicines are outlined above.  Orders Placed This Encounter  Procedures   EKG 12-Lead   No orders of the defined types were placed in this encounter.    No chief complaint on file.    History of Present Illness:    Justin Boyd is a 72 y.o. male.  Patient has past medical history of coronary artery calcification, essential hypertension, dyslipidemia and COPD.  He denies any problems at this time and takes care of activities of daily living.  No chest pain orthopnea or PND.  He mentions to me that he walks at least half  an hour on a daily basis.  At the time of my evaluation, the patient is alert awake oriented and in no distress.  Past Medical History:  Diagnosis Date   Abnormal CXR 03/30/2018   Anxiety disorder 04/12/2018   Atherosclerosis of coronary artery 05/25/2018   Atherosclerosis of native coronary artery of native heart without angina pectoris 12/30/2018   Atypical squamoproliferative skin lesion 09/01/2019   Barrett's esophagus without dysplasia 06/18/2020   Formatting of this note might be different from the original. last EGD 2 yr ago, per [facility]   Centrilobular emphysema ([place]) 09/06/2018   Chicken pox    CKD (chronic kidney disease), stage III ([place]) 12/23/2018   COPD with acute exacerbation ([place]) 02/14/2019   PFT's 06/2018 FVC (L) 2.65 3.64 72 2.84 78 +7 FEV1 (L) 1.35 2.71 49 1.62 60 +20 FEV1/FVC (%) 51 75 68 57 76 +11 FEV6 (L) 2.59 3.43 75 2.76 80 +6 FEV1/FEV6 (%) 52 78 66 59 75 +12 FEF 25-75% (L/sec) 0.59 2.19 26 0.85 39 +44 FEF Max (L/sec) 5.51 7.53 73 5.26 69 -4 FIVC (L) 2.36 2.72 +15 FIF Max (L/sec) 3.60 3.30 -8 ---- LUNG VOLUMES ---- SVC (L) 3.01 3.64 82 IC (L) 2.26 2.83 79 RV ([place]th) (L) 2.37 2.0   Diverticulosis 12/04/2011   Formatting of this note might be different from the original. per colonoscopy report   Elevated BP without diagnosis of hypertension 03/30/2018   Elevated glucose 09/01/2019   Elevated LDL cholesterol level 04/12/2018   Essential hypertension 10/19/2018   Exercise hypoxemia 02/14/2019   Gastroesophageal  reflux disease 10/19/2018   Healthcare maintenance 09/01/2019   Hip pain, bilateral 08/13/2020   History of colonic polyps 06/06/2020   Hyperlipidemia    Need for Tdap vaccination 03/30/2018   Obesity (BMI 35.0-39.9 without comorbidity) 06/19/2020   Other fatigue 06/20/2022   Pain in both lower extremities 08/13/2020   Restless leg syndrome 07/30/2018   Sleep walking 10/19/2018   SOB (shortness of breath) on exertion 03/30/2018   Sprain of right  wrist 12/30/2018   Stage 3a chronic kidney disease ([place]) [id number]   Umbilical hernia without obstruction and without gangrene 06/11/2021   Formatting of this note might be different from the original. Added automatically from request for surgery [id number]   Wrist pain, acute, right 12/23/2018    Past Surgical History:  Procedure Laterality Date   carpal tunnel both hands  2010   SHOULDER SURGERY Right 2016    Current Medications: Current Meds  Medication Sig   albuterol (VENTOLIN HFA) 108 (90 Base) MCG/ACT inhaler Inhale 2 puffs into the lungs every 4 (four) hours as needed for wheezing or shortness of breath.   amLODipine (NORVASC) 5 MG tablet TAKE 1 TABLET(5 MG) BY MOUTH DAILY   Fluticasone-Umeclidin-Vilant (TRELEGY ELLIPTA) 200-62.5-25 MCG/ACT AEPB Inhale 1 puff into the lungs daily.   gabapentin (NEURONTIN) 400 MG capsule TAKE 1 CAPSULE(400 MG) BY MOUTH THREE TIMES DAILY   metoprolol succinate (TOPROL-XL) 50 MG 24 hr tablet TAKE 1 TABLET(50 MG) BY MOUTH DAILY WITH OR IMMEDIATELY FOLLOWING A MEAL   nitroGLYCERIN (NITROSTAT) 0.4 MG SL tablet Place 0.4 mg under the tongue every 5 (five) minutes as needed for chest pain.   pantoprazole (PROTONIX) 40 MG tablet TAKE 1 TABLET(40 MG) BY MOUTH DAILY   rosuvastatin (CRESTOR) 40 MG tablet Take 1 tablet (40 mg total) by mouth daily.   TRELEGY ELLIPTA 100-62.5-25 MCG/ACT AEPB INHALE 1 PUFF INTO THE LUNGS DAILY     Allergies:   Oxycodone-acetaminophen, Hydrocodone-acetaminophen, and Penicillins   Social History   Socioeconomic History   Marital status: Married    Spouse name: Not on file   Number of children: Not on file   Years of education: Not on file   Highest education level: Not on file  Occupational History   Not on file  Tobacco Use   Smoking status: Former    Packs/day: 1.00    Years: 45.00    Total pack years: 45.00    Types: Cigarettes    Quit date: 05/05/2018    Years since quitting: 4.1   Smokeless tobacco: Never   Vaping Use   Vaping Use: Never used  Substance and Sexual Activity   Alcohol use: Yes    Comment: 2 cans of beer a week   Drug use: Never   Sexual activity: Yes    Partners: Female  Other Topics Concern   Not on file  Social History Narrative   Not on file   Social Determinants of Health   Financial Resource Strain: Not on file  Food Insecurity: Not on file  Transportation Needs: No Transportation Needs (05/26/2022)   PRAPARE - Transportation    Lack of Transportation (Medical): No    Lack of Transportation (Non-Medical): No  Physical Activity: Not on file  Stress: Not on file  Social Connections: Not on file     Family History: The patient's family history includes Hypertension in his mother; Stroke in his mother.  ROS:   Please see the history of present illness.    All other systems reviewed  and are negative.  EKGs/Labs/Other Studies Reviewed:    The following studies were reviewed today: EKG reveals sinus bradycardia nonspecific ST-T changes   Recent Labs: 06/20/2022: BUN 22; Creatinine, Ser 1.08; Hemoglobin 16.9; Platelets 154.0; Potassium 4.4; Sodium 140; TSH 1.18  Recent Lipid Panel    Component Value Date/Time   CHOL 117 12/18/2021 0838   TRIG 166.0 (H) 12/18/2021 0838   HDL 32.70 (L) 12/18/2021 0838   CHOLHDL 4 12/18/2021 0838   VLDL 33.2 12/18/2021 0838   LDLCALC 51 12/18/2021 0838   LDLDIRECT 73.0 08/14/2021 1019    Physical Exam:    VS:  BP 120/68   Pulse (!) 59   Ht '5\' 4"'$  (1.626 m)   Wt 215 lb 0.6 oz (97.5 kg)   SpO2 94%   BMI 36.91 kg/m     Wt Readings from Last 3 Encounters:  07/01/22 215 lb 0.6 oz (97.5 kg)  06/20/22 215 lb (97.5 kg)  06/20/22 214 lb 6.4 oz (97.3 kg)     GEN: Patient is in no acute distress HEENT: Normal NECK: No JVD; No carotid bruits LYMPHATICS: No lymphadenopathy CARDIAC: Hear sounds regular, 2/6 systolic murmur at the apex. RESPIRATORY:  Clear to auscultation without rales, wheezing or rhonchi  ABDOMEN:  Soft, non-tender, non-distended MUSCULOSKELETAL:  No edema; No deformity  SKIN: Warm and dry NEUROLOGIC:  Alert and oriented x 3 PSYCHIATRIC:  Normal affect   Signed, Jenean Lindau, MD  07/01/2022 4:06 PM    [facility]

## 2022-07-01 NOTE — Patient Instructions (Signed)

## 2022-07-23 DIAGNOSIS — M160 Bilateral primary osteoarthritis of hip: Secondary | ICD-10-CM | POA: Diagnosis not present

## 2022-07-29 DIAGNOSIS — Z713 Dietary counseling and surveillance: Secondary | ICD-10-CM | POA: Diagnosis not present

## 2022-08-10 ENCOUNTER — Other Ambulatory Visit: Payer: Self-pay | Admitting: Family Medicine

## 2022-08-10 DIAGNOSIS — K219 Gastro-esophageal reflux disease without esophagitis: Secondary | ICD-10-CM

## 2022-08-18 ENCOUNTER — Encounter: Payer: Self-pay | Admitting: *Deleted

## 2022-08-26 ENCOUNTER — Other Ambulatory Visit: Payer: Self-pay | Admitting: Family Medicine

## 2022-08-26 DIAGNOSIS — Z713 Dietary counseling and surveillance: Secondary | ICD-10-CM | POA: Diagnosis not present

## 2022-08-26 DIAGNOSIS — G2581 Restless legs syndrome: Secondary | ICD-10-CM

## 2022-09-12 ENCOUNTER — Other Ambulatory Visit: Payer: Self-pay | Admitting: Family Medicine

## 2022-09-12 DIAGNOSIS — R0982 Postnasal drip: Secondary | ICD-10-CM

## 2022-09-18 DIAGNOSIS — H353131 Nonexudative age-related macular degeneration, bilateral, early dry stage: Secondary | ICD-10-CM | POA: Diagnosis not present

## 2022-10-19 ENCOUNTER — Other Ambulatory Visit: Payer: Self-pay | Admitting: Family Medicine

## 2022-10-19 DIAGNOSIS — E7800 Pure hypercholesterolemia, unspecified: Secondary | ICD-10-CM

## 2022-10-19 DIAGNOSIS — I2510 Atherosclerotic heart disease of native coronary artery without angina pectoris: Secondary | ICD-10-CM

## 2022-11-14 ENCOUNTER — Other Ambulatory Visit: Payer: Self-pay | Admitting: Family Medicine

## 2022-11-14 DIAGNOSIS — I10 Essential (primary) hypertension: Secondary | ICD-10-CM

## 2022-11-14 DIAGNOSIS — I2510 Atherosclerotic heart disease of native coronary artery without angina pectoris: Secondary | ICD-10-CM

## 2022-12-03 ENCOUNTER — Encounter (INDEPENDENT_AMBULATORY_CARE_PROVIDER_SITE_OTHER): Payer: Self-pay

## 2022-12-14 ENCOUNTER — Other Ambulatory Visit: Payer: Self-pay | Admitting: Pulmonary Disease

## 2022-12-17 ENCOUNTER — Telehealth: Payer: Self-pay | Admitting: Pulmonary Disease

## 2022-12-17 NOTE — Telephone Encounter (Signed)
Patient states needs 3 month supply of Trelegy 200. Needs 3 month supply due to insurance. Patient phone number is [phone].

## 2022-12-18 ENCOUNTER — Telehealth: Payer: Self-pay | Admitting: Family Medicine

## 2022-12-18 NOTE — Telephone Encounter (Signed)
Pt has appt tomorrow 8/16 and thinks he is going to be asked for blood and urine samples and would like a call back to discuss this.

## 2022-12-19 ENCOUNTER — Encounter: Payer: Self-pay | Admitting: Family Medicine

## 2022-12-19 ENCOUNTER — Ambulatory Visit: Payer: BC Managed Care – PPO | Admitting: Family Medicine

## 2022-12-19 VITALS — BP 110/62 | HR 59 | Temp 98.0°F | Ht 64.0 in | Wt 216.2 lb

## 2022-12-19 DIAGNOSIS — Z0000 Encounter for general adult medical examination without abnormal findings: Secondary | ICD-10-CM | POA: Diagnosis not present

## 2022-12-19 DIAGNOSIS — I10 Essential (primary) hypertension: Secondary | ICD-10-CM

## 2022-12-19 DIAGNOSIS — R7303 Prediabetes: Secondary | ICD-10-CM | POA: Diagnosis not present

## 2022-12-19 DIAGNOSIS — E7800 Pure hypercholesterolemia, unspecified: Secondary | ICD-10-CM | POA: Diagnosis not present

## 2022-12-19 DIAGNOSIS — I2510 Atherosclerotic heart disease of native coronary artery without angina pectoris: Secondary | ICD-10-CM | POA: Diagnosis not present

## 2022-12-19 DIAGNOSIS — E669 Obesity, unspecified: Secondary | ICD-10-CM

## 2022-12-19 DIAGNOSIS — Z125 Encounter for screening for malignant neoplasm of prostate: Secondary | ICD-10-CM

## 2022-12-19 DIAGNOSIS — E559 Vitamin D deficiency, unspecified: Secondary | ICD-10-CM

## 2022-12-19 DIAGNOSIS — J432 Centrilobular emphysema: Secondary | ICD-10-CM

## 2022-12-19 DIAGNOSIS — N1831 Chronic kidney disease, stage 3a: Secondary | ICD-10-CM | POA: Diagnosis not present

## 2022-12-19 LAB — URINALYSIS, ROUTINE W REFLEX MICROSCOPIC
Bilirubin Urine: NEGATIVE
Hgb urine dipstick: NEGATIVE
Ketones, ur: NEGATIVE
Leukocytes,Ua: NEGATIVE
Nitrite: NEGATIVE
Specific Gravity, Urine: 1.025 (ref 1.000–1.030)
Total Protein, Urine: NEGATIVE
Urine Glucose: NEGATIVE
Urobilinogen, UA: 0.2 (ref 0.0–1.0)
WBC, UA: NONE SEEN (ref 0–?)
pH: 6 (ref 5.0–8.0)

## 2022-12-19 LAB — LIPID PANEL
Cholesterol: 133 mg/dL (ref 0–200)
HDL: 34.1 mg/dL — ABNORMAL LOW (ref >39.00)
LDL Cholesterol: 59 mg/dL (ref 0–99)
NonHDL: 98.5
Total CHOL/HDL Ratio: 4
Triglycerides: 198 mg/dL — ABNORMAL HIGH (ref 0.0–149.0)
VLDL: 39.6 mg/dL (ref 0.0–40.0)

## 2022-12-19 LAB — CBC
HCT: 49.9 % (ref 39.0–52.0)
Hemoglobin: 16.3 g/dL (ref 13.0–17.0)
MCHC: 32.6 g/dL (ref 30.0–36.0)
MCV: 89 fl (ref 78.0–100.0)
Platelets: 151 10*3/uL (ref 150.0–400.0)
RBC: 5.61 Mil/uL (ref 4.22–5.81)
RDW: 15 % (ref 11.5–15.5)
WBC: 6.6 10*3/uL (ref 4.0–10.5)

## 2022-12-19 LAB — PSA: PSA: 0.41 ng/mL (ref 0.10–4.00)

## 2022-12-19 LAB — COMPREHENSIVE METABOLIC PANEL
ALT: 32 U/L (ref 0–53)
AST: 21 U/L (ref 0–37)
Albumin: 4.3 g/dL (ref 3.5–5.2)
Alkaline Phosphatase: 65 U/L (ref 39–117)
BUN: 20 mg/dL (ref 6–23)
CO2: 22 meq/L (ref 19–32)
Calcium: 9.4 mg/dL (ref 8.4–10.5)
Chloride: 101 meq/L (ref 96–112)
Creatinine, Ser: 1.01 mg/dL (ref 0.40–1.50)
GFR: 75.55 mL/min (ref >60.00)
Glucose, Bld: 109 mg/dL — ABNORMAL HIGH (ref 70–99)
Potassium: 4.3 meq/L (ref 3.5–5.1)
Sodium: 134 mEq/L — ABNORMAL LOW (ref 135–145)
Total Bilirubin: 0.7 mg/dL (ref 0.2–1.2)
Total Protein: 6.8 g/dL (ref 6.0–8.3)

## 2022-12-19 LAB — VITAMIN D 25 HYDROXY (VIT D DEFICIENCY, FRACTURES): VITD: 30.34 ng/mL (ref 30.00–100.00)

## 2022-12-19 MED ORDER — TIRZEPATIDE 2.5 MG/0.5ML ~~LOC~~ SOAJ: 2.5000 mg | SUBCUTANEOUS | 1 refills | Status: DC

## 2022-12-19 NOTE — Progress Notes (Signed)
Established Patient Office Visit   Subjective:  Patient ID: Justin Boyd, male    DOB: 1950/09/07  Age: 72 y.o. MRN: [id number]  Chief Complaint  Patient presents with   Medical Management of Chronic Issues    6 month follow up. Pt complains of SOB x 2 weeks.     HPI Encounter Diagnoses  Name Primary?   Healthcare maintenance Yes   Essential hypertension    Obesity (BMI 35.0-39.9 without comorbidity)    Stage 3a chronic kidney disease (HCC)    Elevated LDL cholesterol level    Screening for prostate cancer    Vitamin D deficiency    Prediabetes    Atherosclerosis of native coronary artery of native heart without angina pectoris    Centrilobular emphysema (HCC)    For visit: Follow-up of above.  Ongoing issue with shortness of breath.  Fortunately not associated with chest pain nausea diaphoresis.  Longstanding history of COPD.  He quit smoking 5 years ago.  Negative cardiac workup.  Ongoing follow-up with pulmonology.  Continues on medications as below.  He is interested in trying tirzepatide.  Ongoing dental care.  Distant history of pancreatitis associated with high triglycerides.  He has an occasional beer and has never been a heavy drinker.   Review of Systems  Constitutional: Negative.   HENT: Negative.    Eyes:  Negative for blurred vision, discharge and redness.  Respiratory:  Positive for shortness of breath. Negative for hemoptysis, sputum production and wheezing.   Cardiovascular: Negative.  Negative for chest pain and palpitations.  Gastrointestinal:  Negative for abdominal pain.  Genitourinary: Negative.   Musculoskeletal: Negative.  Negative for myalgias.  Skin:  Negative for rash.  Neurological:  Negative for tingling, loss of consciousness and weakness.  Endo/Heme/Allergies:  Negative for polydipsia.     Current Outpatient Medications:    albuterol (VENTOLIN HFA) 108 (90 Base) MCG/ACT inhaler, Inhale 2 puffs into the lungs every 4 (four) hours as needed  for wheezing or shortness of breath., Disp: 1 each, Rfl: 5   amLODipine (NORVASC) 5 MG tablet, TAKE 1 TABLET(5 MG) BY MOUTH DAILY, Disp: 90 tablet, Rfl: 4   gabapentin (NEURONTIN) 400 MG capsule, TAKE 1 CAPSULE(400 MG) BY MOUTH THREE TIMES DAILY, Disp: 270 capsule, Rfl: 3   metoprolol succinate (TOPROL-XL) 50 MG 24 hr tablet, TAKE 1 TABLET(50 MG) BY MOUTH DAILY WITH OR IMMEDIATELY FOLLOWING A MEAL, Disp: 90 tablet, Rfl: 4   nitroGLYCERIN (NITROSTAT) 0.4 MG SL tablet, Place 0.4 mg under the tongue every 5 (five) minutes as needed for chest pain., Disp: , Rfl:    pantoprazole (PROTONIX) 40 MG tablet, TAKE 1 TABLET(40 MG) BY MOUTH DAILY, Disp: 90 tablet, Rfl: 3   rosuvastatin (CRESTOR) 40 MG tablet, TAKE 1 TABLET(40 MG) BY MOUTH DAILY, Disp: 90 tablet, Rfl: 3   TRELEGY ELLIPTA 200-62.5-25 MCG/ACT AEPB, INHALE 1 PUFF INTO THE LUNGS DAILY, Disp: 60 each, Rfl: 5   tirzepatide (MOUNJARO) 2.5 MG/0.5ML Pen, Inject 2.5 mg into the skin once a week., Disp: 6 mL, Rfl: 1   TRELEGY ELLIPTA 100-62.5-25 MCG/ACT AEPB, INHALE 1 PUFF INTO THE LUNGS DAILY, Disp: 180 each, Rfl: 3   Objective:     BP 110/62   Pulse (!) 59   Temp 98 F (36.7 C)   Ht 5\' 4"  (1.626 m)   Wt 216 lb 3.2 oz (98.1 kg)   SpO2 93%   BMI 37.11 kg/m  BP Readings from Last 3 Encounters:  12/19/22 110/62  07/01/22  120/68  06/20/22 120/74   Wt Readings from Last 3 Encounters:  12/19/22 216 lb 3.2 oz (98.1 kg)  07/01/22 215 lb 0.6 oz (97.5 kg)  06/20/22 215 lb (97.5 kg)      Physical Exam Constitutional:      General: He is not in acute distress.    Appearance: Normal appearance. He is not ill-appearing, toxic-appearing or diaphoretic.  HENT:     Head: Normocephalic and atraumatic.     Right Ear: Tympanic membrane, ear canal and external ear normal.     Left Ear: Tympanic membrane, ear canal and external ear normal.     Mouth/Throat:     Mouth: Mucous membranes are moist.     Pharynx: Oropharynx is clear. No oropharyngeal  exudate or posterior oropharyngeal erythema.  Eyes:     General: No scleral icterus.       Right eye: No discharge.        Left eye: No discharge.     Extraocular Movements: Extraocular movements intact.     Conjunctiva/sclera: Conjunctivae normal.     Pupils: Pupils are equal, round, and reactive to light.  Cardiovascular:     Rate and Rhythm: Normal rate and regular rhythm.  Pulmonary:     Effort: Pulmonary effort is normal. No respiratory distress.     Breath sounds: Normal breath sounds.  Abdominal:     General: Bowel sounds are normal.     Tenderness: There is no abdominal tenderness. There is no guarding.  Musculoskeletal:     Cervical back: No rigidity or tenderness.  Skin:    General: Skin is warm and dry.  Neurological:     Mental Status: He is alert and oriented to person, place, and time.  Psychiatric:        Mood and Affect: Mood normal.        Behavior: Behavior normal.      No results found for any visits on 12/19/22.    The ASCVD Risk score (Arnett DK, et al., 2019) failed to calculate for the following reasons:   The valid total cholesterol range is 130 to 320 mg/dL    Assessment & Plan:   Healthcare maintenance  Essential hypertension -     CBC -     Comprehensive metabolic panel -     Urinalysis, Routine w reflex microscopic  Obesity (BMI 35.0-39.9 without comorbidity) -     Tirzepatide; Inject 2.5 mg into the skin once a week.  Dispense: 6 mL; Refill: 1  Stage 3a chronic kidney disease (HCC) -     Comprehensive metabolic panel  Elevated LDL cholesterol level -     Comprehensive metabolic panel -     Lipid panel  Screening for prostate cancer -     PSA  Vitamin D deficiency -     VITAMIN D 25 Hydroxy (Vit-D Deficiency, Fractures)  Prediabetes -     Comprehensive metabolic panel -     Hemoglobin A1c -     Tirzepatide; Inject 2.5 mg into the skin once a week.  Dispense: 6 mL; Refill: 1  Atherosclerosis of native coronary artery of  native heart without angina pectoris -     Lipid panel  Centrilobular emphysema (HCC)    Return in about 3 months (around 03/21/2023).  Wrote for 2 tirzepatide.  Discussed risks and concerns including risk of thyroid C cancer.  Abdominal pain associated with pancreatitis and intestinal obstruction. upcoming follow-up with pulmonology for COPD.  Continue all medications as  above.  Mliss Sax, MD

## 2022-12-22 LAB — HEMOGLOBIN A1C: Hgb A1c MFr Bld: 6.1 % (ref 4.6–6.5)

## 2022-12-24 MED ORDER — TRELEGY ELLIPTA 200-62.5-25 MCG/ACT IN AEPB: 1.0000 | INHALATION_SPRAY | Freq: Every day | RESPIRATORY_TRACT | 0 refills | Status: DC

## 2022-12-24 NOTE — Telephone Encounter (Signed)
Updated script sent.

## 2023-01-30 ENCOUNTER — Encounter: Payer: Self-pay | Admitting: Pulmonary Disease

## 2023-01-30 ENCOUNTER — Ambulatory Visit: Payer: BC Managed Care – PPO | Admitting: Pulmonary Disease

## 2023-01-30 VITALS — BP 122/64 | HR 59 | Temp 97.1°F | Ht 64.0 in | Wt 218.6 lb

## 2023-01-30 DIAGNOSIS — J432 Centrilobular emphysema: Secondary | ICD-10-CM

## 2023-01-30 DIAGNOSIS — R0602 Shortness of breath: Secondary | ICD-10-CM

## 2023-01-30 MED ORDER — TRELEGY ELLIPTA 200-62.5-25 MCG/ACT IN AEPB: 1.0000 | INHALATION_SPRAY | Freq: Every day | RESPIRATORY_TRACT | 3 refills | Status: DC

## 2023-01-30 NOTE — Progress Notes (Signed)
Justin Boyd    [id number]    Feb 26, 1951  Primary Care Physician:Kremer, Talmadge Coventry, MD  Referring Physician: Mliss Sax, MD [address] [place],  [place] [address]  Chief complaint:   History of COPD Stable since last visit  HPI:   Last follow-up was over 2 years ago  Still continues to use Trelegy 100 Benefiting from Trelegy Has been trying to stay active  Denies any ongoing symptoms of present  Reformed smoker  No significant cough or chest pain or chest discomfort  He is part of the lung cancer screening program where he had a CT-CT was reviewed with patient showing evidence of emphysema  History of hypercholesterolemia  Exercise tolerance is not really significantly limited He states he can walk a couple of miles if he chooses to, was able to go further prior to recent shortness of breath  Office work  Outpatient Encounter Medications as of 01/30/2023  Medication Sig   albuterol (VENTOLIN HFA) 108 (90 Base) MCG/ACT inhaler Inhale 2 puffs into the lungs every 4 (four) hours as needed for wheezing or shortness of breath.   amLODipine (NORVASC) 5 MG tablet TAKE 1 TABLET(5 MG) BY MOUTH DAILY   gabapentin (NEURONTIN) 400 MG capsule TAKE 1 CAPSULE(400 MG) BY MOUTH THREE TIMES DAILY   metoprolol succinate (TOPROL-XL) 50 MG 24 hr tablet TAKE 1 TABLET(50 MG) BY MOUTH DAILY WITH OR IMMEDIATELY FOLLOWING A MEAL   nitroGLYCERIN (NITROSTAT) 0.4 MG SL tablet Place 0.4 mg under the tongue every 5 (five) minutes as needed for chest pain.   pantoprazole (PROTONIX) 40 MG tablet TAKE 1 TABLET(40 MG) BY MOUTH DAILY   rosuvastatin (CRESTOR) 40 MG tablet TAKE 1 TABLET(40 MG) BY MOUTH DAILY   [DISCONTINUED] Fluticasone-Umeclidin-Vilant (TRELEGY ELLIPTA) 200-62.5-25 MCG/ACT AEPB Inhale 1 puff into the lungs daily.   Fluticasone-Umeclidin-Vilant (TRELEGY ELLIPTA) 200-62.5-25 MCG/ACT AEPB Inhale 1 puff into the lungs daily.   [DISCONTINUED] tirzepatide  [facility]) 2.5 MG/0.5ML Pen Inject 2.5 mg into the skin once a week. (Patient not taking: Reported on 01/30/2023)   No facility-administered encounter medications on file as of 01/30/2023.    Allergies as of 01/30/2023 - Review Complete 01/30/2023  Allergen Reaction Noted   Oxycodone-acetaminophen Other (See Comments) 12/16/2010   Hydrocodone-acetaminophen Itching 12/16/2010   Penicillins Swelling 12/16/2010    Past Medical History:  Diagnosis Date   Abnormal CXR 03/30/2018   Anxiety disorder 04/12/2018   Atherosclerosis of coronary artery 05/25/2018   Atherosclerosis of native coronary artery of native heart without angina pectoris 12/30/2018   Atypical squamoproliferative skin lesion 09/01/2019   Barrett's esophagus without dysplasia 06/18/2020   Formatting of this note might be different from the original. last EGD 2 yr ago, per [facility]   Centrilobular emphysema (HCC) 09/06/2018   Chicken pox    CKD (chronic kidney disease), stage III (HCC) 12/23/2018   COPD with acute exacerbation (HCC) 02/14/2019   PFT's 06/2018 FVC (L) 2.65 3.64 72 2.84 78 +7 FEV1 (L) 1.35 2.71 49 1.62 60 +20 FEV1/FVC (%) 51 75 68 57 76 +11 FEV6 (L) 2.59 3.43 75 2.76 80 +6 FEV1/FEV6 (%) 52 78 66 59 75 +12 FEF 25-75% (L/sec) 0.59 2.19 26 0.85 39 +44 FEF Max (L/sec) 5.51 7.53 73 5.26 69 -4 FIVC (L) 2.36 2.72 +15 FIF Max (L/sec) 3.60 3.30 -8 ---- LUNG VOLUMES ---- SVC (L) 3.01 3.64 82 IC (L) 2.26 2.83 79 RV (Pleth) (L) 2.37 2.0   Diverticulosis 12/04/2011  Formatting of this note might be different from the original. per colonoscopy report   Elevated BP without diagnosis of hypertension 03/30/2018   Elevated glucose 09/01/2019   Elevated LDL cholesterol level 04/12/2018   Essential hypertension 10/19/2018   Exercise hypoxemia 02/14/2019   Gastroesophageal reflux disease 10/19/2018   Healthcare maintenance 09/01/2019   Hip pain, bilateral 08/13/2020   History of colonic polyps 06/06/2020   Hyperlipidemia     Need for Tdap vaccination 03/30/2018   Obesity (BMI 35.0-39.9 without comorbidity) 06/19/2020   Other fatigue 06/20/2022   Pain in both lower extremities 08/13/2020   Restless leg syndrome 07/30/2018   Sleep walking 10/19/2018   SOB (shortness of breath) on exertion 03/30/2018   Sprain of right wrist 12/30/2018   Stage 3a chronic kidney disease (HCC) 12/23/2018   Umbilical hernia without obstruction and without gangrene 06/11/2021   Formatting of this note might be different from the original. Added automatically from request for surgery [id number]   Wrist pain, acute, right 12/23/2018    Past Surgical History:  Procedure Laterality Date   carpal tunnel both hands  2010   SHOULDER SURGERY Right 2016    Family History  Problem Relation Age of Onset   Hypertension Mother    Stroke Mother     Social History   Socioeconomic History   Marital status: Married    Spouse name: Not on file   Number of children: Not on file   Years of education: Not on file   Highest education level: Not on file  Occupational History   Not on file  Tobacco Use   Smoking status: Former    Current packs/day: 0.00    Average packs/day: 1 pack/day for 45.0 years (45.0 ttl pk-yrs)    Types: Cigarettes    Start date: 05/05/1973    Quit date: 05/05/2018    Years since quitting: 4.7   Smokeless tobacco: Never  Vaping Use   Vaping status: Never Used  Substance and Sexual Activity   Alcohol use: Yes    Comment: 2 cans of beer a week   Drug use: Never   Sexual activity: Yes    Partners: Female  Other Topics Concern   Not on file  Social History Narrative   Not on file   Social Determinants of Health   Financial Resource Strain: Not on file  Food Insecurity: Not on file  Transportation Needs: No Transportation Needs (05/26/2022)   PRAPARE - Transportation    Lack of Transportation (Medical): No    Lack of Transportation (Non-Medical): No  Physical Activity: Not on file  Stress: Not on file   Social Connections: Not on file  Intimate Partner Violence: Not on file    Review of Systems  Constitutional: Negative.   HENT: Negative.    Eyes: Negative.   Respiratory:  Positive for shortness of breath. Negative for cough.   Cardiovascular:  Negative for chest pain, palpitations and leg swelling.  Gastrointestinal: Negative.   All other systems reviewed and are negative.   Vitals:   01/30/23 0920  BP: 122/64  Pulse: (!) 59  Temp: (!) 97.1 F (36.2 C)  SpO2: 94%     Physical Exam Constitutional:      Appearance: He is well-developed. He is obese.  HENT:     Head: Normocephalic and atraumatic.  Eyes:     General:        Right eye: No discharge.        Left eye: No discharge.  Neck:     Thyroid: No thyromegaly.     Vascular: No JVD.     Trachea: No tracheal deviation.  Cardiovascular:     Rate and Rhythm: Normal rate and regular rhythm.     Heart sounds: Normal heart sounds. No murmur heard. Pulmonary:     Effort: Pulmonary effort is normal. No respiratory distress.     Breath sounds: Normal breath sounds. No wheezing or rales.  Chest:     Chest wall: No tenderness.  Musculoskeletal:     Cervical back: No rigidity or tenderness.  Neurological:     Mental Status: He is alert.  Psychiatric:        Mood and Affect: Mood normal.    Data Reviewed: CT scan of the chest reviewed with the patient showing evidence of emphysema and small lung nodules-stable  Pulmonary function study reviewed-severe obstructive disease with significant bronchodilator response  Assessment:  .  Chronic obstructive pulmonary disease -Emphysema -PFT consistent with severe obstructive disease  Shortness of breath with activity has been stable  History of hypertension, GERD, obesity   Plan/Recommendations:  Continue Trelegy  Graded activities as tolerated  Encouraged to call with significant concerns  Schedule for PFTs at next visit  Low-dose CT scan of the chest in  January    Virl Diamond MD [facility] 01/30/2023, 8:45 PM  CC: Mliss Sax,*

## 2023-01-30 NOTE — Patient Instructions (Signed)
Order for low-dose CT in January 2025  Trelegy refilled for 90-day supply  Schedule PFT to be done at next visit  Next visit in 6 months  Graded exercises as tolerated Exercising on a treadmill with gradual increase in incline may help with the challenges you are facing with walking up a grade  Make sure you continue your usual regular exercises

## 2023-02-18 ENCOUNTER — Encounter: Payer: Self-pay | Admitting: Family Medicine

## 2023-03-12 ENCOUNTER — Telehealth: Payer: Self-pay | Admitting: Pulmonary Disease

## 2023-03-12 DIAGNOSIS — Z87891 Personal history of nicotine dependence: Secondary | ICD-10-CM

## 2023-03-12 DIAGNOSIS — Z122 Encounter for screening for malignant neoplasm of respiratory organs: Secondary | ICD-10-CM

## 2023-03-12 NOTE — Telephone Encounter (Signed)
Spoke with pt and scheduled LDCT 05/25/23 9:30

## 2023-03-12 NOTE — Telephone Encounter (Signed)
Patient needs CT lung cancer screening appointment made for January 2025.

## 2023-03-18 DIAGNOSIS — H33311 Horseshoe tear of retina without detachment, right eye: Secondary | ICD-10-CM | POA: Diagnosis not present

## 2023-03-23 ENCOUNTER — Ambulatory Visit: Payer: BC Managed Care – PPO | Admitting: Family Medicine

## 2023-03-23 ENCOUNTER — Encounter: Payer: Self-pay | Admitting: Family Medicine

## 2023-03-23 VITALS — BP 110/68 | HR 62 | Temp 98.4°F | Ht 64.0 in | Wt 216.6 lb

## 2023-03-23 DIAGNOSIS — E66812 Obesity, class 2: Secondary | ICD-10-CM | POA: Insufficient documentation

## 2023-03-23 DIAGNOSIS — E6609 Other obesity due to excess calories: Secondary | ICD-10-CM

## 2023-03-23 DIAGNOSIS — E559 Vitamin D deficiency, unspecified: Secondary | ICD-10-CM

## 2023-03-23 DIAGNOSIS — R7303 Prediabetes: Secondary | ICD-10-CM

## 2023-03-23 DIAGNOSIS — Z6837 Body mass index (BMI) 37.0-37.9, adult: Secondary | ICD-10-CM

## 2023-03-23 HISTORY — DX: Obesity, class 2: E66.812

## 2023-03-23 MED ORDER — METFORMIN HCL ER 500 MG PO TB24
500.0000 mg | ORAL_TABLET | Freq: Every evening | ORAL | 1 refills | Status: DC
Start: 2023-03-23 — End: 2023-09-14

## 2023-03-23 NOTE — Progress Notes (Signed)
Established Patient Office Visit   Subjective:  Patient ID: Justin Boyd, male    DOB: [birth year]  Age: 72 y.o. MRN: [id number]  Chief Complaint  Patient presents with   Medical Management of Chronic Issues    3 month follow up. Pt complains of breathing issues during exertion. Walking short distance.    Shortness of Breath    Breathing issues with low impact. Pt had pain in lung 2 weeks ago.     HPI Encounter Diagnoses  Name Primary?   Class 2 obesity due to excess calories without serious comorbidity with body mass index (BMI) of 37.0 to 37.9 in adult Yes   Prediabetes    Vitamin D deficiency    For follow-up of above.  Ongoing issues with shortness of breath associated with severe COPD.  He understands that his weight is part of the problem but has had difficulty losing weight.  It is difficult for him to exercise is much as he would like with a shortness of breath.  Tirzepatide was not covered by his insurance.  He is taking the 5000 units of vitamin D daily.   Review of Systems  Constitutional: Negative.   HENT: Negative.    Eyes:  Negative for blurred vision, discharge and redness.  Respiratory: Negative.    Cardiovascular: Negative.   Gastrointestinal:  Negative for abdominal pain.  Genitourinary: Negative.   Musculoskeletal: Negative.  Negative for myalgias.  Skin:  Negative for rash.  Neurological:  Negative for tingling, loss of consciousness and weakness.  Endo/Heme/Allergies:  Negative for polydipsia.     Current Outpatient Medications:    albuterol (VENTOLIN HFA) 108 (90 Base) MCG/ACT inhaler, Inhale 2 puffs into the lungs every 4 (four) hours as needed for wheezing or shortness of breath., Disp: 1 each, Rfl: 5   amLODipine (NORVASC) 5 MG tablet, TAKE 1 TABLET(5 MG) BY MOUTH DAILY, Disp: 90 tablet, Rfl: 4   Fluticasone-Umeclidin-Vilant (TRELEGY ELLIPTA) 200-62.5-25 MCG/ACT AEPB, Inhale 1 puff into the lungs daily., Disp: 180 each, Rfl: 3   gabapentin  (NEURONTIN) 400 MG capsule, TAKE 1 CAPSULE(400 MG) BY MOUTH THREE TIMES DAILY, Disp: 270 capsule, Rfl: 3   metFORMIN (GLUCOPHAGE-XR) 500 MG 24 hr tablet, Take 1 tablet (500 mg total) by mouth at bedtime., Disp: 90 tablet, Rfl: 1   metoprolol succinate (TOPROL-XL) 50 MG 24 hr tablet, TAKE 1 TABLET(50 MG) BY MOUTH DAILY WITH OR IMMEDIATELY FOLLOWING A MEAL, Disp: 90 tablet, Rfl: 4   nitroGLYCERIN (NITROSTAT) 0.4 MG SL tablet, Place 0.4 mg under the tongue every 5 (five) minutes as needed for chest pain., Disp: , Rfl:    pantoprazole (PROTONIX) 40 MG tablet, TAKE 1 TABLET(40 MG) BY MOUTH DAILY, Disp: 90 tablet, Rfl: 3   rosuvastatin (CRESTOR) 40 MG tablet, TAKE 1 TABLET(40 MG) BY MOUTH DAILY, Disp: 90 tablet, Rfl: 3   Objective:     BP 110/68   Pulse 62   Temp 98.4 F (36.9 C)   Ht 5\' 4"  (1.626 m)   Wt 216 lb 9.6 oz (98.2 kg)   SpO2 94%   BMI 37.18 kg/m  Wt Readings from Last 3 Encounters:  03/23/23 216 lb 9.6 oz (98.2 kg)  01/30/23 218 lb 9.6 oz (99.2 kg)  12/19/22 216 lb 3.2 oz (98.1 kg)      Physical Exam Constitutional:      General: He is not in acute distress.    Appearance: Normal appearance. He is not ill-appearing, toxic-appearing or diaphoretic.  HENT:  Head: Normocephalic and atraumatic.     Right Ear: External ear normal.     Left Ear: External ear normal.  Eyes:     General: No scleral icterus.       Right eye: No discharge.        Left eye: No discharge.     Extraocular Movements: Extraocular movements intact.     Conjunctiva/sclera: Conjunctivae normal.  Cardiovascular:     Rate and Rhythm: Normal rate and regular rhythm.  Pulmonary:     Effort: Pulmonary effort is normal. No respiratory distress.     Breath sounds: Normal breath sounds.  Skin:    General: Skin is warm and dry.  Neurological:     Mental Status: He is alert and oriented to person, place, and time.  Psychiatric:        Mood and Affect: Mood normal.        Behavior: Behavior normal.       No results found for any visits on 03/23/23.    The 10-year ASCVD risk score (Arnett DK, et al., 2019) is: 15.8%    Assessment & Plan:   Class 2 obesity due to excess calories without serious comorbidity with body mass index (BMI) of 37.0 to 37.9 in adult -     Amb Ref to Medical Weight Management  Prediabetes -     metFORMIN HCl ER; Take 1 tablet (500 mg total) by mouth at bedtime.  Dispense: 90 tablet; Refill: 1  Vitamin D deficiency    Return in about 3 months (around 06/23/2023), or if symptoms worsen or fail to improve.  Agrees to give medical weight loss management try continue vitamin D supplementation..  Will go ahead and start Glucophage for prediabetes.  Discussed side effects that often past for most people.  He will let me know if they do not.  Mliss Sax, MD

## 2023-04-05 HISTORY — PX: RETINAL DETACHMENT SURGERY: SHX105

## 2023-04-15 DIAGNOSIS — H33311 Horseshoe tear of retina without detachment, right eye: Secondary | ICD-10-CM | POA: Diagnosis not present

## 2023-04-22 ENCOUNTER — Ambulatory Visit (INDEPENDENT_AMBULATORY_CARE_PROVIDER_SITE_OTHER): Payer: BC Managed Care – PPO | Admitting: Adult Health

## 2023-04-22 ENCOUNTER — Encounter (INDEPENDENT_AMBULATORY_CARE_PROVIDER_SITE_OTHER): Payer: Self-pay | Admitting: Adult Health

## 2023-04-22 VITALS — BP 122/71 | HR 65 | Temp 97.6°F | Ht 63.5 in | Wt 215.0 lb

## 2023-04-22 DIAGNOSIS — R7303 Prediabetes: Secondary | ICD-10-CM | POA: Diagnosis not present

## 2023-04-22 DIAGNOSIS — Z0289 Encounter for other administrative examinations: Secondary | ICD-10-CM

## 2023-04-22 DIAGNOSIS — E669 Obesity, unspecified: Secondary | ICD-10-CM | POA: Diagnosis not present

## 2023-04-22 DIAGNOSIS — Z6837 Body mass index (BMI) 37.0-37.9, adult: Secondary | ICD-10-CM | POA: Diagnosis not present

## 2023-04-22 NOTE — Progress Notes (Signed)
Office: [phone]  /  Fax: [phone]   Initial Visit  Justin Boyd was seen in clinic today to evaluate for obesity. He is interested in losing weight to improve overall health and reduce the risk of weight related complications. He presents today to review program treatment options, initial physical assessment, and evaluation.     He was referred by: PCP  When asked what else they would like to accomplish? He states: Adopt healthier eating patterns, Improve energy levels and physical activity, Improve existing medical conditions, and Current weight 215 lbs, goal weigh 170 lbs  Weight history: He stopped tobacco use 05/05/2018- weight at that time 170lbs.  Current weight 215 lbs  When asked how has your weight affected you? He states: Contributed to medical problems, Contributed to orthopedic problems or mobility issues, Having fatigue, Having poor endurance, and Problems with eating patterns  Some associated conditions: Hypertension, Hyperlipidemia, and Prediabetes  Contributing factors: Family history of obesity, Disruption of circadian rhythm / sleep disordered breathing, Reduced physical activity, and Eating patterns  Weight promoting medications identified: Beta-blockers and Other: Gabapentin  Current nutrition plan: None  Current level of physical activity: None  Current or previous pharmacotherapy: Metformin  Response to medication:  He has lost a "little bit of weight" since starting Metformin a few months ago.   Past medical history includes:   Past Medical History:  Diagnosis Date   Abnormal CXR 03/30/2018   Anxiety disorder 04/12/2018   Atherosclerosis of coronary artery 05/25/2018   Atherosclerosis of native coronary artery of native heart without angina pectoris 12/30/2018   Atypical squamoproliferative skin lesion 09/01/2019   Barrett's esophagus without dysplasia 06/18/2020   Formatting of this note might be different from the original. last EGD 2 yr ago,  per [facility]   Centrilobular emphysema (HCC) 09/06/2018   Chicken pox    CKD (chronic kidney disease), stage III (HCC) 12/23/2018   COPD with acute exacerbation (HCC) 02/14/2019   PFT's 06/2018 FVC (L) 2.65 3.64 72 2.84 78 +7 FEV1 (L) 1.35 2.71 49 1.62 60 +20 FEV1/FVC (%) 51 75 68 57 76 +11 FEV6 (L) 2.59 3.43 75 2.76 80 +6 FEV1/FEV6 (%) 52 78 66 59 75 +12 FEF 25-75% (L/sec) 0.59 2.19 26 0.85 39 +44 FEF Max (L/sec) 5.51 7.53 73 5.26 69 -4 FIVC (L) 2.36 2.72 +15 FIF Max (L/sec) 3.60 3.30 -8 ---- LUNG VOLUMES ---- SVC (L) 3.01 3.64 82 IC (L) 2.26 2.83 79 RV (Pleth) (L) 2.37 2.0   Diverticulosis 12/04/2011   Formatting of this note might be different from the original. per colonoscopy report   Elevated BP without diagnosis of hypertension 03/30/2018   Elevated glucose 09/01/2019   Elevated LDL cholesterol level 04/12/2018   Essential hypertension 10/19/2018   Exercise hypoxemia 02/14/2019   Gastroesophageal reflux disease 10/19/2018   Healthcare maintenance 09/01/2019   Hip pain, bilateral 08/13/2020   History of colonic polyps 06/06/2020   Hyperlipidemia    Need for Tdap vaccination 03/30/2018   Obesity (BMI 35.0-39.9 without comorbidity) 06/19/2020   Other fatigue 06/20/2022   Pain in both lower extremities 08/13/2020   Restless leg syndrome 07/30/2018   Sleep walking 10/19/2018   SOB (shortness of breath) on exertion 03/30/2018   Sprain of right wrist 12/30/2018   Stage 3a chronic kidney disease (HCC) 12/23/2018   Umbilical hernia without obstruction and without gangrene 06/11/2021   Formatting of this note might be different from the original. Added automatically from request for surgery [id number]   Wrist  pain, acute, right 12/23/2018     Objective:   BP 122/71   Pulse 65   Temp 97.6 F (36.4 C)   Ht 5' 3.5" (1.613 m)   Wt 215 lb (97.5 kg)   SpO2 91%   BMI 37.49 kg/m  He was weighed on the bioimpedance scale: Body mass index is 37.49 kg/m.  Peak Weight:220 , Body  Fat%:35.1, Visceral Fat Rating:23, Weight trend over the last 12 months: Increasing  General:  Alert, oriented and cooperative. Patient is in no acute distress.  Respiratory: Normal respiratory effort, no problems with respiration noted   Gait: able to ambulate independently  Mental Status: Normal mood and affect. Normal behavior. Normal judgment and thought content.   DIAGNOSTIC DATA REVIEWED:  BMET    Component Value Date/Time   NA 134 (L) 12/19/2022 1020   K 4.3 12/19/2022 1020   CL 101 12/19/2022 1020   CO2 22 12/19/2022 1020   GLUCOSE 109 (H) 12/19/2022 1020   BUN 20 12/19/2022 1020   CREATININE 1.01 12/19/2022 1020   CALCIUM 9.4 12/19/2022 1020   Lab Results  Component Value Date   HGBA1C 6.1 12/19/2022   HGBA1C 5.8 09/01/2019   No results found for: "INSULIN" CBC    Component Value Date/Time   WBC 6.6 12/19/2022 1020   RBC 5.61 12/19/2022 1020   HGB 16.3 12/19/2022 1020   HCT 49.9 12/19/2022 1020   PLT 151.0 12/19/2022 1020   MCV 89.0 12/19/2022 1020   MCHC 32.6 12/19/2022 1020   RDW 15.0 12/19/2022 1020   Iron/TIBC/Ferritin/ %Sat No results found for: "IRON", "TIBC", "FERRITIN", "IRONPCTSAT" Lipid Panel     Component Value Date/Time   CHOL 133 12/19/2022 1020   TRIG 198.0 (H) 12/19/2022 1020   HDL 34.10 (L) 12/19/2022 1020   CHOLHDL 4 12/19/2022 1020   VLDL 39.6 12/19/2022 1020   LDLCALC 59 12/19/2022 1020   LDLDIRECT 73.0 08/14/2021 1019   Hepatic Function Panel     Component Value Date/Time   PROT 6.8 12/19/2022 1020   ALBUMIN 4.3 12/19/2022 1020   AST 21 12/19/2022 1020   ALT 32 12/19/2022 1020   ALKPHOS 65 12/19/2022 1020   BILITOT 0.7 12/19/2022 1020   BILIDIR 0.1 09/01/2019 0849      Component Value Date/Time   TSH 1.18 06/20/2022 0835     Assessment and Plan:   Prediabetes  Obesity (BMI 35.0-39.9 without comorbidity), Starting BMI 37.48  ESTABLISH WITH HWW   Obesity Treatment / Action Plan:  Patient will work on garnering  support from family and friends to begin weight loss journey. Will work on eliminating or reducing the presence of highly palatable, calorie dense foods in the home. Will complete provided nutritional and psychosocial assessment questionnaire before the next appointment. Will be scheduled for indirect calorimetry to determine resting energy expenditure in a fasting state.  This will allow [place] to create a reduced calorie, high-protein meal plan to promote loss of fat mass while preserving muscle mass. Counseled on the health benefits of losing 5%-15% of total body weight. Was counseled on nutritional approaches to weight loss and benefits of reducing processed foods and consuming plant-based foods and high quality protein as part of nutritional weight management. Was counseled on pharmacotherapy and role as an adjunct in weight management.   Obesity Education Performed Today:  He was weighed on the bioimpedance scale and results were discussed and documented in the synopsis.  We discussed obesity as a disease and the importance of a  more detailed evaluation of all the factors contributing to the disease.  We discussed the importance of long term lifestyle changes which include nutrition, exercise and behavioral modifications as well as the importance of customizing this to his specific health and social needs.  We discussed the benefits of reaching a healthier weight to alleviate the symptoms of existing conditions and reduce the risks of the biomechanical, metabolic and psychological effects of obesity.  Justin Boyd appears to be in the action stage of change and states they are ready to start intensive lifestyle modifications and behavioral modifications.  30 minutes was spent today on this visit including the above counseling, pre-visit chart review, and post-visit documentation.  Reviewed by clinician on day of visit: allergies, medications, problem list, medical history, surgical history,  family history, social history, and previous encounter notes pertinent to obesity diagnosis.   Solara Goodchild d. Briasia Flinders, NP-C

## 2023-05-25 ENCOUNTER — Ambulatory Visit (HOSPITAL_BASED_OUTPATIENT_CLINIC_OR_DEPARTMENT_OTHER)
Admission: RE | Admit: 2023-05-25 | Discharge: 2023-05-25 | Disposition: A | Discharge status: Home / Self Care | Payer: BC Managed Care – PPO | Source: Ambulatory Visit | Attending: Acute Care | Admitting: Acute Care

## 2023-05-25 DIAGNOSIS — Z122 Encounter for screening for malignant neoplasm of respiratory organs: Secondary | ICD-10-CM | POA: Insufficient documentation

## 2023-05-25 DIAGNOSIS — Z87891 Personal history of nicotine dependence: Secondary | ICD-10-CM | POA: Insufficient documentation

## 2023-05-26 ENCOUNTER — Encounter (INDEPENDENT_AMBULATORY_CARE_PROVIDER_SITE_OTHER): Payer: Self-pay | Admitting: Family Medicine

## 2023-05-26 ENCOUNTER — Ambulatory Visit (INDEPENDENT_AMBULATORY_CARE_PROVIDER_SITE_OTHER): Payer: BC Managed Care – PPO | Admitting: Family Medicine

## 2023-05-26 VITALS — BP 115/69 | HR 70 | Temp 98.2°F | Ht 63.0 in | Wt 214.0 lb

## 2023-05-26 DIAGNOSIS — E6609 Other obesity due to excess calories: Secondary | ICD-10-CM

## 2023-05-26 DIAGNOSIS — E559 Vitamin D deficiency, unspecified: Secondary | ICD-10-CM

## 2023-05-26 DIAGNOSIS — Z87891 Personal history of nicotine dependence: Secondary | ICD-10-CM

## 2023-05-26 DIAGNOSIS — E785 Hyperlipidemia, unspecified: Secondary | ICD-10-CM | POA: Diagnosis not present

## 2023-05-26 DIAGNOSIS — E66812 Obesity, class 2: Secondary | ICD-10-CM

## 2023-05-26 DIAGNOSIS — R0602 Shortness of breath: Secondary | ICD-10-CM

## 2023-05-26 DIAGNOSIS — K219 Gastro-esophageal reflux disease without esophagitis: Secondary | ICD-10-CM | POA: Diagnosis not present

## 2023-05-26 DIAGNOSIS — J441 Chronic obstructive pulmonary disease with (acute) exacerbation: Secondary | ICD-10-CM | POA: Diagnosis not present

## 2023-05-26 DIAGNOSIS — R7303 Prediabetes: Secondary | ICD-10-CM

## 2023-05-26 DIAGNOSIS — Z1331 Encounter for screening for depression: Secondary | ICD-10-CM

## 2023-05-26 DIAGNOSIS — R5383 Other fatigue: Secondary | ICD-10-CM | POA: Diagnosis not present

## 2023-05-26 DIAGNOSIS — I10 Essential (primary) hypertension: Secondary | ICD-10-CM

## 2023-05-26 DIAGNOSIS — Z6837 Body mass index (BMI) 37.0-37.9, adult: Secondary | ICD-10-CM

## 2023-05-26 NOTE — Assessment & Plan Note (Signed)
Sees Dr. Nena Alexander.  On pantoprazole 40mg  daily.  Symptoms well managed.

## 2023-05-26 NOTE — Progress Notes (Signed)
Chief Complaint:  Obesity   Subjective:  Justin Boyd (MR# [id number]) is a 73 y.o. male who presents for evaluation and treatment of obesity and related comorbidities.   Justin Boyd is currently in the action stage of change and ready to dedicate time achieving and maintaining a healthier weight. Justin Boyd is interested in becoming our patient and working on intensive lifestyle modifications including (but not limited to) diet and exercise for weight loss.  Justin Boyd has been struggling with his weight. He has been unsuccessful in either losing weight, maintaining weight loss, or reaching his healthy weight goal.  Patient is retired, lives at home with his wife Justin Boyd who is supportive of him.  She is still working and will be retiring in June of this year.  They eat meals together and patient is the cook in the house. Desired weight of 170lbs- last time he was that weight was 6 years ago.  Put on weight after quitting smoking and then COVID closing the gyms.  He was discouraged from joining a gym by pulmonology due to severity of lung disease.   Patient voices his wife is a picky eater.   Food Recall: breakfast is a bowl wheaties (1.5 cups) 2% milk with cut banana (drinks milk afterwards) or toast with peanut butter or guacamole or avocado (1 slice toast, 1/16th of a cup)- feels satisfied.  Also has coffee with half & half (1oz).  Mid morning will be fruit or bellvita cookies- 1.5 cups fruit or 2 cookies; sometimes hunger and sometimes habit. Lunch is cottage cheese 4% (1 cup), handful utz hot potato chips and crumble them and or salsa and siracha or 2 slices of ham 1 slice of cheese and make a roll up.  Feels full.  Mid afternoon same as mid morning. Dinner is fish and vegetables (broccoli, carrots, onions, green beans)- eats around 3.5oz fish and 4 cups of vegetables.  Feels full. After dinner he has a cup of coffee with a cookie.   Patient states that he started gaining weight when he quit smoking.  He  previously tried the [employer] loss diet and lost 40 pounds.  He did not successfully keep that weight off.  He does not eat out in either restaurants nor does he do fast food or takeout.  He does most of the grocery shopping as well as the cooking for both he and his wife.  He likes to cook and his biggest obstacle to cooking is repetition and ensuring that he will cook something that his wife will eat.  He tends to crave food mostly in the afternoon and does not have any food dislikes.  He tends to snack on trail mix, nuts, fruit, and cookies.  He does not skip meals nor does he find eating healthier financially difficult.  He is not trying to be vegetarian or vegan.  He does not wake up in the middle of the night hungry.  Worst food habits are portion size, and left of vegetables.  He sometimes has difficulty knowing when he is comfortably full and occasionally overeats until he is stuffed.  Indirect Calorimeter completed today shows a RMR of 2030. His calculated basal metabolic rate is 0102 thus his basal metabolic rate is better than expected.  Other Fatigue Justin Boyd admits to daytime somnolence and denies waking up still tired. Patient has a history of symptoms of daytime fatigue. Justin Boyd generally gets 6 hours of sleep per night, and states that he has generally restful sleep. Snoring is present. Apneic  episodes are not present. Epworth Sleepiness Score is 10.   Shortness of Breath Justin Boyd notes increasing shortness of breath with exercising and seems to be worsening over time with weight gain. He notes getting out of breath sooner with activity than he used to. This has gotten worse recently. Justin Boyd denies shortness of breath at rest or orthopnea.  Depression Screen Justin Boyd's Food and Mood (modified PHQ-9) score was 4.     06/20/2022    8:03 AM  Depression screen PHQ 2/9  Decreased Interest 0  Down, Depressed, Hopeless 0  PHQ - 2 Score 0     Objective:  Vitals Temp: 98.2 F (36.8  C) BP: 115/69 Pulse Rate: 70 SpO2: 98 %   Anthropometric Measurements Height: 5\' 3"  (1.6 m) Weight: 214 lb (97.1 kg) BMI (Calculated): 37.92 Starting Weight: 214 lb Peak Weight: 220 lb Waist Measurement : 49 inches   Body Composition  Body Fat %: 35.5 % Fat Mass (lbs): 76 lbs Muscle Mass (lbs): 131.2 lbs Total Body Water (lbs): 93.6 lbs Visceral Fat Rating : 23   Other Clinical Data RMR: 2030 Fasting: yes Labs: yes Today's Visit #: 1 Starting Date: 05/26/23    EKG: Normal sinus rhythm, rate 59.  General: Cooperative, alert, well developed, in no acute distress. HEENT: Conjunctivae and lids unremarkable. Cardiovascular: Regular rhythm.  Lungs: Normal work of breathing. Neurologic: No focal deficits.   Lab Results  Component Value Date   CREATININE 1.01 12/19/2022   BUN 20 12/19/2022   NA 134 (L) 12/19/2022   K 4.3 12/19/2022   CL 101 12/19/2022   CO2 22 12/19/2022   Lab Results  Component Value Date   ALT 32 12/19/2022   AST 21 12/19/2022   ALKPHOS 65 12/19/2022   BILITOT 0.7 12/19/2022   Lab Results  Component Value Date   HGBA1C 6.1 12/19/2022   HGBA1C 6.2 08/13/2020   HGBA1C 5.8 09/01/2019   No results found for: "INSULIN" Lab Results  Component Value Date   TSH 1.18 06/20/2022   Lab Results  Component Value Date   CHOL 133 12/19/2022   HDL 34.10 (L) 12/19/2022   LDLCALC 59 12/19/2022   LDLDIRECT 73.0 08/14/2021   TRIG 198.0 (H) 12/19/2022   CHOLHDL 4 12/19/2022   Lab Results  Component Value Date   WBC 6.6 12/19/2022   HGB 16.3 12/19/2022   HCT 49.9 12/19/2022   MCV 89.0 12/19/2022   PLT 151.0 12/19/2022   No results found for: "IRON", "TIBC", "FERRITIN"  Assessment and Plan:   Other Fatigue  Justin Boyd does feel that his weight is causing his energy to be lower than it should be. Fatigue may be related to obesity, depression or many other causes. Labs will be ordered, and in the meanwhile, Justin Boyd will focus on self care  including making healthy food choices, increasing physical activity and focusing on stress reduction.  Shortness of Breath  Justin Boyd does feel that he gets out of breath more easily that he used to when he exercises. 's shortness of breath appears to be obesity related and exercise induced. He has agreed to work on weight loss and gradually increase exercise to treat his exercise induced shortness of breath. Will continue to monitor closely.    Problem List Items Addressed This Visit       Cardiovascular and Mediastinum   Essential hypertension   Patient on amlodipine and metoprolol daily.  Sees cardiology.  BP well controlled today.  CMP today      Relevant  Orders   Comprehensive metabolic panel     Respiratory   COPD with acute exacerbation (HCC)   Sees Dr. Wynona Neat.  Reports that he still experiences shortness of breath.  On trelegy and albuterol.  Experiencing hypoxemia with higher intensity of activity.       Relevant Orders   CBC w/Diff/Platelet     Digestive   Gastroesophageal reflux disease   Sees Dr. Nena Alexander.  On pantoprazole 40mg  daily.  Symptoms well managed.        Other   Vitamin D deficiency   Patient taking 2 gummies of Vitamin D daily.  Last level of 30 in August of 2024.  No nausea, vomiting or muscle weakness.  Similar energy levels compared to prior to starting supplement.      Relevant Orders   VITAMIN D 25 Hydroxy (Vit-D Deficiency, Fractures)   Obesity (BMI 35.0-39.9 without comorbidity)   Other fatigue - Primary   Relevant Orders   Thyroid Panel With TSH   Vitamin B12   Dyslipidemia (high LDL; low HDL)   On rosuvastatin 40mg  daily.  LDL controlled and HDL still low.  Triglycerides remain high.  Repeat FLP today.      Relevant Orders   Lipid Panel With LDL/HDL Ratio   Prediabetes   Patient voices he has just started hearing about prediabetes.  He is on metformin daily.  No GI side effects.  Last A1c of 6.2 in August 2024.  Repeat A1c, and  Insulin today.      Relevant Orders   Hemoglobin A1c   Insulin, random   Class 2 obesity due to excess calories without serious comorbidity with body mass index (BMI) of 37.0 to 37.9 in adult   Starting weight: 214 on 05/26/23 Peak weight: 220 BMR: 2030 on 05/26/23 Previous obesity management: Justin Boyd Weight loss- lost 40lbs in 2018 and kept it off for 2 years.  Then weight started coming back on slowly due to high stress job then more weight gain when patient stopped smoking.  Body Fat %: 35.5% Starting Meal Plan: Category 3 Meal Plan needs: none Likes/ Dislikes of meal plan: n/a       Other Visit Diagnoses       SOBOE (shortness of breath on exertion)         Class 2 severe obesity with serious comorbidity and body mass index (BMI) of 37.0 to 37.9 in adult, unspecified obesity type (HCC)           Curren is currently in the action stage of change and his goal is to continue with weight loss efforts. I recommend Justin Boyd begin the structured treatment plan as follows:  He has agreed to Category 3 Plan  Exercise goals: No exercise has been prescribed at this time.   Behavioral modification strategies:increasing lean protein intake, increasing vegetables, meal planning and cooking strategies, and planning for success  He was informed of the importance of frequent follow-up visits to maximize his success with intensive lifestyle modifications for his multiple health conditions. He was informed we would discuss his lab results at his next visit unless there is a critical issue that needs to be addressed sooner. Justin Boyd agreed to keep his next visit at the agreed upon time to discuss these results.   Attestation Statements:  Reviewed by clinician on day of visit: allergies, medications, problem list, medical history, surgical history, family history, social history, and previous encounter notes. This is the patient's first visit at [facility]. The patient's  NEW PATIENT  PACKET was reviewed at length. Included in the packet: current and past health history, medications, allergies, ROS, gynecologic history (women only), surgical history, family history, social history, weight history, weight loss surgery history (for those that have had weight loss surgery), nutritional evaluation, mood and food questionnaire, PHQ9, Epworth questionnaire, sleep habits questionnaire, patient life and health improvement goals questionnaire. These will all be scanned into the patient's chart under media.   During the visit, I independently reviewed the patient's EKG, bioimpedance scale results, and indirect calorimeter results. I used this information to tailor a meal plan for the patient that will help him to lose weight and will improve his obesity-related conditions going forward. I performed a medically necessary appropriate examination and/or evaluation. I discussed the assessment and treatment plan with the patient. The patient was provided an opportunity to ask questions and all were answered. The patient agreed with the plan and demonstrated an understanding of the instructions. Labs were ordered at this visit and will be reviewed at the next visit unless more critical results need to be addressed immediately. Clinical information was updated and documented in the EMR.   Time spent on visit including pre-visit chart review and post-visit charting and care was 55 minutes.   Justin Boyd Likes, MD

## 2023-05-26 NOTE — Assessment & Plan Note (Signed)
Patient on amlodipine and metoprolol daily.  Sees cardiology.  BP well controlled today.  CMP today

## 2023-05-26 NOTE — Assessment & Plan Note (Signed)
Patient voices he has just started hearing about prediabetes.  He is on metformin daily.  No GI side effects.  Last A1c of 6.2 in August 2024.  Repeat A1c, and Insulin today.

## 2023-05-26 NOTE — Assessment & Plan Note (Signed)
Sees Dr. Wynona Neat.  Reports that he still experiences shortness of breath.  On trelegy and albuterol.  Experiencing hypoxemia with higher intensity of activity.

## 2023-05-26 NOTE — Assessment & Plan Note (Signed)
Starting weight: 214 on 05/26/23 Peak weight: 220 BMR: 2030 on 05/26/23 Previous obesity management: [facility] loss- lost 40lbs in 2018 and kept it off for 2 years.  Then weight started coming back on slowly due to high stress job then more weight gain when patient stopped smoking.  Body Fat %: 35.5% Starting Meal Plan: Category 3 Meal Plan needs: none Likes/ Dislikes of meal plan: n/a

## 2023-05-26 NOTE — Assessment & Plan Note (Signed)
On rosuvastatin 40mg  daily.  LDL controlled and HDL still low.  Triglycerides remain high.  Repeat FLP today.

## 2023-05-26 NOTE — Assessment & Plan Note (Signed)
Patient taking 2 gummies of Vitamin D daily.  Last level of 30 in August of 2024.  No nausea, vomiting or muscle weakness.  Similar energy levels compared to prior to starting supplement.

## 2023-05-28 LAB — VITAMIN B12: Vitamin B-12: 221 pg/mL — ABNORMAL LOW (ref 232–1245)

## 2023-05-28 LAB — COMPREHENSIVE METABOLIC PANEL
ALT: 38 [IU]/L (ref 0–44)
AST: 22 [IU]/L (ref 0–40)
Albumin: 4.3 g/dL (ref 3.9–4.9)
Alkaline Phosphatase: 88 [IU]/L (ref 44–121)
BUN/Creatinine Ratio: 19 (ref 10–24)
BUN: 18 mg/dL (ref 8–27)
Bilirubin Total: 0.4 mg/dL (ref 0.0–1.2)
CO2: 22 mmol/L (ref 20–29)
Calcium: 9.6 mg/dL (ref 8.6–10.2)
Chloride: 101 mmol/L (ref 96–106)
Creatinine, Ser: 0.93 mg/dL (ref 0.76–1.27)
Globulin, Total: 2.3 g/dL (ref 1.5–4.5)
Glucose: 85 mg/dL (ref 70–99)
Potassium: 4.7 mmol/L (ref 3.5–5.2)
Sodium: 142 mmol/L (ref 134–144)
Total Protein: 6.6 g/dL (ref 6.0–8.5)
eGFR: 88 mL/min/{1.73_m2} (ref >59)

## 2023-05-28 LAB — CBC WITH DIFFERENTIAL/PLATELET
Basophils Absolute: 0.1 10*3/uL (ref 0.0–0.2)
Basos: 1 %
EOS (ABSOLUTE): 0.2 10*3/uL (ref 0.0–0.4)
Eos: 3 %
Hematocrit: 50.6 % (ref 37.5–51.0)
Hemoglobin: 16.6 g/dL (ref 13.0–17.7)
Immature Grans (Abs): 0 10*3/uL (ref 0.0–0.1)
Immature Granulocytes: 0 %
Lymphocytes Absolute: 1.9 10*3/uL (ref 0.7–3.1)
Lymphs: 26 %
MCH: 29.5 pg (ref 26.6–33.0)
MCHC: 32.8 g/dL (ref 31.5–35.7)
MCV: 90 fL (ref 79–97)
Monocytes Absolute: 0.8 10*3/uL (ref 0.1–0.9)
Monocytes: 10 %
Neutrophils Absolute: 4.4 10*3/uL (ref 1.4–7.0)
Neutrophils: 60 %
Platelets: 162 10*3/uL (ref 150–450)
RBC: 5.63 x10E6/uL (ref 4.14–5.80)
RDW: 13.1 % (ref 11.6–15.4)
WBC: 7.3 10*3/uL (ref 3.4–10.8)

## 2023-05-28 LAB — LIPID PANEL WITH LDL/HDL RATIO
Cholesterol, Total: 119 mg/dL (ref 100–199)
HDL: 32 mg/dL — ABNORMAL LOW (ref >39)
LDL Chol Calc (NIH): 53 mg/dL (ref 0–99)
LDL/HDL Ratio: 1.7 {ratio} (ref 0.0–3.6)
Triglycerides: 210 mg/dL — ABNORMAL HIGH (ref 0–149)
VLDL Cholesterol Cal: 34 mg/dL (ref 5–40)

## 2023-05-28 LAB — VITAMIN D 25 HYDROXY (VIT D DEFICIENCY, FRACTURES): Vit D, 25-Hydroxy: 42.4 ng/mL (ref 30.0–100.0)

## 2023-05-28 LAB — HEMOGLOBIN A1C
Est. average glucose Bld gHb Est-mCnc: 128 mg/dL
Hgb A1c MFr Bld: 6.1 % — ABNORMAL HIGH (ref 4.8–5.6)

## 2023-05-28 LAB — INSULIN, RANDOM: INSULIN: 17.6 u[IU]/mL (ref 2.6–24.9)

## 2023-06-01 ENCOUNTER — Other Ambulatory Visit: Payer: Self-pay

## 2023-06-01 DIAGNOSIS — Z122 Encounter for screening for malignant neoplasm of respiratory organs: Secondary | ICD-10-CM

## 2023-06-01 DIAGNOSIS — Z87891 Personal history of nicotine dependence: Secondary | ICD-10-CM

## 2023-06-09 ENCOUNTER — Encounter (INDEPENDENT_AMBULATORY_CARE_PROVIDER_SITE_OTHER): Payer: Self-pay | Admitting: Family Medicine

## 2023-06-09 ENCOUNTER — Ambulatory Visit (INDEPENDENT_AMBULATORY_CARE_PROVIDER_SITE_OTHER): Payer: BC Managed Care – PPO | Admitting: Family Medicine

## 2023-06-09 VITALS — BP 120/69 | HR 79 | Temp 98.4°F | Ht 63.0 in | Wt 208.0 lb

## 2023-06-09 DIAGNOSIS — Z6836 Body mass index (BMI) 36.0-36.9, adult: Secondary | ICD-10-CM

## 2023-06-09 DIAGNOSIS — R7303 Prediabetes: Secondary | ICD-10-CM | POA: Diagnosis not present

## 2023-06-09 DIAGNOSIS — E559 Vitamin D deficiency, unspecified: Secondary | ICD-10-CM | POA: Diagnosis not present

## 2023-06-09 DIAGNOSIS — E785 Hyperlipidemia, unspecified: Secondary | ICD-10-CM | POA: Diagnosis not present

## 2023-06-09 DIAGNOSIS — E538 Deficiency of other specified B group vitamins: Secondary | ICD-10-CM | POA: Insufficient documentation

## 2023-06-09 DIAGNOSIS — E66812 Obesity, class 2: Secondary | ICD-10-CM

## 2023-06-09 DIAGNOSIS — E6609 Other obesity due to excess calories: Secondary | ICD-10-CM

## 2023-06-09 DIAGNOSIS — E669 Obesity, unspecified: Secondary | ICD-10-CM

## 2023-06-09 HISTORY — DX: Deficiency of other specified B group vitamins: E53.8

## 2023-06-09 NOTE — Assessment & Plan Note (Signed)
Patient started an OTC Men's MVI from [employer].

## 2023-06-09 NOTE — Assessment & Plan Note (Signed)

## 2023-06-09 NOTE — Assessment & Plan Note (Signed)
Patient on crestor 40mg  daily.  He is not having any side effects of crestor currently.  His LDL is well controlled and at goal, his HDL is low and triglycerides are elevated.

## 2023-06-09 NOTE — Assessment & Plan Note (Signed)
 Discussed importance of vitamin d supplementation.  Vitamin d supplementation has been shown to decrease fatigue, decrease risk of progression to insulin resistance and then prediabetes, decreases risk of falling in older age and can even assist in decreasing depressive symptoms in PTSD.

## 2023-06-09 NOTE — Progress Notes (Signed)
 SUBJECTIVE:  Chief Complaint: Obesity  Interim History: First follow up.  First week he ate whatever was in the house then got the chance to shop for whatever is on plan foodwise.  He has been able to hit 90% of the food plan when he started eating on plan.  He said his wife could not eat all the protein and sometimes he couldn't eat all of that as well. He has a few questions today. No changes requested- is enjoying that his wife is doing this wife him. He mentions that he is trying to ensure that he is focused on getting nutrition in first.  Justin Boyd is here to discuss his progress with his obesity treatment plan. He is on the Category 3 Plan and states he is following his eating plan approximately 90 % of the time. He states he is walking and doing yardwork.  OBJECTIVE: Visit Diagnoses: Problem List Items Addressed This Visit       Other   Vitamin D  deficiency - Primary   Discussed importance of vitamin d  supplementation.  Vitamin d  supplementation has been shown to decrease fatigue, decrease risk of progression to insulin  resistance and then prediabetes, decreases risk of falling in older age and can even assist in decreasing depressive symptoms in PTSD.         Obesity (BMI 35.0-39.9 without comorbidity)   Dyslipidemia (high LDL; low HDL)   Patient on crestor  40mg  daily.  He is not having any side effects of crestor  currently.  His LDL is well controlled and at goal, his HDL is low and triglycerides are elevated.        Prediabetes   Pathophysiology of progression through insulin  resistance to prediabetes and diabetes was discussed at length today.  Patient to continue to monitor and be in control of total intake of snack calories which may be simple carbohydrates but should be consumed only after the patient has taken in all the nutrition for the day.  Macronutrient identification, classification and daily intake ratios were discussed.  Plan to repeat labs in 3 months to monitor both  hemoglobin A1c and insulin  levels.  No medications at this time as patient is not having significant hunger or cravings that would make following meal plan more difficult.         Class 2 obesity due to excess calories without serious comorbidity with body mass index (BMI) of 37.0 to 37.9 in adult   Starting weight: 214 on 05/26/23 Peak weight: 220 BMR: 2030 on 05/26/23 Previous obesity management: [facility] Weight loss- lost 40lbs in 2018 and kept it off for 2 years.  Then weight started coming back on slowly due to high stress job then more weight gain when patient stopped smoking.  Body Fat %: 34.9% Starting Meal Plan: Category 3 Likes/ Dislikes of meal plan: likes quantity- sometimes cannot get all food in       B12 deficiency   Patient started an OTC Men's MVI from [employer].      Other Visit Diagnoses       BMI 36.0-36.9,adult           No data recorded  No data recorded  No data recorded  No data recorded     06/09/2023   11:00 AM 05/26/2023    7:00 AM 04/22/2023    9:00 AM  Vitals with BMI  Height 5' 3 5' 3 5' 3.5  Weight 208 lbs 214 lbs 215 lbs  BMI 36.85 37.92 37.48  Systolic 120  115 122  Diastolic 69 69 71  Pulse 79 70 65     ASSESSMENT AND PLAN:  Diet: Justin Boyd is currently in the action stage of change. As such, his goal is to continue with weight loss efforts. He has agreed to Category 3 Plan.  Exercise: Justin Boyd has been instructed that some exercise is better than none and to continue exercising as is for weight loss and overall health benefits.   Behavior Modification:  We discussed the following Behavioral Modification Strategies today: increasing lean protein intake, increasing vegetables, meal planning and cooking strategies, and keeping healthy foods in the home.   No follow-ups on file.[name] He was informed of the importance of frequent follow up visits to maximize his success with intensive lifestyle modifications for his multiple health  conditions.  Attestation Statements:   Reviewed by clinician on day of visit: allergies, medications, problem list, medical history, surgical history, family history, social history, and previous encounter notes.   Time spent on visit including pre-visit chart review and post-visit care and charting was 45 minutes.    Adelita Cho, MD

## 2023-06-16 NOTE — Assessment & Plan Note (Signed)
Starting weight: 214 on 05/26/23 Peak weight: 220 BMR: 2030 on 05/26/23 Previous obesity management: [facility] loss- lost 40lbs in 2018 and kept it off for 2 years.  Then weight started coming back on slowly due to high stress job then more weight gain when patient stopped smoking.  Body Fat %: 34.9% Starting Meal Plan: Category 3 Likes/ Dislikes of meal plan: likes quantity- sometimes cannot get all food in

## 2023-06-19 NOTE — Telephone Encounter (Signed)
Copied from CRM [id number]. Topic: Clinical - Lab/Test Results >> Jun 19, 2023  3:40 PM Irine Seal wrote: Reason for CRM: patient stated he was seen at [facility] at [facility] he had labs done by Langston Reusing, MD  he is requesting for PCP to review those prior to his upcoming appt

## 2023-06-22 ENCOUNTER — Encounter: Payer: Self-pay | Admitting: Cardiology

## 2023-06-22 DIAGNOSIS — M549 Dorsalgia, unspecified: Secondary | ICD-10-CM | POA: Insufficient documentation

## 2023-06-23 ENCOUNTER — Ambulatory Visit: Payer: BC Managed Care – PPO | Admitting: Family Medicine

## 2023-06-23 ENCOUNTER — Encounter: Payer: Self-pay | Admitting: Family Medicine

## 2023-06-23 VITALS — BP 108/68 | HR 72 | Temp 97.9°F | Ht 63.0 in | Wt 210.8 lb

## 2023-06-23 DIAGNOSIS — R7303 Prediabetes: Secondary | ICD-10-CM | POA: Diagnosis not present

## 2023-06-23 DIAGNOSIS — E785 Hyperlipidemia, unspecified: Secondary | ICD-10-CM | POA: Diagnosis not present

## 2023-06-23 DIAGNOSIS — I10 Essential (primary) hypertension: Secondary | ICD-10-CM

## 2023-06-23 DIAGNOSIS — E669 Obesity, unspecified: Secondary | ICD-10-CM | POA: Diagnosis not present

## 2023-06-23 NOTE — Progress Notes (Signed)
 Established Patient Office Visit   Subjective:  Patient ID: Justin Boyd, male    DOB: [birth year]  Age: 73 y.o. MRN: [id number]  Chief Complaint  Patient presents with   Medical Management of Chronic Issues    3 month follow up. Pt is fasting.     HPI Encounter Diagnoses  Name Primary?   Prediabetes Yes   Obesity (BMI 35.0-39.9 without comorbidity), Starting BMI 37.48    Essential hypertension    Dyslipidemia (high LDL; low HDL)    Continues metformin at at bedtime for prediabetes.  Blood pressure controlled with metoprolol and amlodipine.  LDL cholesterol 53 with rosuvastatin.  Continues to work with weight loss management and has been able to lose a few pounds.  Exercise is difficult for him with his history of COPD.  He becomes winded walking out to his mailbox.   Review of Systems  Constitutional: Negative.   HENT: Negative.    Eyes:  Negative for blurred vision, discharge and redness.  Respiratory:  Positive for shortness of breath. Negative for cough, sputum production and wheezing.   Cardiovascular: Negative.   Gastrointestinal:  Negative for abdominal pain.  Genitourinary: Negative.   Musculoskeletal: Negative.  Negative for myalgias.  Skin:  Negative for rash.  Neurological:  Negative for tingling, loss of consciousness and weakness.  Endo/Heme/Allergies:  Negative for polydipsia.     Current Outpatient Medications:    albuterol (VENTOLIN HFA) 108 (90 Base) MCG/ACT inhaler, Inhale 2 puffs into the lungs every 4 (four) hours as needed for wheezing or shortness of breath., Disp: 1 each, Rfl: 5   amLODipine (NORVASC) 5 MG tablet, TAKE 1 TABLET(5 MG) BY MOUTH DAILY, Disp: 90 tablet, Rfl: 4   Fluticasone-Umeclidin-Vilant (TRELEGY ELLIPTA) 200-62.5-25 MCG/ACT AEPB, Inhale 1 puff into the lungs daily., Disp: 180 each, Rfl: 3   gabapentin (NEURONTIN) 400 MG capsule, TAKE 1 CAPSULE(400 MG) BY MOUTH THREE TIMES DAILY, Disp: 270 capsule, Rfl: 3   metFORMIN (GLUCOPHAGE-XR)  500 MG 24 hr tablet, Take 1 tablet (500 mg total) by mouth at bedtime., Disp: 90 tablet, Rfl: 1   metoprolol succinate (TOPROL-XL) 50 MG 24 hr tablet, TAKE 1 TABLET(50 MG) BY MOUTH DAILY WITH OR IMMEDIATELY FOLLOWING A MEAL, Disp: 90 tablet, Rfl: 4   nitroGLYCERIN (NITROSTAT) 0.4 MG SL tablet, Place 0.4 mg under the tongue every 5 (five) minutes as needed for chest pain., Disp: , Rfl:    pantoprazole (PROTONIX) 40 MG tablet, TAKE 1 TABLET(40 MG) BY MOUTH DAILY, Disp: 90 tablet, Rfl: 3   rosuvastatin (CRESTOR) 40 MG tablet, TAKE 1 TABLET(40 MG) BY MOUTH DAILY, Disp: 90 tablet, Rfl: 3   Objective:     BP 108/68   Pulse 72   Temp 97.9 F (36.6 C)   Ht 5\' 3"  (1.6 m)   Wt 210 lb 12.8 oz (95.6 kg)   SpO2 92%   BMI 37.34 kg/m  Wt Readings from Last 3 Encounters:  06/23/23 210 lb 12.8 oz (95.6 kg)  06/09/23 208 lb (94.3 kg)  05/26/23 214 lb (97.1 kg)      Physical Exam Constitutional:      General: He is not in acute distress.    Appearance: Normal appearance. He is not ill-appearing, toxic-appearing or diaphoretic.  HENT:     Head: Normocephalic and atraumatic.     Right Ear: External ear normal.     Left Ear: External ear normal.  Eyes:     General: No scleral icterus.  Right eye: No discharge.        Left eye: No discharge.     Extraocular Movements: Extraocular movements intact.     Conjunctiva/sclera: Conjunctivae normal.  Cardiovascular:     Rate and Rhythm: Normal rate and regular rhythm.     Heart sounds: Heart sounds are distant.  Pulmonary:     Effort: Pulmonary effort is normal. No respiratory distress.     Breath sounds: Normal breath sounds. No wheezing or rales.  Musculoskeletal:     Cervical back: No rigidity or tenderness.  Skin:    General: Skin is warm and dry.  Neurological:     Mental Status: He is alert and oriented to person, place, and time.  Psychiatric:        Mood and Affect: Mood normal.        Behavior: Behavior normal.      No  results found for any visits on 06/23/23.    The ASCVD Risk score (Arnett DK, et al., 2019) failed to calculate for the following reasons:   The valid total cholesterol range is 130 to 320 mg/dL    Assessment & Plan:   Prediabetes  Obesity (BMI 35.0-39.9 without comorbidity), Starting BMI 37.48  Essential hypertension  Dyslipidemia (high LDL; low HDL)    Return in about 6 months (around 12/21/2023) for annual physical.  Exercise as tolerated.  Continue weight loss efforts.  Continue multivitamin for B12 deficiency.  Continue metformin for prediabetes.  Continue with weight loss management.  Has follow-up scheduled soon with cardiology and pulmonology.  Mliss Sax, MD

## 2023-06-24 ENCOUNTER — Ambulatory Visit: Payer: BC Managed Care – PPO | Attending: Cardiology | Admitting: Cardiology

## 2023-06-24 ENCOUNTER — Encounter: Payer: Self-pay | Admitting: Cardiology

## 2023-06-24 VITALS — BP 124/76 | HR 76 | Ht 63.0 in | Wt 209.0 lb

## 2023-06-24 DIAGNOSIS — I10 Essential (primary) hypertension: Secondary | ICD-10-CM | POA: Diagnosis not present

## 2023-06-24 DIAGNOSIS — E669 Obesity, unspecified: Secondary | ICD-10-CM

## 2023-06-24 DIAGNOSIS — R0609 Other forms of dyspnea: Secondary | ICD-10-CM

## 2023-06-24 DIAGNOSIS — I2510 Atherosclerotic heart disease of native coronary artery without angina pectoris: Secondary | ICD-10-CM

## 2023-06-24 DIAGNOSIS — J432 Centrilobular emphysema: Secondary | ICD-10-CM

## 2023-06-24 DIAGNOSIS — E782 Mixed hyperlipidemia: Secondary | ICD-10-CM

## 2023-06-24 MED ORDER — METOPROLOL TARTRATE 25 MG PO TABS: 25.0000 mg | ORAL_TABLET | Freq: Once | ORAL | 0 refills | Status: DC

## 2023-06-24 NOTE — Patient Instructions (Signed)
 Medication Instructions:    On the morning of your CT, take Metoprolol tartrate 25 mg two hours prior to your CT.  *If you need a refill on your cardiac medications before your next appointment, please call your pharmacy*   Lab Work: Your physician recommends that you have labs done in the office today. Your test included  basic metabolic panel.   Testing/Procedures:  Echocardiogram An echocardiogram is a test that uses sound waves (ultrasound) to produce images of the heart. Images from an echocardiogram can provide important information about: Heart size and shape. The size and thickness and movement of your heart's walls. Heart muscle function and strength. Heart valve function or if you have stenosis. Stenosis is when the heart valves are too narrow. If blood is flowing backward through the heart valves (regurgitation). A tumor or infectious growth around the heart valves. Areas of heart muscle that are not working well because of poor blood flow or injury from a heart attack. Aneurysm detection. An aneurysm is a weak or damaged part of an artery wall. The wall bulges out from the normal force of blood pumping through the body. Tell a health care provider about: Any allergies you have. All medicines you are taking, including vitamins, herbs, eye drops, creams, and over-the-counter medicines. Any blood disorders you have. Any surgeries you have had. Any medical conditions you have. Whether you are pregnant or may be pregnant. What are the risks? Generally, this is a safe test. However, problems may occur, including an allergic reaction to dye (contrast) that may be used during the test. What happens before the test? No specific preparation is needed. You may eat and drink normally. What happens during the test?  You will take off your clothes from the waist up and put on a hospital gown. Electrodes or electrocardiogram (ECG)patches may be placed on your chest. The electrodes  or patches are then connected to a device that monitors your heart rate and rhythm. You will lie down on a table for an ultrasound exam. A gel will be applied to your chest to help sound waves pass through your skin. A handheld device, called a transducer, will be pressed against your chest and moved over your heart. The transducer produces sound waves that travel to your heart and bounce back (or "echo" back) to the transducer. These sound waves will be captured in real-time and changed into images of your heart that can be viewed on a video monitor. The images will be recorded on a computer and reviewed by your health care provider. You may be asked to change positions or hold your breath for a short time. This makes it easier to get different views or better views of your heart. In some cases, you may receive contrast through an IV in one of your veins. This can improve the quality of the pictures from your heart. The procedure may vary among health care providers and hospitals. What can I expect after the test? You may return to your normal, everyday life, including diet, activities, and medicines, unless your health care provider tells you not to do that. Follow these instructions at home: It is up to you to get the results of your test. Ask your health care provider, or the department that is doing the test, when your results will be ready. Keep all follow-up visits. This is important. Summary An echocardiogram is a test that uses sound waves (ultrasound) to produce images of the heart. Images from an echocardiogram can provide  important information about the size and shape of your heart, heart muscle function, heart valve function, and other possible heart problems. You do not need to do anything to prepare before this test. You may eat and drink normally. After the echocardiogram is completed, you may return to your normal, everyday life, unless your health care provider tells you not to do  that. This information is not intended to replace advice given to you by your health care provider. Make sure you discuss any questions you have with your health care provider. Document Revised: 01/02/2021 Document Reviewed: 12/13/2019 Elsevier Patient Education  2023 Elsevier Inc.         Please follow these instructions carefully (unless otherwise directed):  Hold all erectile dysfunction medications at least 3 days (72 hrs) prior to test.  On the Night Before the Test: Be sure to Drink plenty of water. Do not consume any caffeinated/decaffeinated beverages or chocolate 12 hours prior to your test. Do not take any antihistamines 12 hours prior to your test.  On the Day of the Test: Drink plenty of water until 1 hour prior to the test. Do not eat any food 4 hours prior to the test. You may take your regular medications prior to the test.  Take metoprolol (Lopressor)25 mg two hours prior to test. HOLD Furosemide/Hydrochlorothiazide morning of the test.   After the Test: Drink plenty of water. After receiving IV contrast, you may experience a mild flushed feeling. This is normal. On occasion, you may experience a mild rash up to 24 hours after the test. This is not dangerous. If this occurs, you can take Benadryl 25 mg and increase your fluid intake. If you experience trouble breathing, this can be serious. If it is severe call 911 IMMEDIATELY. If it is mild, please call our office. If you take any of these medications: Glipizide/Metformin, Avandament, Glucavance, please do not take 48 hours after completing test unless otherwise instructed.  We will call to schedule your test 2-4 weeks out understanding that some insurance companies will need an authorization prior to the service being performed.   For non-scheduling related questions, please contact the cardiac imaging nurse navigator should you have any questions/concerns: Rockwell Alexandria, Cardiac Imaging Nurse Navigator Larey Brick, Cardiac Imaging Nurse Navigator [facility] Direct Office Dial: [phone]   For scheduling needs, including cancellations and rescheduling, please call [place], [phone].    Follow-Up: At [facility], you and your health needs are our priority.  As part of our continuing mission to provide you with exceptional heart care, we have created designated Provider Care Teams.  These Care Teams include your primary Cardiologist (physician) and Advanced Practice Providers (APPs -  Physician Assistants and Nurse Practitioners) who all work together to provide you with the care you need, when you need it.  We recommend signing up for the patient portal called "MyChart".  Sign up information is provided on this After Visit Summary.  MyChart is used to connect with patients for Virtual Visits (Telemedicine).  Patients are able to view lab/test results, encounter notes, upcoming appointments, etc.  Non-urgent messages can be sent to your provider as well.   To learn more about what you can do with MyChart, go to [email or web address].    Your next appointment:   12 month follow up    Other Instructions Cardiac CT Angiogram A cardiac CT angiogram is a procedure to look at the heart and the area around the heart. It may be  done to help find the cause of chest pains or other symptoms of heart disease. During this procedure, a substance called contrast dye is injected into the blood vessels in the area to be checked. A large X-ray machine, called a CT scanner, then takes detailed pictures of the heart and the surrounding area. The procedure is also sometimes called a coronary CT angiogram, coronary artery scanning, or CTA. A cardiac CT angiogram allows the health care provider to see how well blood is flowing to and from the heart. The health care provider will be able to see if there are any problems, such as: Blockage or narrowing of the coronary arteries in the  heart. Fluid around the heart. Signs of weakness or disease in the muscles, valves, and tissues of the heart. Tell a health care provider about: Any allergies you have. This is especially important if you have had a previous allergic reaction to contrast dye. All medicines you are taking, including vitamins, herbs, eye drops, creams, and over-the-counter medicines. Any blood disorders you have. Any surgeries you have had. Any medical conditions you have. Whether you are pregnant or may be pregnant. Any anxiety disorders, chronic pain, or other conditions you have that may increase your stress or prevent you from lying still. What are the risks? Generally, this is a safe procedure. However, problems may occur, including: Bleeding. Infection. Allergic reactions to medicines or dyes. Damage to other structures or organs. Kidney damage from the contrast dye that is used. Increased risk of cancer from radiation exposure. This risk is low. Talk with your health care provider about: The risks and benefits of testing. How you can receive the lowest dose of radiation. What happens before the procedure? Wear comfortable clothing and remove any jewelry, glasses, dentures, and hearing aids. Follow instructions from your health care provider about eating and drinking. This may include: For 12 hours before the procedure -- avoid caffeine. This includes tea, coffee, soda, energy drinks, and diet pills. Drink plenty of water or other fluids that do not have caffeine in them. Being well hydrated can prevent complications. For 4-6 hours before the procedure -- stop eating and drinking. The contrast dye can cause nausea, but this is less likely if your stomach is empty. Ask your health care provider about changing or stopping your regular medicines. This is especially important if you are taking diabetes medicines, blood thinners, or medicines to treat problems with erections (erectile dysfunction). What  happens during the procedure?  Hair on your chest may need to be removed so that small sticky patches called electrodes can be placed on your chest. These will transmit information that helps to monitor your heart during the procedure. An IV will be inserted into one of your veins. You might be given a medicine to control your heart rate during the procedure. This will help to ensure that good images are obtained. You will be asked to lie on an exam table. This table will slide in and out of the CT machine during the procedure. Contrast dye will be injected into the IV. You might feel warm, or you may get a metallic taste in your mouth. You will be given a medicine called nitroglycerin. This will relax or dilate the arteries in your heart. The table that you are lying on will move into the CT machine tunnel for the scan. The person running the machine will give you instructions while the scans are being done. You may be asked to: Keep your arms above  your head. Hold your breath. Stay very still, even if the table is moving. When the scanning is complete, you will be moved out of the machine. The IV will be removed. The procedure may vary among health care providers and hospitals. What can I expect after the procedure? After your procedure, it is common to have: A metallic taste in your mouth from the contrast dye. A feeling of warmth. A headache from the nitroglycerin. Follow these instructions at home: Take over-the-counter and prescription medicines only as told by your health care provider. If you are told, drink enough fluid to keep your urine pale yellow. This will help to flush the contrast dye out of your body. Most people can return to their normal activities right after the procedure. Ask your health care provider what activities are safe for you. It is up to you to get the results of your procedure. Ask your health care provider, or the department that is doing the procedure, when  your results will be ready. Keep all follow-up visits as told by your health care provider. This is important. Contact a health care provider if: You have any symptoms of allergy to the contrast dye. These include: Shortness of breath. Rash or hives. A racing heartbeat. Summary A cardiac CT angiogram is a procedure to look at the heart and the area around the heart. It may be done to help find the cause of chest pains or other symptoms of heart disease. During this procedure, a large X-ray machine, called a CT scanner, takes detailed pictures of the heart and the surrounding area after a contrast dye has been injected into blood vessels in the area. Ask your health care provider about changing or stopping your regular medicines before the procedure. This is especially important if you are taking diabetes medicines, blood thinners, or medicines to treat erectile dysfunction. If you are told, drink enough fluid to keep your urine pale yellow. This will help to flush the contrast dye out of your body. This information is not intended to replace advice given to you by your health care provider. Make sure you discuss any questions you have with your health care provider. Document Revised: 12/15/2018 Document Reviewed: 12/15/2018 Elsevier Patient Education  2020 [employer].

## 2023-06-24 NOTE — Progress Notes (Signed)
 Cardiology Office Note:    Date:  06/24/2023   ID:  Justin Boyd, DOB 07-30-1950, MRN [id number]  PCP:  Mliss Sax, MD  Cardiologist:  Garwin Brothers, MD   Referring MD: Mliss Sax,*    ASSESSMENT:    1. Essential hypertension   2. Atherosclerosis of native coronary artery of native heart without angina pectoris   3. Centrilobular emphysema (HCC)   4. Obesity (BMI 35.0-39.9 without comorbidity)   5. Mixed hyperlipidemia    PLAN:    In order of problems listed above:  Coronary artery disease: Dyspnea on exertion: Patient's symptoms are very concerning.  He has significant COPD issues but I would like to rule out coronary artery disease especially obstructive CAD as a cause of the symptoms.  I discussed this with him at length and discussed CT coronary angiography with FFR and he is agreeable. Cardiac murmur: Echocardiogram will be done to assess murmur heard on auscultation. Essential hypertension: Blood pressure stable and diet was emphasized.  Lifestyle modification urged. Mixed dyslipidemia: On lipid-lowering medications followed by primary care.  Lipids are at goal. Diabetes mellitus and obesity: Mildly elevated hemoglobin A1c.  Diet emphasized.  Weight reduction stressed.  He is following weight loss clinic and he is focused on losing weight and I congratulated him about this. Patient will be seen in follow-up appointment in 6 months or earlier if the patient has any concerns.    Medication Adjustments/Labs and Tests Ordered: Current medicines are reviewed at length with the patient today.  Concerns regarding medicines are outlined above.  Orders Placed This Encounter  Procedures   EKG 12-Lead   No orders of the defined types were placed in this encounter.    No chief complaint on file.    History of Present Illness:    Justin Boyd is a 73 y.o. male.  Patient has past medical history of aortic atherosclerosis, coronary artery disease,  essential hypertension, mixed dyslipidemia, obesity and COPD.  He is an ex-smoker.  He mentions to me that he has significant shortness of breath on exertion and it is getting worse.  No chest pain orthopnea or PND.  At the time of my evaluation, the patient is alert awake oriented and in no distress.  He is concerned about this.  Past Medical History:  Diagnosis Date   Abnormal CXR 03/30/2018   Anxiety disorder 04/12/2018   Atherosclerosis of coronary artery 05/25/2018   Atherosclerosis of native coronary artery of native heart without angina pectoris 12/30/2018   Atypical squamoproliferative skin lesion 09/01/2019   B12 deficiency 06/09/2023   Back pain    Barrett's esophagus without dysplasia 06/18/2020   Formatting of this note might be different from the original. last EGD 2 yr ago, per [facility]   Centrilobular emphysema (HCC) 09/06/2018   Chicken pox    CKD (chronic kidney disease), stage III (HCC) 12/23/2018   Class 2 obesity due to excess calories without serious comorbidity with body mass index (BMI) of 37.0 to 37.9 in adult 03/23/2023   COPD with acute exacerbation (HCC) 02/14/2019   PFT's 06/2018 FVC (L) 2.65 3.64 72 2.84 78 +7 FEV1 (L) 1.35 2.71 49 1.62 60 +20 FEV1/FVC (%) 51 75 68 57 76 +11 FEV6 (L) 2.59 3.43 75 2.76 80 +6 FEV1/FEV6 (%) 52 78 66 59 75 +12 FEF 25-75% (L/sec) 0.59 2.19 26 0.85 39 +44 FEF Max (L/sec) 5.51 7.53 73 5.26 69 -4 FIVC (L) 2.36 2.72 +15 FIF Max (L/sec) 3.60 3.30 -  8 ---- LUNG VOLUMES ---- SVC (L) 3.01 3.64 82 IC (L) 2.26 2.83 79 RV (Pleth) (L) 2.37 2.0   Diverticulosis 12/04/2011   Formatting of this note might be different from the original. per colonoscopy report   Dyslipidemia (high LDL; low HDL) 02/27/2011   Elevated BP without diagnosis of hypertension 03/30/2018   Elevated glucose 09/01/2019   Elevated LDL cholesterol level 04/12/2018   Essential hypertension 10/19/2018   Exercise hypoxemia 02/14/2019   Gastroesophageal reflux disease  10/19/2018   Healthcare maintenance 09/01/2019   Hip pain, bilateral 08/13/2020   History of colonic polyps 06/06/2020   Hyperlipidemia    Need for Tdap vaccination 03/30/2018   Obesity (BMI 35.0-39.9 without comorbidity) 06/19/2020   Other fatigue 06/20/2022   Pain in both lower extremities 08/13/2020   Prediabetes    Restless leg syndrome 07/30/2018   Sleep walking 10/19/2018   SOB (shortness of breath) on exertion 03/30/2018   Sprain of right wrist 12/30/2018   Stage 3a chronic kidney disease (HCC) 12/23/2018   Umbilical hernia without obstruction and without gangrene 06/11/2021   Formatting of this note might be different from the original. Added automatically from request for surgery [id number]   Vitamin D deficiency    Wrist pain, acute, right 12/23/2018    Past Surgical History:  Procedure Laterality Date   carpal tunnel both hands  2010   HERNIA REPAIR  06/2018   RETINAL DETACHMENT SURGERY  04/2023   Retina Repair   SHOULDER SURGERY Right 2016    Current Medications: Current Meds  Medication Sig   albuterol (VENTOLIN HFA) 108 (90 Base) MCG/ACT inhaler Inhale 2 puffs into the lungs every 4 (four) hours as needed for wheezing or shortness of breath.   amLODipine (NORVASC) 5 MG tablet TAKE 1 TABLET(5 MG) BY MOUTH DAILY   Fluticasone-Umeclidin-Vilant (TRELEGY ELLIPTA) 200-62.5-25 MCG/ACT AEPB Inhale 1 puff into the lungs daily.   gabapentin (NEURONTIN) 400 MG capsule TAKE 1 CAPSULE(400 MG) BY MOUTH THREE TIMES DAILY   metFORMIN (GLUCOPHAGE-XR) 500 MG 24 hr tablet Take 1 tablet (500 mg total) by mouth at bedtime.   metoprolol succinate (TOPROL-XL) 50 MG 24 hr tablet TAKE 1 TABLET(50 MG) BY MOUTH DAILY WITH OR IMMEDIATELY FOLLOWING A MEAL   nitroGLYCERIN (NITROSTAT) 0.4 MG SL tablet Place 0.4 mg under the tongue every 5 (five) minutes as needed for chest pain.   pantoprazole (PROTONIX) 40 MG tablet TAKE 1 TABLET(40 MG) BY MOUTH DAILY   rosuvastatin (CRESTOR) 40 MG tablet TAKE  1 TABLET(40 MG) BY MOUTH DAILY     Allergies:   Oxycodone-acetaminophen, Hydrocodone-acetaminophen, and Penicillins   Social History   Socioeconomic History   Marital status: Married    Spouse name: Not on file   Number of children: Not on file   Years of education: Not on file   Highest education level: Bachelor's degree (e.g., BA, AB, BS)  Occupational History   Occupation: retired  Tobacco Use   Smoking status: Former    Current packs/day: 0.00    Average packs/day: 1 pack/day for 45.0 years (45.0 ttl pk-yrs)    Types: Cigarettes    Start date: 05/05/1973    Quit date: 05/05/2018    Years since quitting: 5.1   Smokeless tobacco: Never  Vaping Use   Vaping status: Never Used  Substance and Sexual Activity   Alcohol use: Yes    Comment: 2 cans of beer a week   Drug use: Never   Sexual activity: Yes    Partners:  Female  Other Topics Concern   Not on file  Social History Narrative   Not on file   Social Drivers of Health   Financial Resource Strain: Low Risk  (06/22/2023)   Overall Financial Resource Strain (CARDIA)    Difficulty of Paying Living Expenses: Not hard at all  Food Insecurity: No Food Insecurity (06/22/2023)   Hunger Vital Sign    Worried About Running Out of Food in the Last Year: Never true    Ran Out of Food in the Last Year: Never true  Transportation Needs: No Transportation Needs (06/22/2023)   PRAPARE - Administrator, Civil Service (Medical): No    Lack of Transportation (Non-Medical): No  Physical Activity: Insufficiently Active (06/22/2023)   Exercise Vital Sign    Days of Exercise per Week: 2 days    Minutes of Exercise per Session: 30 min  Stress: No Stress Concern Present (06/22/2023)   [employer] of Occupational Health - Occupational Stress Questionnaire    Feeling of Stress : Not at all  Social Connections: Socially Isolated (06/22/2023)   Social Connection and Isolation Panel [NHANES]    Frequency of Communication with  Friends and Family: Once a week    Frequency of Social Gatherings with Friends and Family: Never    Attends Religious Services: Never    Database administrator or Organizations: No    Attends Engineer, structural: Not on file    Marital Status: Married     Family History: The patient's family history includes Diabetes in his father; Heart disease in his father and mother; Hypertension in his mother; Obesity in his father; Stroke in his mother; Sudden death in his mother.  ROS:   Please see the history of present illness.    All other systems reviewed and are negative.  EKGs/Labs/Other Studies Reviewed:    The following studies were reviewed today: .Marland KitchenEKG Interpretation Date/Time:  Wednesday June 24 2023 09:56:50 EST Ventricular Rate:  76 PR Interval:  140 QRS Duration:  92 QT Interval:  374 QTC Calculation: 420 R Axis:   69  Text Interpretation: Normal sinus rhythm Incomplete right bundle branch block Nonspecific ST and T wave abnormality When compared with ECG of 02-Nov-2009 11:16, Nonspecific T wave abnormality now evident in Anterior leads Confirmed by Belva Crome [id number]) on 06/24/2023 10:27:02 AM     Recent Labs: 05/26/2023: ALT 38; BUN 18; Creatinine, Ser 0.93; Hemoglobin 16.6; Platelets 162; Potassium 4.7; Sodium 142  Recent Lipid Panel    Component Value Date/Time   CHOL 119 05/26/2023 1248   TRIG 210 (H) 05/26/2023 1248   HDL 32 (L) 05/26/2023 1248   CHOLHDL 4 12/19/2022 1020   VLDL 39.6 12/19/2022 1020   LDLCALC 53 05/26/2023 1248   LDLDIRECT 73.0 08/14/2021 1019    Physical Exam:    VS:  BP 124/76   Pulse 76   Ht 5\' 3"  (1.6 m)   Wt 209 lb (94.8 kg)   SpO2 91%   BMI 37.02 kg/m     Wt Readings from Last 3 Encounters:  06/24/23 209 lb (94.8 kg)  06/23/23 210 lb 12.8 oz (95.6 kg)  06/09/23 208 lb (94.3 kg)     GEN: Patient is in no acute distress HEENT: Normal NECK: No JVD; No carotid bruits LYMPHATICS: No lymphadenopathy CARDIAC:  Hear sounds regular, 2/6 systolic murmur at the apex. RESPIRATORY:  Clear to auscultation without rales, wheezing or rhonchi  ABDOMEN: Soft, non-tender, non-distended MUSCULOSKELETAL:  No edema;  No deformity  SKIN: Warm and dry NEUROLOGIC:  Alert and oriented x 3 PSYCHIATRIC:  Normal affect   Signed, Garwin Brothers, MD  06/24/2023 10:32 AM    [facility]

## 2023-06-25 ENCOUNTER — Other Ambulatory Visit: Payer: Self-pay | Admitting: Cardiology

## 2023-06-25 DIAGNOSIS — R0609 Other forms of dyspnea: Secondary | ICD-10-CM | POA: Diagnosis not present

## 2023-06-26 LAB — BASIC METABOLIC PANEL
BUN/Creatinine Ratio: 21 (ref 10–24)
BUN: 23 mg/dL (ref 8–27)
CO2: 18 mmol/L — ABNORMAL LOW (ref 20–29)
Calcium: 9.7 mg/dL (ref 8.6–10.2)
Chloride: 104 mmol/L (ref 96–106)
Creatinine, Ser: 1.1 mg/dL (ref 0.76–1.27)
Glucose: 108 mg/dL — ABNORMAL HIGH (ref 70–99)
Potassium: 4.2 mmol/L (ref 3.5–5.2)
Sodium: 143 mmol/L (ref 134–144)
eGFR: 72 mL/min/{1.73_m2} (ref >59)

## 2023-06-29 ENCOUNTER — Telehealth: Payer: Self-pay | Admitting: Cardiology

## 2023-06-29 MED ORDER — NITROGLYCERIN 0.4 MG SL SUBL: 0.4000 mg | SUBLINGUAL_TABLET | SUBLINGUAL | 6 refills | Status: AC | PRN

## 2023-06-29 NOTE — Telephone Encounter (Signed)
*  STAT* If patient is at the pharmacy, call can be transferred to refill team.   1. Which medications need to be refilled? (please list name of each medication and dose if known)  nitroGLYCERIN (NITROSTAT) 0.4 MG SL tablet  2. Which pharmacy/location (including street and city if local pharmacy) is medication to be sent to? [id number] - [place], [place] - [address]  3. Do they need a 30 day or 90 day supply?   Standard supply. Prescription expired.

## 2023-06-29 NOTE — Telephone Encounter (Signed)
 RX sent

## 2023-07-02 ENCOUNTER — Ambulatory Visit (INDEPENDENT_AMBULATORY_CARE_PROVIDER_SITE_OTHER): Payer: BC Managed Care – PPO | Admitting: Family Medicine

## 2023-07-02 ENCOUNTER — Encounter (INDEPENDENT_AMBULATORY_CARE_PROVIDER_SITE_OTHER): Payer: Self-pay | Admitting: Family Medicine

## 2023-07-02 VITALS — BP 108/67 | HR 75 | Temp 97.9°F | Ht 63.0 in | Wt 203.0 lb

## 2023-07-02 DIAGNOSIS — R7303 Prediabetes: Secondary | ICD-10-CM | POA: Diagnosis not present

## 2023-07-02 DIAGNOSIS — E669 Obesity, unspecified: Secondary | ICD-10-CM

## 2023-07-02 DIAGNOSIS — E66812 Obesity, class 2: Secondary | ICD-10-CM

## 2023-07-02 DIAGNOSIS — Z6835 Body mass index (BMI) 35.0-35.9, adult: Secondary | ICD-10-CM | POA: Diagnosis not present

## 2023-07-02 DIAGNOSIS — I10 Essential (primary) hypertension: Secondary | ICD-10-CM

## 2023-07-02 NOTE — Progress Notes (Signed)
   SUBJECTIVE:  Chief Complaint: Obesity  Interim History: Patient had a really good February since last appointment.  He has been doing more yardwork to prep for the spring.  He has been following meal plan pretty strictly.  He is starting to struggle with the dinner meal because he and his wife do not eat red meat.  He and his wife are wondering about what meat options he and his wife can have for dinner.   Justin Boyd is here to discuss his progress with his obesity treatment plan. He is on the Category 3 Plan and states he is following his eating plan approximately 90-95 % of the time. He states he is walking and yardwork 30-120 minutes 3-5 times per week.   OBJECTIVE: Visit Diagnoses: Problem List Items Addressed This Visit       Cardiovascular and Mediastinum   Essential hypertension   Blood pressure well controlled today.  No chest pain, chest pressure, headache.  Doing well on amlodipine and metoprolol.  Continue current meds with no change in dose.        Other   Obesity (BMI 35.0-39.9 without comorbidity)   Prediabetes - Primary   Discussed metformin usage today with patient- PCP tried to prescribe tirzepatide but it wasn't covered.  Doing well on metformin.      Class 2 obesity due to excess calories without serious comorbidity with body mass index (BMI) of 37.0 to 37.9 in adult   Anthropometric Measurements Height: 5\' 3"  (1.6 m) Weight: 203 lb (92.1 kg) BMI (Calculated): 35.97 Weight at Last Visit: 208 lb Weight Lost Since Last Visit: 5 Weight Gained Since Last Visit: 0 Starting Weight: 214 lb Total Weight Loss (lbs): 11 lb (4.99 kg) Body Composition  Body Fat %: 33.7 % Fat Mass (lbs): 68.4 lbs Muscle Mass (lbs): 128 lbs Total Body Water (lbs): 91.8 lbs Visceral Fat Rating : 22 Other Clinical Data Today's Visit #: 3 Starting Date: 05/26/23 Comments: Cat 3       Other Visit Diagnoses       BMI 35.0-35.9,adult           No data recorded    07/02/2023     2:00 PM 06/24/2023    9:52 AM 06/23/2023    8:17 AM  Vitals with BMI  Height 5\' 3"  5\' 3"  5\' 3"   Weight 203 lbs 209 lbs 210 lbs 13 oz  BMI 35.97 37.03 37.35  Systolic 108 124 409  Diastolic 67 76 68  Pulse 75 76 72         ASSESSMENT AND PLAN:  Diet: Justin Boyd is currently in the action stage of change. As such, his goal is to continue with weight loss efforts and has agreed to the Category 3 Plan.   Exercise:  Older adults should determine their level of effort for physical activity relative to their level of fitness.  Behavior Modification:  We discussed the following Behavioral Modification Strategies today: increasing lean protein intake, increasing vegetables, meal planning and cooking strategies, and avoiding temptations.   No follow-ups on file.Marland Kitchen He was informed of the importance of frequent follow up visits to maximize his success with intensive lifestyle modifications for his multiple health conditions.  Attestation Statements:   Reviewed by clinician on day of visit: allergies, medications, problem list, medical history, surgical history, family history, social history, and previous encounter notes.     Reuben Likes, MD

## 2023-07-02 NOTE — Assessment & Plan Note (Addendum)
 Discussed metformin usage today with patient- PCP tried to prescribe tirzepatide but it wasn't covered.  Doing well on metformin.

## 2023-07-08 ENCOUNTER — Encounter (INDEPENDENT_AMBULATORY_CARE_PROVIDER_SITE_OTHER): Payer: Self-pay | Admitting: Family Medicine

## 2023-07-13 ENCOUNTER — Ambulatory Visit (HOSPITAL_BASED_OUTPATIENT_CLINIC_OR_DEPARTMENT_OTHER)
Admission: RE | Admit: 2023-07-13 | Discharge: 2023-07-13 | Disposition: A | Discharge status: Home / Self Care | Payer: BC Managed Care – PPO | Source: Ambulatory Visit | Attending: Cardiology | Admitting: Cardiology

## 2023-07-13 DIAGNOSIS — R0609 Other forms of dyspnea: Secondary | ICD-10-CM | POA: Diagnosis not present

## 2023-07-13 LAB — ECHOCARDIOGRAM COMPLETE
AR max vel: 3.25 cm2
AV Area VTI: 3.14 cm2
AV Area mean vel: 3.03 cm2
AV Mean grad: 3 mmHg
AV Peak grad: 6.8 mmHg
Ao pk vel: 1.3 m/s
Area-P 1/2: 2.36 cm2
Calc EF: 61.5 %
S' Lateral: 2.3 cm
Single Plane A2C EF: 57.5 %
Single Plane A4C EF: 65.9 %

## 2023-07-13 NOTE — Assessment & Plan Note (Signed)
 Anthropometric Measurements Height: 5\' 3"  (1.6 m) Weight: 203 lb (92.1 kg) BMI (Calculated): 35.97 Weight at Last Visit: 208 lb Weight Lost Since Last Visit: 5 Weight Gained Since Last Visit: 0 Starting Weight: 214 lb Total Weight Loss (lbs): 11 lb (4.99 kg) Body Composition  Body Fat %: 33.7 % Fat Mass (lbs): 68.4 lbs Muscle Mass (lbs): 128 lbs Total Body Water (lbs): 91.8 lbs Visceral Fat Rating : 22 Other Clinical Data Today's Visit #: 3 Starting Date: 05/26/23 Comments: Cat 3

## 2023-07-13 NOTE — Assessment & Plan Note (Signed)
 Blood pressure well controlled today.  No chest pain, chest pressure, headache.  Doing well on amlodipine and metoprolol.  Continue current meds with no change in dose.

## 2023-07-23 ENCOUNTER — Other Ambulatory Visit (HOSPITAL_BASED_OUTPATIENT_CLINIC_OR_DEPARTMENT_OTHER): Payer: Self-pay

## 2023-07-23 DIAGNOSIS — J432 Centrilobular emphysema: Secondary | ICD-10-CM

## 2023-07-24 ENCOUNTER — Encounter (HOSPITAL_COMMUNITY): Payer: Self-pay

## 2023-07-27 ENCOUNTER — Encounter (INDEPENDENT_AMBULATORY_CARE_PROVIDER_SITE_OTHER): Payer: Self-pay | Admitting: Family Medicine

## 2023-07-27 ENCOUNTER — Ambulatory Visit (INDEPENDENT_AMBULATORY_CARE_PROVIDER_SITE_OTHER): Payer: BC Managed Care – PPO | Admitting: Family Medicine

## 2023-07-27 VITALS — BP 105/70 | HR 80 | Temp 97.7°F | Ht 63.0 in | Wt 200.0 lb

## 2023-07-27 DIAGNOSIS — Z6835 Body mass index (BMI) 35.0-35.9, adult: Secondary | ICD-10-CM | POA: Diagnosis not present

## 2023-07-27 DIAGNOSIS — E785 Hyperlipidemia, unspecified: Secondary | ICD-10-CM | POA: Diagnosis not present

## 2023-07-27 DIAGNOSIS — E782 Mixed hyperlipidemia: Secondary | ICD-10-CM

## 2023-07-27 DIAGNOSIS — I10 Essential (primary) hypertension: Secondary | ICD-10-CM

## 2023-07-27 DIAGNOSIS — E669 Obesity, unspecified: Secondary | ICD-10-CM | POA: Diagnosis not present

## 2023-07-27 NOTE — Assessment & Plan Note (Signed)
 Blood pressure very well controlled today.  No chest pain, chest pressure or headache.  On norvasc, and toprol.  Continue current meds- consider decreasing medication if BP continues to stay controlled at next appointment.

## 2023-07-27 NOTE — Progress Notes (Signed)
   SUBJECTIVE:  Chief Complaint: Obesity  Interim History: Patient brought list of questions today.  He is wondering about incorporating chili into his intake from [employer].  This is on the warm food options.  He went to [employer] today and forgot his wallet. He would also like spaghetti sauce with the red lentil pasta.  He is also interested in having Wheaties protein for breakfast.  He is interested in the real good microwave meals and the enlighted ice cream bars.  Ultimately he is doing very well. Over the next few weeks he does not have anything planned or scheduled.   Justin Boyd is here to discuss his progress with his obesity treatment plan. He is on the Category 3 Plan and states he is following his eating plan approximately 95 % of the time. He states he is walking 1-1 1/2 mile  3/4 times per week + yard work.   OBJECTIVE: Visit Diagnoses: Problem List Items Addressed This Visit       Cardiovascular and Mediastinum   Essential hypertension - Primary   Blood pressure very well controlled today.  No chest pain, chest pressure or headache.  On norvasc, and toprol.  Continue current meds- consider decreasing medication if BP continues to stay controlled at next appointment.        Other   Hyperlipidemia   On crestor and has CT with contrast tomorrow.  Will follow up on results and treatment plan at next appointment.      Obesity (BMI 35.0-39.9 without comorbidity)   Other Visit Diagnoses       BMI 35.0-35.9,adult           Vitals Temp: 97.7 F (36.5 C) BP: 105/70 Pulse Rate: 80 SpO2: 92 %   Anthropometric Measurements Height: 5\' 3"  (1.6 m) Weight: 200 lb (90.7 kg) BMI (Calculated): 35.44 Weight at Last Visit: 203 lb Weight Lost Since Last Visit: 3 Weight Gained Since Last Visit: 0 Starting Weight: 214 lb Total Weight Loss (lbs): 14 lb (6.35 kg)   Body Composition  Body Fat %: 33.7 % Fat Mass (lbs): 67.4 lbs Muscle Mass (lbs): 126.2 lbs Total Body Water (lbs):  91.6 lbs Visceral Fat Rating : 21   Other Clinical Data Today's Visit #: 4 Starting Date: 05/26/23 Comments: Cat 3     ASSESSMENT AND PLAN:  Diet: Justin Boyd is currently in the action stage of change. As such, his goal is to continue with weight loss efforts and has agreed to the Category 3 Plan.   Exercise:  Older adults should follow the adult guidelines. When older adults cannot meet the adult guidelines, they should be as physically active as their abilities and conditions will allow.  Behavior Modification:  We discussed the following Behavioral Modification Strategies today: increasing lean protein intake, increasing vegetables, meal planning and cooking strategies, avoiding temptations, and planning for success.   Return in about 3 weeks (around 08/17/2023).Marland Kitchen He was informed of the importance of frequent follow up visits to maximize his success with intensive lifestyle modifications for his multiple health conditions.  Attestation Statements:   Reviewed by clinician on day of visit: allergies, medications, problem list, medical history, surgical history, family history, social history, and previous encounter notes.    Justin Likes, MD

## 2023-07-27 NOTE — Assessment & Plan Note (Signed)
 On crestor and has CT with contrast tomorrow.  Will follow up on results and treatment plan at next appointment.

## 2023-07-28 ENCOUNTER — Ambulatory Visit (HOSPITAL_BASED_OUTPATIENT_CLINIC_OR_DEPARTMENT_OTHER)
Admission: RE | Admit: 2023-07-28 | Discharge: 2023-07-28 | Disposition: A | Discharge status: Home / Self Care | Payer: BC Managed Care – PPO | Source: Ambulatory Visit | Attending: Cardiology | Admitting: Cardiology

## 2023-07-28 DIAGNOSIS — R0609 Other forms of dyspnea: Secondary | ICD-10-CM | POA: Insufficient documentation

## 2023-07-28 MED ORDER — METOPROLOL TARTRATE 5 MG/5ML IV SOLN
INTRAVENOUS | Status: AC
Start: 2023-07-28 — End: 2023-07-28
  Administered 2023-07-28: 5 mg via INTRAVENOUS
  Filled 2023-07-28: qty 10

## 2023-07-28 MED ORDER — NITROGLYCERIN 0.4 MG SL SUBL: 0.8000 mg | SUBLINGUAL_TABLET | Freq: Once | SUBLINGUAL | Status: AC

## 2023-07-28 MED ORDER — IOHEXOL 350 MG/ML SOLN
95.0000 mL | Freq: Once | INTRAVENOUS | Status: AC | PRN
  Administered 2023-07-28: 95 mL via INTRAVENOUS

## 2023-07-28 MED ORDER — NITROGLYCERIN 0.4 MG SL SUBL
SUBLINGUAL_TABLET | SUBLINGUAL | Status: AC
  Administered 2023-07-28: 0.8 mg via SUBLINGUAL
  Filled 2023-07-28: qty 15

## 2023-07-28 MED ORDER — METOPROLOL TARTRATE 5 MG/5ML IV SOLN
5.0000 mg | Freq: Once | INTRAVENOUS | Status: AC
Start: 2023-07-28 — End: 2023-07-28

## 2023-07-29 ENCOUNTER — Encounter (HOSPITAL_BASED_OUTPATIENT_CLINIC_OR_DEPARTMENT_OTHER): Payer: BC Managed Care – PPO

## 2023-08-11 ENCOUNTER — Other Ambulatory Visit: Payer: Self-pay

## 2023-08-11 DIAGNOSIS — K219 Gastro-esophageal reflux disease without esophagitis: Secondary | ICD-10-CM

## 2023-08-11 MED ORDER — PANTOPRAZOLE SODIUM 40 MG PO TBEC: 40.0000 mg | DELAYED_RELEASE_TABLET | Freq: Every day | ORAL | 3 refills | Status: AC

## 2023-08-12 ENCOUNTER — Encounter (INDEPENDENT_AMBULATORY_CARE_PROVIDER_SITE_OTHER): Payer: Self-pay | Admitting: Family Medicine

## 2023-08-12 NOTE — Telephone Encounter (Signed)
 Please review

## 2023-08-14 ENCOUNTER — Other Ambulatory Visit: Payer: Self-pay

## 2023-08-14 ENCOUNTER — Other Ambulatory Visit: Payer: Self-pay | Admitting: Family Medicine

## 2023-08-14 DIAGNOSIS — I2510 Atherosclerotic heart disease of native coronary artery without angina pectoris: Secondary | ICD-10-CM

## 2023-08-14 DIAGNOSIS — I10 Essential (primary) hypertension: Secondary | ICD-10-CM

## 2023-08-14 MED ORDER — METOPROLOL SUCCINATE ER 50 MG PO TB24: ORAL_TABLET | ORAL | 4 refills | Status: AC

## 2023-08-20 ENCOUNTER — Encounter (HOSPITAL_BASED_OUTPATIENT_CLINIC_OR_DEPARTMENT_OTHER)

## 2023-08-24 ENCOUNTER — Ambulatory Visit (HOSPITAL_BASED_OUTPATIENT_CLINIC_OR_DEPARTMENT_OTHER): Admitting: Pulmonary Disease

## 2023-08-24 DIAGNOSIS — J432 Centrilobular emphysema: Secondary | ICD-10-CM | POA: Diagnosis not present

## 2023-08-24 LAB — PULMONARY FUNCTION TEST
DL/VA % pred: 71 %
DL/VA: 2.98 ml/min/mmHg/L
DLCO cor % pred: 71 %
DLCO cor: 15.2 ml/min/mmHg
DLCO unc % pred: 71 %
DLCO unc: 15.2 ml/min/mmHg
FEF 25-75 Post: 0.91 L/s
FEF 25-75 Pre: 0.61 L/s
FEF2575-%Change-Post: 48 %
FEF2575-%Pred-Post: 46 %
FEF2575-%Pred-Pre: 31 %
FEV1-%Change-Post: 12 %
FEV1-%Pred-Post: 62 %
FEV1-%Pred-Pre: 55 %
FEV1-Post: 1.59 L
FEV1-Pre: 1.41 L
FEV1FVC-%Change-Post: 0 %
FEV1FVC-%Pred-Pre: 78 %
FEV6-%Change-Post: 14 %
FEV6-%Pred-Post: 83 %
FEV6-%Pred-Pre: 72 %
FEV6-Post: 2.7 L
FEV6-Pre: 2.35 L
FEV6FVC-%Change-Post: 2 %
FEV6FVC-%Pred-Post: 104 %
FEV6FVC-%Pred-Pre: 102 %
FVC-%Change-Post: 12 %
FVC-%Pred-Post: 79 %
FVC-%Pred-Pre: 70 %
FVC-Post: 2.76 L
FVC-Pre: 2.45 L
Post FEV1/FVC ratio: 58 %
Post FEV6/FVC ratio: 98 %
Pre FEV1/FVC ratio: 57 %
Pre FEV6/FVC Ratio: 96 %
RV % pred: 148 %
RV: 3.15 L
TLC % pred: 104 %
TLC: 6.05 L

## 2023-08-24 NOTE — Progress Notes (Signed)
 Full PFT Performed Today

## 2023-08-24 NOTE — Patient Instructions (Signed)
 Full PFT Performed Today

## 2023-08-27 ENCOUNTER — Ambulatory Visit (INDEPENDENT_AMBULATORY_CARE_PROVIDER_SITE_OTHER): Admitting: Family Medicine

## 2023-08-27 ENCOUNTER — Encounter (INDEPENDENT_AMBULATORY_CARE_PROVIDER_SITE_OTHER): Payer: Self-pay | Admitting: Family Medicine

## 2023-08-27 ENCOUNTER — Encounter: Payer: Self-pay | Admitting: Pulmonary Disease

## 2023-08-27 VITALS — BP 104/56 | HR 55 | Temp 97.7°F | Ht 63.0 in | Wt 192.0 lb

## 2023-08-27 DIAGNOSIS — I2510 Atherosclerotic heart disease of native coronary artery without angina pectoris: Secondary | ICD-10-CM | POA: Diagnosis not present

## 2023-08-27 DIAGNOSIS — Z6834 Body mass index (BMI) 34.0-34.9, adult: Secondary | ICD-10-CM

## 2023-08-27 DIAGNOSIS — I10 Essential (primary) hypertension: Secondary | ICD-10-CM

## 2023-08-27 DIAGNOSIS — E66812 Obesity, class 2: Secondary | ICD-10-CM | POA: Diagnosis not present

## 2023-08-27 DIAGNOSIS — E6609 Other obesity due to excess calories: Secondary | ICD-10-CM

## 2023-08-27 MED ORDER — AMLODIPINE BESYLATE 5 MG PO TABS: 2.5000 mg | ORAL_TABLET | Freq: Every day | ORAL | Status: DC

## 2023-08-27 NOTE — Assessment & Plan Note (Signed)
 On norvasc  and metoprolol .  BP has been very well controlled today and has been.  Will decrease amlodipine  to 2.5mg  daily (patient to cut pill in half).

## 2023-08-27 NOTE — Progress Notes (Signed)
   SUBJECTIVE:  Chief Complaint: Obesity  Interim History: Since last appointment patient got cardiac CT- medical management as treatment- patient on 40mg  rosuvastatin  and told to monitor food intake and increase physical activity.  He has been following the meal plan fairly strictly and hasn't had any deviations since the first day.  He likes the vegetable portion of food intake because he is getting foods he likes to eat.  No anticipated travel or activities for the next 3-4 weeks.  Hazel is here to discuss his progress with his obesity treatment plan. He is on the Category 3 Plan and states he is following his eating plan approximately 90 % of the time. He states he is walking 1-1.5 miles 3 times per week.   OBJECTIVE: Visit Diagnoses: Problem List Items Addressed This Visit       Cardiovascular and Mediastinum   Essential hypertension   On norvasc  and metoprolol .  BP has been very well controlled today and has been.  Will decrease amlodipine  to 2.5mg  daily (patient to cut pill in half).        Relevant Medications   amLODipine  (NORVASC ) 5 MG tablet   Atherosclerosis of native coronary artery of native heart without angina pectoris   Relevant Medications   amLODipine  (NORVASC ) 5 MG tablet     Other   Class 2 obesity due to excess calories without serious comorbidity with body mass index (BMI) of 37.0 to 37.9 in adult - Primary   Other Visit Diagnoses       BMI 34.0-34.9,adult           Vitals Temp: 97.7 F (36.5 C) BP: (!) 104/56 Pulse Rate: (!) 55 SpO2: 91 %   Anthropometric Measurements Height: 5\' 3"  (1.6 m) Weight: 192 lb (87.1 kg) BMI (Calculated): 34.02 Weight at Last Visit: 200 lb Weight Lost Since Last Visit: 8 Weight Gained Since Last Visit: 0 Starting Weight: 214 lb Total Weight Loss (lbs): 22 lb (9.979 kg)   Body Composition  Body Fat %: 32.7 % Fat Mass (lbs): 62.8 lbs Muscle Mass (lbs): 122.8 lbs Total Body Water (lbs): 89 lbs Visceral Fat  Rating : 20   Other Clinical Data Today's Visit #: 5 Starting Date: 05/26/23 Comments: Cat 3     ASSESSMENT AND PLAN:  Diet: Lord is currently in the action stage of change. As such, his goal is to continue with weight loss efforts and has agreed to the Category 3 Plan.   Exercise:  Older adults should determine their level of effort for physical activity relative to their level of fitness.  Behavior Modification:  We discussed the following Behavioral Modification Strategies today: increasing lean protein intake, decreasing simple carbohydrates, meal planning and cooking strategies, keeping healthy foods in the home, and avoiding temptations.   Return in about 3 weeks (around 09/17/2023).Aaron Aas He was informed of the importance of frequent follow up visits to maximize his success with intensive lifestyle modifications for his multiple health conditions.  Attestation Statements:   Reviewed by clinician on day of visit: allergies, medications, problem list, medical history, surgical history, family history, social history, and previous encounter notes.   Donaciano Frizzle, MD

## 2023-09-03 ENCOUNTER — Encounter: Payer: Self-pay | Admitting: Pulmonary Disease

## 2023-09-14 ENCOUNTER — Other Ambulatory Visit: Payer: Self-pay | Admitting: Family Medicine

## 2023-09-14 DIAGNOSIS — R7303 Prediabetes: Secondary | ICD-10-CM

## 2023-09-16 DIAGNOSIS — H353131 Nonexudative age-related macular degeneration, bilateral, early dry stage: Secondary | ICD-10-CM | POA: Diagnosis not present

## 2023-10-07 ENCOUNTER — Ambulatory Visit (INDEPENDENT_AMBULATORY_CARE_PROVIDER_SITE_OTHER): Admitting: Family Medicine

## 2023-10-07 ENCOUNTER — Encounter (INDEPENDENT_AMBULATORY_CARE_PROVIDER_SITE_OTHER): Payer: Self-pay | Admitting: Family Medicine

## 2023-10-07 VITALS — BP 98/64 | HR 60 | Temp 98.0°F | Ht 63.0 in | Wt 181.0 lb

## 2023-10-07 DIAGNOSIS — Z6832 Body mass index (BMI) 32.0-32.9, adult: Secondary | ICD-10-CM

## 2023-10-07 DIAGNOSIS — I10 Essential (primary) hypertension: Secondary | ICD-10-CM

## 2023-10-07 DIAGNOSIS — E669 Obesity, unspecified: Secondary | ICD-10-CM

## 2023-10-07 NOTE — Progress Notes (Signed)
   SUBJECTIVE:  Chief Complaint: Obesity  Interim History: Since last appointment patient has increased his physical activity and he has been hitting golf balls for about an hour. He is getting quite a bit more exercise overall.  Overall he has been doing very well with his food intake staying consistent with his Category 3.  His wife's birthday is in 2 weeks and his birthday is in 3 weeks.  Last day of work will be July and then she will be retiring.   Justin Boyd is here to discuss his progress with his obesity treatment plan. He is on the Category 3 Plan and states he is following his eating plan approximately 90 % of the time. He states he is playing golf 1-4 hours 4 times a week and walking 60 minutes 3 times per week.   OBJECTIVE: Visit Diagnoses: Problem List Items Addressed This Visit       Cardiovascular and Mediastinum   Essential hypertension   BP low today.  No dizziness or lightheadedness.  On amlodipine  and metoprolol  daily.  Can stop amlodipine  as BP has been on lower side.        Other   Obesity (BMI 35.0-39.9 without comorbidity) - Primary   Other Visit Diagnoses       BMI 32.0-32.9,adult           Vitals Temp: 98 F (36.7 C) BP: 98/64 Pulse Rate: 60 SpO2: 99 %   Anthropometric Measurements Height: 5\' 3"  (1.6 m) Weight: 181 lb (82.1 kg) BMI (Calculated): 32.07 Weight at Last Visit: 192 lb Weight Lost Since Last Visit: 11 Weight Gained Since Last Visit: 0 Starting Weight: 214 lb Total Weight Loss (lbs): 33 lb (15 kg)   Body Composition  Body Fat %: 30 % Fat Mass (lbs): 54.4 lbs Muscle Mass (lbs): 120.2 lbs Total Body Water (lbs): 83.4 lbs Visceral Fat Rating : 18   Other Clinical Data Fasting: no Labs: no Today's Visit #: 6 Starting Date: 05/26/23 Comments: Cat 3     ASSESSMENT AND PLAN:  Diet: Clem is currently in the action stage of change. As such, his goal is to continue with weight loss efforts and has agreed to the Category 3  Plan and keeping a food journal and adhering to recommended goals of 450-600 calories and 40 or more grams of protein daily.   Exercise:  Older adults should determine their level of effort for physical activity relative to their level of fitness.  Behavior Modification:  We discussed the following Behavioral Modification Strategies today: increasing lean protein intake, decreasing simple carbohydrates, increasing vegetables, meal planning and cooking strategies, keeping healthy foods in the home, and planning for success.   Return in about 4 weeks (around 11/04/2023).   He was informed of the importance of frequent follow up visits to maximize his success with intensive lifestyle modifications for his multiple health conditions.  Attestation Statements:   Reviewed by clinician on day of visit: allergies, medications, problem list, medical history, surgical history, family history, social history, and previous encounter notes.     Donaciano Frizzle, MD

## 2023-10-07 NOTE — Assessment & Plan Note (Signed)
 BP low today.  No dizziness or lightheadedness.  On amlodipine  and metoprolol  daily.  Can stop amlodipine  as BP has been on lower side.

## 2023-10-21 ENCOUNTER — Other Ambulatory Visit: Payer: Self-pay | Admitting: Family Medicine

## 2023-10-21 DIAGNOSIS — E7800 Pure hypercholesterolemia, unspecified: Secondary | ICD-10-CM

## 2023-10-21 DIAGNOSIS — I2510 Atherosclerotic heart disease of native coronary artery without angina pectoris: Secondary | ICD-10-CM

## 2023-11-07 ENCOUNTER — Other Ambulatory Visit: Payer: Self-pay | Admitting: Family Medicine

## 2023-11-07 DIAGNOSIS — G2581 Restless legs syndrome: Secondary | ICD-10-CM

## 2023-11-10 ENCOUNTER — Encounter (INDEPENDENT_AMBULATORY_CARE_PROVIDER_SITE_OTHER): Payer: Self-pay | Admitting: Family Medicine

## 2023-11-10 ENCOUNTER — Ambulatory Visit (INDEPENDENT_AMBULATORY_CARE_PROVIDER_SITE_OTHER): Admitting: Family Medicine

## 2023-11-10 VITALS — BP 108/52 | HR 58 | Temp 97.7°F | Ht 63.0 in | Wt 177.0 lb

## 2023-11-10 DIAGNOSIS — Z6831 Body mass index (BMI) 31.0-31.9, adult: Secondary | ICD-10-CM | POA: Diagnosis not present

## 2023-11-10 DIAGNOSIS — R7303 Prediabetes: Secondary | ICD-10-CM

## 2023-11-10 DIAGNOSIS — I10 Essential (primary) hypertension: Secondary | ICD-10-CM

## 2023-11-10 NOTE — Progress Notes (Signed)
   SUBJECTIVE:  Chief Complaint: Obesity  Interim History: Patient's wife just retired.  They are planning to do a bit of traveling and are contemplating a move back up [place].  Not sure where they will move to or when.  So far he mentions he has been doing well on his meal plan.  There were celebrations, retirement and a holiday that there was some eating off plan.  He is figuring how to be savvy with his ordering if eating out.  He doesn't have the same emotional reaction to eating out as he did previously.   Demontray is here to discuss his progress with his obesity treatment plan. He is on the Category 3 Plan and states he is following his eating plan approximately 90 % of the time. He states he is  walking and golfing 45 minutes for 5 times per week.   OBJECTIVE: Visit Diagnoses: Problem List Items Addressed This Visit       Cardiovascular and Mediastinum   Essential hypertension - Primary     Other   Prediabetes   Other Visit Diagnoses       BMI 31.0-31.9,adult         Morbid obesity (HCC)           Vitals Temp: 97.7 F (36.5 C) BP: (!) 108/52 Pulse Rate: (!) 58 SpO2: 98 %        ASSESSMENT AND PLAN: Assessment & Plan Essential hypertension Blood pressure borderline low today.  No chest pain, chest pressure or headache.  No lightheadedness or dizziness.  Continue to follow BP at subsequent appointments. Prediabetes Last A1c of 6.1 in January.  He has been working on limiting simple carbohydrates and focusing on protein intake.  Will need repeat labs in the next month or two with PCP or here. BMI 31.0-31.9,adult  Morbid obesity (HCC) Anthropometric Measurements Height: 5' 3 (1.6 m) Weight: 177 lb (80.3 kg) BMI (Calculated): 31.36 Weight at Last Visit: 181 lb Weight Lost Since Last Visit: 4 Weight Gained Since Last Visit: 0 Starting Weight: 214 lb Total Weight Loss (lbs): 37 lb (16.8 kg) Body Composition  Body Fat %: 29.7 % Fat Mass (lbs): 52.6  lbs Muscle Mass (lbs): 118.2 lbs Total Body Water (lbs): 84 lbs Visceral Fat Rating : 18 Other Clinical Data Fasting: no Labs: no Today's Visit #: 7 Starting Date: 05/26/23 Comments: Cat 3    Diet: Ulysses is currently in the action stage of change. As such, his goal is to continue with weight loss efforts and has agreed to the Category 3 Plan.   Exercise:  Older adults should determine their level of effort for physical activity relative to their level of fitness.  Behavior Modification:  We discussed the following Behavioral Modification Strategies today: increasing lean protein intake, meal planning and cooking strategies, keeping healthy foods in the home, and planning for success.   Return in about 8 weeks (around 01/05/2024).   He was informed of the importance of frequent follow up visits to maximize his success with intensive lifestyle modifications for his multiple health conditions.  Attestation Statements:   Reviewed by clinician on day of visit: allergies, medications, problem list, medical history, surgical history, family history, social history, and previous encounter notes.    Adelita Cho, MD

## 2023-11-16 NOTE — Assessment & Plan Note (Signed)
 Last A1c of 6.1 in January.  He has been working on limiting simple carbohydrates and focusing on protein intake.  Will need repeat labs in the next month or two with PCP or here.

## 2023-11-16 NOTE — Assessment & Plan Note (Signed)
 Blood pressure borderline low today.  No chest pain, chest pressure or headache.  No lightheadedness or dizziness.  Continue to follow BP at subsequent appointments.

## 2023-12-21 ENCOUNTER — Ambulatory Visit: Payer: Self-pay | Admitting: Family Medicine

## 2023-12-21 ENCOUNTER — Ambulatory Visit (INDEPENDENT_AMBULATORY_CARE_PROVIDER_SITE_OTHER): Payer: BC Managed Care – PPO | Admitting: Family Medicine

## 2023-12-21 ENCOUNTER — Encounter: Payer: Self-pay | Admitting: Family Medicine

## 2023-12-21 VITALS — BP 108/64 | HR 57 | Temp 97.0°F | Ht 63.0 in | Wt 173.4 lb

## 2023-12-21 DIAGNOSIS — E7800 Pure hypercholesterolemia, unspecified: Secondary | ICD-10-CM

## 2023-12-21 DIAGNOSIS — Z125 Encounter for screening for malignant neoplasm of prostate: Secondary | ICD-10-CM | POA: Diagnosis not present

## 2023-12-21 DIAGNOSIS — E538 Deficiency of other specified B group vitamins: Secondary | ICD-10-CM | POA: Diagnosis not present

## 2023-12-21 DIAGNOSIS — R7303 Prediabetes: Secondary | ICD-10-CM

## 2023-12-21 DIAGNOSIS — I10 Essential (primary) hypertension: Secondary | ICD-10-CM | POA: Diagnosis not present

## 2023-12-21 DIAGNOSIS — I2510 Atherosclerotic heart disease of native coronary artery without angina pectoris: Secondary | ICD-10-CM

## 2023-12-21 DIAGNOSIS — Z0000 Encounter for general adult medical examination without abnormal findings: Secondary | ICD-10-CM

## 2023-12-21 LAB — COMPREHENSIVE METABOLIC PANEL WITH GFR
ALT: 44 U/L (ref 0–53)
AST: 24 U/L (ref 0–37)
Albumin: 4.3 g/dL (ref 3.5–5.2)
Alkaline Phosphatase: 73 U/L (ref 39–117)
BUN: 20 mg/dL (ref 6–23)
CO2: 27 meq/L (ref 19–32)
Calcium: 9 mg/dL (ref 8.4–10.5)
Chloride: 103 meq/L (ref 96–112)
Creatinine, Ser: 1.05 mg/dL (ref 0.40–1.50)
GFR: 71.6 mL/min (ref >60.00)
Glucose, Bld: 97 mg/dL (ref 70–99)
Potassium: 4.3 meq/L (ref 3.5–5.1)
Sodium: 139 meq/L (ref 135–145)
Total Bilirubin: 0.7 mg/dL (ref 0.2–1.2)
Total Protein: 6.6 g/dL (ref 6.0–8.3)

## 2023-12-21 LAB — URINALYSIS, ROUTINE W REFLEX MICROSCOPIC
Hgb urine dipstick: NEGATIVE
Ketones, ur: NEGATIVE
Leukocytes,Ua: NEGATIVE
Nitrite: NEGATIVE
Specific Gravity, Urine: 1.02 (ref 1.000–1.030)
Total Protein, Urine: NEGATIVE
Urine Glucose: NEGATIVE
Urobilinogen, UA: 0.2 (ref 0.0–1.0)
pH: 6 (ref 5.0–8.0)

## 2023-12-21 LAB — LIPID PANEL
Cholesterol: 110 mg/dL (ref 0–200)
HDL: 31.5 mg/dL — ABNORMAL LOW (ref >39.00)
LDL Cholesterol: 52 mg/dL (ref 0–99)
NonHDL: 78.88
Total CHOL/HDL Ratio: 4
Triglycerides: 135 mg/dL (ref 0.0–149.0)
VLDL: 27 mg/dL (ref 0.0–40.0)

## 2023-12-21 LAB — HEMOGLOBIN A1C: Hgb A1c MFr Bld: 5.7 % (ref 4.6–6.5)

## 2023-12-21 LAB — CBC WITH DIFFERENTIAL/PLATELET
Basophils Absolute: 0.1 K/uL (ref 0.0–0.1)
Basophils Relative: 0.8 % (ref 0.0–3.0)
Eosinophils Absolute: 0.1 K/uL (ref 0.0–0.7)
Eosinophils Relative: 2.2 % (ref 0.0–5.0)
HCT: 47.4 % (ref 39.0–52.0)
Hemoglobin: 15.9 g/dL (ref 13.0–17.0)
Lymphocytes Relative: 21.9 % (ref 12.0–46.0)
Lymphs Abs: 1.4 K/uL (ref 0.7–4.0)
MCHC: 33.5 g/dL (ref 30.0–36.0)
MCV: 88 fl (ref 78.0–100.0)
Monocytes Absolute: 0.7 K/uL (ref 0.1–1.0)
Monocytes Relative: 11 % (ref 3.0–12.0)
Neutro Abs: 4.1 K/uL (ref 1.4–7.7)
Neutrophils Relative %: 64.1 % (ref 43.0–77.0)
Platelets: 152 K/uL (ref 150.0–400.0)
RBC: 5.39 Mil/uL (ref 4.22–5.81)
RDW: 14.1 % (ref 11.5–15.5)
WBC: 6.5 K/uL (ref 4.0–10.5)

## 2023-12-21 LAB — VITAMIN B12: Vitamin B-12: 197 pg/mL — ABNORMAL LOW (ref 211–911)

## 2023-12-21 LAB — PSA: PSA: 0.49 ng/mL (ref 0.10–4.00)

## 2023-12-21 MED ORDER — VITAMIN B-12 1000 MCG PO TABS: 1000.0000 ug | ORAL_TABLET | Freq: Every day | ORAL | 1 refills | Status: DC

## 2023-12-21 NOTE — Addendum Note (Signed)
 Addended by: BERNETA ELSIE LABOR on: 12/21/2023 03:10 PM   Modules accepted: Orders

## 2023-12-21 NOTE — Progress Notes (Addendum)
 Established Patient Office Visit   Subjective:  Patient ID: Justin Boyd, male    DOB: 11-17-1950  Age: 73 y.o. MRN: [id number]  Chief Complaint  Patient presents with   Annual Exam    CPE. Pt is fasting.     HPI Encounter Diagnoses  Name Primary?   Healthcare maintenance Yes   Screening for prostate cancer    Essential hypertension    Prediabetes    B12 deficiency    Elevated LDL cholesterol level    Atherosclerosis of native coronary artery of native heart without angina pectoris    For physical and follow-up of above.  Continues metoprolol  with higher dose rosuvastatin  with his history of coronary artery disease.  Exercise remains somewhat limited for him secondary to bullous emphysema.  However he is now able to walk some and play golf.  He and his wife are considering moving back up [place] to [place]  possibly.  Continues working with medical weight loss management and has been able to lose a few pounds.  Continue seeing pulmonology.   Review of Systems  Constitutional: Negative.   HENT: Negative.    Eyes:  Negative for blurred vision, discharge and redness.  Respiratory: Negative.    Cardiovascular: Negative.   Gastrointestinal:  Negative for abdominal pain.  Genitourinary: Negative.   Musculoskeletal: Negative.  Negative for myalgias.  Skin:  Negative for rash.  Neurological:  Negative for tingling, loss of consciousness and weakness.  Endo/Heme/Allergies:  Negative for polydipsia.      12/21/2023    8:54 AM 06/20/2022    8:03 AM 05/26/2022   11:39 AM  Depression screen PHQ 2/9  Decreased Interest 0 0 0  Down, Depressed, Hopeless 0 0 0  PHQ - 2 Score 0 0 0  Altered sleeping 0    Tired, decreased energy 0    Change in appetite 0    Feeling bad or failure about yourself  0    Trouble concentrating 0    Moving slowly or fidgety/restless 0    Suicidal thoughts 0    PHQ-9 Score 0    Difficult doing work/chores Not difficult at all        Current Outpatient  Medications:    albuterol  (VENTOLIN  HFA) 108 (90 Base) MCG/ACT inhaler, Inhale 2 puffs into the lungs every 4 (four) hours as needed for wheezing or shortness of breath., Disp: 1 each, Rfl: 5   cyanocobalamin  (VITAMIN B12) 1000 MCG tablet, Take 1 tablet (1,000 mcg total) by mouth daily., Disp: 90 tablet, Rfl: 1   Fluticasone -Umeclidin-Vilant (TRELEGY ELLIPTA ) 200-62.5-25 MCG/ACT AEPB, Inhale 1 puff into the lungs daily., Disp: 180 each, Rfl: 3   gabapentin  (NEURONTIN ) 400 MG capsule, TAKE 1 CAPSULE(400 MG) BY MOUTH THREE TIMES DAILY., Disp: 270 capsule, Rfl: 3   metFORMIN  (GLUCOPHAGE -XR) 500 MG 24 hr tablet, TAKE 1 TABLET(500 MG) BY MOUTH AT BEDTIME, Disp: 90 tablet, Rfl: 1   metoprolol  succinate (TOPROL -XL) 50 MG 24 hr tablet, TAKE 1 TABLET(50 MG) BY MOUTH DAILY WITH OR IMMEDIATELY FOLLOWING A MEAL, Disp: 90 tablet, Rfl: 4   nitroGLYCERIN  (NITROSTAT ) 0.4 MG SL tablet, Place 1 tablet (0.4 mg total) under the tongue every 5 (five) minutes as needed for chest pain., Disp: 25 tablet, Rfl: 6   pantoprazole  (PROTONIX ) 40 MG tablet, Take 1 tablet (40 mg total) by mouth daily., Disp: 90 tablet, Rfl: 3   rosuvastatin  (CRESTOR ) 40 MG tablet, TAKE 1 TABLET(40 MG) BY MOUTH DAILY, Disp: 90 tablet, Rfl: 3   Objective:  BP 108/64 (BP Location: Right Arm, Patient Position: Sitting, Cuff Size: Normal)   Pulse (!) 57   Temp (!) 97 F (36.1 C) (Temporal)   Ht 5' 3 (1.6 m)   Wt 173 lb 6.4 oz (78.7 kg)   SpO2 98%   BMI 30.72 kg/m  Wt Readings from Last 3 Encounters:  12/21/23 173 lb 6.4 oz (78.7 kg)  11/10/23 177 lb (80.3 kg)  10/07/23 181 lb (82.1 kg)      Physical Exam Constitutional:      General: He is not in acute distress.    Appearance: Normal appearance. He is not ill-appearing, toxic-appearing or diaphoretic.  HENT:     Head: Normocephalic and atraumatic.     Right Ear: Tympanic membrane, ear canal and external ear normal.     Left Ear: Tympanic membrane, ear canal and external ear  normal.     Mouth/Throat:     Mouth: Mucous membranes are moist.     Pharynx: Oropharynx is clear. No oropharyngeal exudate or posterior oropharyngeal erythema.  Eyes:     General: No scleral icterus.       Right eye: No discharge.        Left eye: No discharge.     Extraocular Movements: Extraocular movements intact.     Conjunctiva/sclera: Conjunctivae normal.     Pupils: Pupils are equal, round, and reactive to light.  Cardiovascular:     Rate and Rhythm: Normal rate and regular rhythm.  Pulmonary:     Effort: Pulmonary effort is normal. No respiratory distress.     Breath sounds: Normal breath sounds.  Abdominal:     General: Bowel sounds are normal.     Tenderness: There is no abdominal tenderness. There is no guarding.     Hernia: There is no hernia in the left inguinal area or right inguinal area.  Genitourinary:    Penis: Circumcised. No hypospadias, erythema, tenderness, discharge, swelling or lesions.      Testes:        Right: Mass, tenderness or swelling not present. Right testis is descended.        Left: Mass, tenderness or swelling not present. Left testis is descended.     Epididymis:     Right: Not inflamed or enlarged.     Left: Not inflamed or enlarged.  Musculoskeletal:     Cervical back: No rigidity or tenderness.  Lymphadenopathy:     Lower Body: No right inguinal adenopathy. No left inguinal adenopathy.  Skin:    General: Skin is warm and dry.  Neurological:     Mental Status: He is alert and oriented to person, place, and time.  Psychiatric:        Mood and Affect: Mood normal.        Behavior: Behavior normal.      Results for orders placed or performed in visit on 12/21/23  CBC with Differential/Platelet  Result Value Ref Range   WBC 6.5 4.0 - 10.5 K/uL   RBC 5.39 4.22 - 5.81 Mil/uL   Hemoglobin 15.9 13.0 - 17.0 g/dL   HCT 52.5 60.9 - 47.9 %   MCV 88.0 78.0 - 100.0 fl   MCHC 33.5 30.0 - 36.0 g/dL   RDW 85.8 88.4 - 84.4 %   Platelets 152.0  150.0 - 400.0 K/uL   Neutrophils Relative % 64.1 43.0 - 77.0 %   Lymphocytes Relative 21.9 12.0 - 46.0 %   Monocytes Relative 11.0 3.0 - 12.0 %   Eosinophils  Relative 2.2 0.0 - 5.0 %   Basophils Relative 0.8 0.0 - 3.0 %   Neutro Abs 4.1 1.4 - 7.7 K/uL   Lymphs Abs 1.4 0.7 - 4.0 K/uL   Monocytes Absolute 0.7 0.1 - 1.0 K/uL   Eosinophils Absolute 0.1 0.0 - 0.7 K/uL   Basophils Absolute 0.1 0.0 - 0.1 K/uL  Comprehensive metabolic panel with GFR  Result Value Ref Range   Sodium 139 135 - 145 mEq/L   Potassium 4.3 3.5 - 5.1 mEq/L   Chloride 103 96 - 112 mEq/L   CO2 27 19 - 32 mEq/L   Glucose, Bld 97 70 - 99 mg/dL   BUN 20 6 - 23 mg/dL   Creatinine, Ser 8.94 0.40 - 1.50 mg/dL   Total Bilirubin 0.7 0.2 - 1.2 mg/dL   Alkaline Phosphatase 73 39 - 117 U/L   AST 24 0 - 37 U/L   ALT 44 0 - 53 U/L   Total Protein 6.6 6.0 - 8.3 g/dL   Albumin 4.3 3.5 - 5.2 g/dL   GFR 28.39 >39.99 mL/min   Calcium  9.0 8.4 - 10.5 mg/dL  Lipid panel  Result Value Ref Range   Cholesterol 110 0 - 200 mg/dL   Triglycerides 864.9 0.0 - 149.0 mg/dL   HDL 68.49 (L) >60.99 mg/dL   VLDL 72.9 0.0 - 59.9 mg/dL   LDL Cholesterol 52 0 - 99 mg/dL   Total CHOL/HDL Ratio 4    NonHDL 78.88   PSA  Result Value Ref Range   PSA 0.49 0.10 - 4.00 ng/mL  Hemoglobin A1c  Result Value Ref Range   Hgb A1c MFr Bld 5.7 4.6 - 6.5 %  Vitamin B12  Result Value Ref Range   Vitamin B-12 197 (L) 211 - 911 pg/mL      The ASCVD Risk score (Arnett DK, et al., 2019) failed to calculate for the following reasons:   The valid total cholesterol range is 130 to 320 mg/dL    Assessment & Plan:   Healthcare maintenance  Screening for prostate cancer -     PSA  Essential hypertension -     Comprehensive metabolic panel with GFR -     Urinalysis, Routine w reflex microscopic  Prediabetes -     Comprehensive metabolic panel with GFR -     Hemoglobin A1c  B12 deficiency -     CBC with Differential/Platelet -     Vitamin  B12 -     Vitamin B-12; Take 1 tablet (1,000 mcg total) by mouth daily.  Dispense: 90 tablet; Refill: 1  Elevated LDL cholesterol level -     Comprehensive metabolic panel with GFR -     Lipid panel  Atherosclerosis of native coronary artery of native heart without angina pectoris -     Lipid panel    Return in about 6 months (around 06/22/2024).  Continue current medications.  Adjustments made pending results of labs.  May need supplemental B12.  Information was given on health maintenance and disease prevention.  Continue working with medical weight loss management.  Elsie Sim Lent, MD

## 2023-12-30 ENCOUNTER — Encounter (INDEPENDENT_AMBULATORY_CARE_PROVIDER_SITE_OTHER): Payer: Self-pay | Admitting: Family Medicine

## 2023-12-31 NOTE — Telephone Encounter (Signed)
 Appt 01/05/24, Labs 12/21/23

## 2023-12-31 NOTE — Telephone Encounter (Signed)
 See labs

## 2024-01-05 ENCOUNTER — Encounter (INDEPENDENT_AMBULATORY_CARE_PROVIDER_SITE_OTHER): Payer: Self-pay | Admitting: Family Medicine

## 2024-01-05 ENCOUNTER — Ambulatory Visit (INDEPENDENT_AMBULATORY_CARE_PROVIDER_SITE_OTHER): Payer: Self-pay | Admitting: Family Medicine

## 2024-01-05 VITALS — BP 123/68 | HR 54 | Temp 97.9°F | Ht 63.0 in | Wt 169.0 lb

## 2024-01-05 DIAGNOSIS — Z6829 Body mass index (BMI) 29.0-29.9, adult: Secondary | ICD-10-CM

## 2024-01-05 DIAGNOSIS — E538 Deficiency of other specified B group vitamins: Secondary | ICD-10-CM | POA: Diagnosis not present

## 2024-01-05 NOTE — Progress Notes (Signed)
   SUBJECTIVE:  Chief Complaint: Obesity  Interim History: Patient had a good summer- played quite a bit of golf.  He joined a group of like minded gentlemen and plays golf with them (he is the youngest of the group).  Leaving on September 30th [place] 13th [place] community. He is planning a trip to [facility] for a few days as well.  Has been able to stay consistent on the Category 3 and has found this to have just become part of his routine.  He does the grocery shopping and mentions that indulgent food doesn't make its way into his house.  Is finding Wheaties Protein is getting more difficult to find.  He is doing some walking and yardwork.  Dorin is here to discuss his progress with his obesity treatment plan. He is on the Category 3 Plan and states he is following his eating plan approximately 90 % of the time. He states he is exercising 60 minutes - 4 hours 5 times per week.   OBJECTIVE: Visit Diagnoses: Problem List Items Addressed This Visit   None   Vitals Temp: 97.9 F (36.6 C) BP: 123/68 Pulse Rate: (!) 54 SpO2: 96 %   Anthropometric Measurements Height: 5' 3 (1.6 m) Weight: 169 lb (76.7 kg) BMI (Calculated): 29.94 Weight at Last Visit: 177 lb Weight Lost Since Last Visit: 8 Weight Gained Since Last Visit: 0 Starting Weight: 214 lb Total Weight Loss (lbs): 45 lb (20.4 kg)   Body Composition  Body Fat %: 28.2 % Fat Mass (lbs): 47.8 lbs Muscle Mass (lbs): 115.6 lbs Total Body Water (lbs): 81.2 lbs Visceral Fat Rating : 17   Other Clinical Data Today's Visit #: 8 Starting Date: 05/26/23 Comments: Cat 3     ASSESSMENT AND PLAN: Assessment & Plan B12 deficiency Patient vitamin B12 level found to be low on labs done in August of this year.  Lab even lower than prior labs 7 months ago.  Patient reports he is now taking significant B12 supplementation to help with his B12  deficiency.  No changes seen on CBC in [facility]. Morbid obesity (HCC)  BMI 29.0-29.9,adult    Diet: Armany is currently in the action stage of change. As such, his goal is to continue with weight loss efforts and has agreed to the Category 3 Plan.   Exercise:  Older adults should determine their level of effort for physical activity relative to their level of fitness.  Behavior Modification:  We discussed the following Behavioral Modification Strategies today: increasing lean protein intake, decreasing simple carbohydrates, increasing vegetables, meal planning and cooking strategies, avoiding temptations, and planning for success.   Return in about 8 weeks (around 03/01/2024).   He was informed of the importance of frequent follow up visits to maximize his success with intensive lifestyle modifications for his multiple health conditions.  Attestation Statements:   Reviewed by clinician on day of visit: allergies, medications, problem list, medical history, surgical history, family history, social history, and previous encounter notes.     Adelita Cho, MD

## 2024-01-10 NOTE — Assessment & Plan Note (Signed)
 Patient vitamin B12 level found to be low on labs done in August of this year.  Lab even lower than prior labs 7 months ago.  Patient reports he is now taking significant B12 supplementation to help with his B12 deficiency.  No changes seen on CBC in [facility].

## 2024-01-14 ENCOUNTER — Encounter: Payer: Self-pay | Admitting: Family Medicine

## 2024-01-15 ENCOUNTER — Other Ambulatory Visit: Payer: Self-pay | Admitting: Nurse Practitioner

## 2024-01-15 MED ORDER — COVID-19 MRNA VAC-TRIS(PFIZER) 30 MCG/0.3ML IM SUSY: 0.3000 mL | PREFILLED_SYRINGE | Freq: Once | INTRAMUSCULAR | 0 refills | Status: AC

## 2024-01-21 DIAGNOSIS — K080 Exfoliation of teeth due to systemic causes: Secondary | ICD-10-CM | POA: Diagnosis not present

## 2024-01-28 DIAGNOSIS — K080 Exfoliation of teeth due to systemic causes: Secondary | ICD-10-CM | POA: Diagnosis not present

## 2024-02-05 ENCOUNTER — Other Ambulatory Visit: Payer: Self-pay | Admitting: Family Medicine

## 2024-02-05 DIAGNOSIS — I2510 Atherosclerotic heart disease of native coronary artery without angina pectoris: Secondary | ICD-10-CM

## 2024-02-05 DIAGNOSIS — I10 Essential (primary) hypertension: Secondary | ICD-10-CM

## 2024-03-03 ENCOUNTER — Encounter (INDEPENDENT_AMBULATORY_CARE_PROVIDER_SITE_OTHER): Payer: Self-pay | Admitting: Family Medicine

## 2024-03-03 ENCOUNTER — Ambulatory Visit (INDEPENDENT_AMBULATORY_CARE_PROVIDER_SITE_OTHER): Admitting: Family Medicine

## 2024-03-03 VITALS — BP 107/67 | HR 59 | Temp 97.9°F | Ht 63.0 in | Wt 164.0 lb

## 2024-03-03 DIAGNOSIS — Z6829 Body mass index (BMI) 29.0-29.9, adult: Secondary | ICD-10-CM | POA: Diagnosis not present

## 2024-03-03 DIAGNOSIS — I10 Essential (primary) hypertension: Secondary | ICD-10-CM | POA: Diagnosis not present

## 2024-03-03 NOTE — Progress Notes (Signed)
   SUBJECTIVE:  Chief Complaint: Obesity  Interim History: Patient here for 2 month follow up.  He went on vacation for 2 weeks to [place] and brought his scale with him.  Wheaties protein was discontinued and he is looking for last boxes on the shelves.  He is experiencing nausea at the end of the day and is attributing this to metformin .  He and his wife are contemplating moving back to [place] close to their hometowns.  Patient cooks for Thanksgiving and makes traditional Thanksgiving food.  No plans for December holidays.   Brazos is here to discuss his progress with his obesity treatment plan. He is on the Category 3 Plan and states he is following his eating plan approximately 80 % of the time. He states he is played golf and walked on the beach 45-4 hours minutes 6 times per week.   OBJECTIVE: Visit Diagnoses: Problem List Items Addressed This Visit   None   Vitals Temp: 97.9 F (36.6 C) BP: 107/67 Pulse Rate: (!) 59 SpO2: 95 %   Anthropometric Measurements Height: 5' 3 (1.6 m) Weight: 164 lb (74.4 kg) BMI (Calculated): 29.06 Weight at Last Visit: 169 lb Weight Lost Since Last Visit: 5 Weight Gained Since Last Visit: 0 Starting Weight: 214 lb Total Weight Loss (lbs): 50 lb (22.7 kg)   Body Composition  Body Fat %: 26.8 % Fat Mass (lbs): 44.2 lbs Muscle Mass (lbs): 114.4 lbs Total Body Water (lbs): 79.8 lbs Visceral Fat Rating : 16   Other Clinical Data Fasting: NO Labs: NO Today's Visit #: 9 Starting Date: 05/26/23 Comments: Cat 3     ASSESSMENT AND PLAN: Assessment & Plan Essential hypertension Blood pressure extremely well-controlled today.  No chest pain, chest pressure, or headache.  No dizziness or lightheadedness.  Patient needs to reach out to cardiology if he experiences any of the symptoms given his blood pressure medication cocktail. Morbid obesity (HCC)  BMI 29.0-29.9,adult   Diet: Chayden is currently in the action stage of change. As such, his  goal is to continue with weight loss efforts and has agreed to the Category 3 Plan.   Exercise:  Older adults should determine their level of effort for physical activity relative to their level of fitness.  Behavior Modification:  We discussed the following Behavioral Modification Strategies today: increasing lean protein intake, decreasing simple carbohydrates, increasing vegetables, meal planning and cooking strategies, and planning for success.   No follow-ups on file.   He was informed of the importance of frequent follow up visits to maximize his success with intensive lifestyle modifications for his multiple health conditions.  Attestation Statements:   Reviewed by clinician on day of visit: allergies, medications, problem list, medical history, surgical history, family history, social history, and previous encounter notes.     Adelita Cho, MD

## 2024-03-10 NOTE — Assessment & Plan Note (Signed)
 Blood pressure extremely well-controlled today.  No chest pain, chest pressure, or headache.  No dizziness or lightheadedness.  Patient needs to reach out to cardiology if he experiences any of the symptoms given his blood pressure medication cocktail.

## 2024-03-18 DIAGNOSIS — H5203 Hypermetropia, bilateral: Secondary | ICD-10-CM | POA: Diagnosis not present

## 2024-04-11 ENCOUNTER — Telehealth: Payer: Self-pay | Admitting: Family Medicine

## 2024-04-11 DIAGNOSIS — K080 Exfoliation of teeth due to systemic causes: Secondary | ICD-10-CM | POA: Diagnosis not present

## 2024-04-11 NOTE — Telephone Encounter (Signed)
 Copied from CRM [id number]. Topic: General - Call Back - No Documentation >> Apr 11, 2024  3:01 PM Dedra B wrote: Reason for CRM: Pt said he received a call asking him to confirm his address. He confirmed that the address on file is correct. Did not see documentation of call.

## 2024-05-02 ENCOUNTER — Other Ambulatory Visit: Payer: Self-pay | Admitting: Pulmonary Disease

## 2024-05-02 NOTE — Telephone Encounter (Unsigned)
 Copied from CRM #[id number]. Topic: Clinical - Medication Refill >> May 02, 2024 10:08 AM Isabell A wrote: Medication: Fluticasone -Umeclidin-Vilant (TRELEGY ELLIPTA ) 200-62.5-25 MCG/ACT AEPB [[id number]]  Has the patient contacted their pharmacy? No (Agent: If no, request that the patient contact the pharmacy for the refill. If patient does not wish to contact the pharmacy document the reason why and proceed with request.) (Agent: If yes, when and what did the pharmacy advise?)  This is the patient's preferred pharmacy:  [id number] - [place], [place] - [address] AT [facility] [address] [facility] [address] [place] [address] Phone: [phone] Fax: [phone]  Is this the correct pharmacy for this prescription? Yes If no, delete pharmacy and type the correct one.   Has the prescription been filled recently? Yes  Is the patient out of the medication? Yes  Has the patient been seen for an appointment in the last year OR does the patient have an upcoming appointment? Yes  Can we respond through MyChart? No  Agent: Please be advised that Rx refills may take up to 3 business days. We ask that you follow-up with your pharmacy.    Patient is requesting for this to be filled before the year ends, after it will go up to $600.

## 2024-05-03 MED ORDER — TRELEGY ELLIPTA 200-62.5-25 MCG/ACT IN AEPB: 1.0000 | INHALATION_SPRAY | Freq: Every day | RESPIRATORY_TRACT | 3 refills | Status: AC

## 2024-05-25 ENCOUNTER — Ambulatory Visit (HOSPITAL_BASED_OUTPATIENT_CLINIC_OR_DEPARTMENT_OTHER)
Admission: RE | Admit: 2024-05-25 | Discharge: 2024-05-25 | Disposition: A | Discharge status: Home / Self Care | Source: Ambulatory Visit | Attending: Acute Care | Admitting: Acute Care

## 2024-05-25 DIAGNOSIS — Z87891 Personal history of nicotine dependence: Secondary | ICD-10-CM | POA: Insufficient documentation

## 2024-05-25 DIAGNOSIS — Z122 Encounter for screening for malignant neoplasm of respiratory organs: Secondary | ICD-10-CM | POA: Diagnosis present

## 2024-05-26 ENCOUNTER — Ambulatory Visit (INDEPENDENT_AMBULATORY_CARE_PROVIDER_SITE_OTHER): Admitting: Family Medicine

## 2024-05-26 VITALS — BP 109/62 | HR 59 | Temp 97.6°F | Ht 63.0 in | Wt 165.0 lb

## 2024-05-26 DIAGNOSIS — E7800 Pure hypercholesterolemia, unspecified: Secondary | ICD-10-CM

## 2024-05-26 DIAGNOSIS — Z6829 Body mass index (BMI) 29.0-29.9, adult: Secondary | ICD-10-CM | POA: Diagnosis not present

## 2024-05-26 NOTE — Progress Notes (Signed)
" ° °  SUBJECTIVE:  Chief Complaint: Obesity  Interim History: Patient mentions since his last appointment he has been trying to stay focused on his intake and activity.  He is playing golf 2x a week and he is playing with folks of his age.  He is enjoying staying at home and he and his wife have incorporated bake at home pastries occasionally.  He is wondering about incorporating bananas occasionally into his intake.  Upon further discussion he is making some substitutions at lunch.  Justin Boyd is here to discuss his progress with his obesity treatment plan. He is on the Category 3 Plan and states he is following his eating plan approximately 80 % of the time. He states he is playing golf 4 hours minutes 2 times per week.   OBJECTIVE: Visit Diagnoses: Problem List Items Addressed This Visit       Other   Elevated LDL cholesterol level - Primary   Other Visit Diagnoses       BMI 29.0-29.9,adult         Morbid obesity (HCC)           Vitals Temp: 97.6 F (36.4 C) BP: 109/62 Pulse Rate: (!) 59 SpO2: 95 %   Anthropometric Measurements Height: 5' 3 (1.6 m) Weight: 165 lb (74.8 kg) BMI (Calculated): 29.24 Weight at Last Visit: 164 lb Weight Lost Since Last Visit: 0 Weight Gained Since Last Visit: 1 Starting Weight: 214 lb Total Weight Loss (lbs): 49 lb (22.2 kg)   Body Composition  Body Fat %: 26.2 % Fat Mass (lbs): 43.2 lbs Muscle Mass (lbs): 115.6 lbs Total Body Water (lbs): 79 lbs Visceral Fat Rating : 16   Other Clinical Data Fasting: no Labs: no Today's Visit #: 10 Starting Date: 05/25/21 Comments: Cat 3     ASSESSMENT AND PLAN: Assessment & Plan Elevated LDL cholesterol level Previously elevated LDL but now at goal on recent labs from August 2025.  Patient continues on current treatment plan and is still being extremely mindful of dietary intake and consistency of his physical activity.  No change in current treatment plan. BMI 29.0-29.9,adult  Morbid  obesity (HCC)    Diet: Justin Boyd is currently in the action stage of change. As such, his goal is to continue with weight loss efforts and has agreed to the Category 3 Plan. Patient to ensure 350-450 calories and 30 or more grams of protein at lunch.  Exercise:  Older adults should determine their level of effort for physical activity relative to their level of fitness.  Patient encouraged to ensure consistent activity with high enough resistance to ensure maintenance of muscle mass.  Behavior Modification:  We discussed the following Behavioral Modification Strategies today: increasing lean protein intake, decreasing simple carbohydrates, increasing vegetables, meal planning and cooking strategies, keeping healthy foods in the home, and planning for success.   Return in about 3 months (around 08/24/2024).   He was informed of the importance of frequent follow up visits to maximize his success with intensive lifestyle modifications for his multiple health conditions.  Attestation Statements:   Reviewed by clinician on day of visit: allergies, medications, problem list, medical history, surgical history, family history, social history, and previous encounter notes.     Adelita Cho, MD "

## 2024-05-31 ENCOUNTER — Other Ambulatory Visit: Payer: Self-pay

## 2024-05-31 DIAGNOSIS — Z87891 Personal history of nicotine dependence: Secondary | ICD-10-CM

## 2024-05-31 DIAGNOSIS — Z122 Encounter for screening for malignant neoplasm of respiratory organs: Secondary | ICD-10-CM

## 2024-06-01 ENCOUNTER — Encounter (INDEPENDENT_AMBULATORY_CARE_PROVIDER_SITE_OTHER): Payer: Self-pay | Admitting: Family Medicine

## 2024-06-01 NOTE — Telephone Encounter (Signed)
 Please review

## 2024-06-05 NOTE — Assessment & Plan Note (Signed)
 Previously elevated LDL but now at goal on recent labs from August 2025.  Patient continues on current treatment plan and is still being extremely mindful of dietary intake and consistency of his physical activity.  No change in current treatment plan.

## 2024-06-23 ENCOUNTER — Ambulatory Visit: Admitting: Family Medicine

## 2024-08-18 ENCOUNTER — Ambulatory Visit (INDEPENDENT_AMBULATORY_CARE_PROVIDER_SITE_OTHER): Admitting: Family Medicine
